# Patient Record
Sex: Female | Born: 1945 | Race: Black or African American | Hispanic: No | Smoking: Former smoker
Health system: Southern US, Community
[De-identification: ages and names within clinical notes are randomized; demographics above are authoritative.]

## PROBLEM LIST (undated history)

## (undated) DIAGNOSIS — E782 Mixed hyperlipidemia: Secondary | ICD-10-CM

## (undated) DIAGNOSIS — R55 Syncope and collapse: Secondary | ICD-10-CM

## (undated) DIAGNOSIS — M109 Gout, unspecified: Secondary | ICD-10-CM

## (undated) DIAGNOSIS — E1165 Type 2 diabetes mellitus with hyperglycemia: Secondary | ICD-10-CM

## (undated) DIAGNOSIS — IMO0001 Reserved for inherently not codable concepts without codable children: Secondary | ICD-10-CM

## (undated) DIAGNOSIS — I10 Essential (primary) hypertension: Secondary | ICD-10-CM

## (undated) DIAGNOSIS — I639 Cerebral infarction, unspecified: Secondary | ICD-10-CM

## (undated) HISTORY — DX: Essential (primary) hypertension: I10

## (undated) HISTORY — DX: Type 2 diabetes mellitus with hyperglycemia: E11.65

## (undated) HISTORY — DX: Mixed hyperlipidemia: E78.2

## (undated) HISTORY — PX: ABDOMINAL HYSTERECTOMY: SHX81

## (undated) HISTORY — DX: Reserved for inherently not codable concepts without codable children: IMO0001

## (undated) HISTORY — PX: TONSILLECTOMY AND ADENOIDECTOMY: SUR1326

## (undated) HISTORY — DX: Syncope and collapse: R55

## (undated) HISTORY — DX: Gout, unspecified: M10.9

---

## 1998-01-10 ENCOUNTER — Encounter: Admission: RE | Admit: 1998-01-10 | Discharge: 1998-04-10 | Payer: Self-pay | Admitting: Family Medicine

## 1999-09-11 ENCOUNTER — Encounter: Payer: Self-pay | Admitting: Orthopaedic Surgery

## 1999-09-11 ENCOUNTER — Ambulatory Visit (HOSPITAL_COMMUNITY): Admission: RE | Admit: 1999-09-11 | Discharge: 1999-09-11 | Payer: Self-pay | Admitting: Orthopaedic Surgery

## 2004-11-01 ENCOUNTER — Other Ambulatory Visit: Admission: RE | Admit: 2004-11-01 | Discharge: 2004-11-01 | Payer: Self-pay | Admitting: Family Medicine

## 2005-12-04 ENCOUNTER — Emergency Department (HOSPITAL_COMMUNITY): Admission: EM | Admit: 2005-12-04 | Discharge: 2005-12-04 | Payer: Self-pay | Admitting: Family Medicine

## 2010-12-12 ENCOUNTER — Emergency Department: Payer: Self-pay | Admitting: Emergency Medicine

## 2012-01-02 ENCOUNTER — Emergency Department: Payer: Self-pay | Admitting: Emergency Medicine

## 2012-01-02 LAB — BASIC METABOLIC PANEL
Anion Gap: 9 (ref 7–16)
BUN: 51 mg/dL — ABNORMAL HIGH (ref 7–18)
Chloride: 102 mmol/L (ref 98–107)
Co2: 25 mmol/L (ref 21–32)
Creatinine: 2.24 mg/dL — ABNORMAL HIGH (ref 0.60–1.30)
EGFR (African American): 26 — ABNORMAL LOW
EGFR (Non-African Amer.): 23 — ABNORMAL LOW
Osmolality: 290 (ref 275–301)
Potassium: 3.6 mmol/L (ref 3.5–5.1)
Sodium: 136 mmol/L (ref 136–145)

## 2012-01-02 LAB — CBC WITH DIFFERENTIAL/PLATELET
Basophil %: 1.2 %
Eosinophil %: 0.5 %
HGB: 11.1 g/dL — ABNORMAL LOW (ref 12.0–16.0)
Lymphocyte %: 20.6 %
MCH: 26.6 pg (ref 26.0–34.0)
MCHC: 33.3 g/dL (ref 32.0–36.0)
MCV: 80 fL (ref 80–100)
Monocyte #: 0.9 x10 3/mm (ref 0.2–0.9)
Monocyte %: 9.4 %
Neutrophil %: 68.3 %
Platelet: 309 10*3/uL (ref 150–440)
RBC: 4.17 10*6/uL (ref 3.80–5.20)
RDW: 14.5 % (ref 11.5–14.5)
WBC: 9.5 10*3/uL (ref 3.6–11.0)

## 2013-06-22 ENCOUNTER — Encounter: Payer: Self-pay | Admitting: *Deleted

## 2013-06-23 ENCOUNTER — Encounter: Payer: Self-pay | Admitting: Cardiovascular Disease

## 2013-06-23 ENCOUNTER — Ambulatory Visit (INDEPENDENT_AMBULATORY_CARE_PROVIDER_SITE_OTHER): Payer: BC Managed Care – PPO | Admitting: Cardiovascular Disease

## 2013-06-23 VITALS — BP 138/82 | HR 87 | Ht 62.0 in | Wt 228.5 lb

## 2013-06-23 DIAGNOSIS — E119 Type 2 diabetes mellitus without complications: Secondary | ICD-10-CM

## 2013-06-23 DIAGNOSIS — R9431 Abnormal electrocardiogram [ECG] [EKG]: Secondary | ICD-10-CM

## 2013-06-23 DIAGNOSIS — E785 Hyperlipidemia, unspecified: Secondary | ICD-10-CM

## 2013-06-23 DIAGNOSIS — I10 Essential (primary) hypertension: Secondary | ICD-10-CM

## 2013-06-23 NOTE — Assessment & Plan Note (Signed)
Emily [name] presents today for further evaluation EKG. EKG in the office is unremarkable. She has some poor R-wave progression but I suspect it is due to lead placement. She also has some evidence of left ventricular hypertrophy. This is most likely related to her long-standing hypertension and also for obesity.  She has numerous risk factors for coronary artery disease including obesity, hypertension, hyperlipidemia, and diabetes mellitus. She has a hemoglobin A1c of 8.8.  We had a  Long discussion about improving her diet and starting an exercise program.  I've given her information on the [facility] low glycemic index diet. I've advised her to stay away from fast foods. We talked about numerous substitutions. I've advised her to stay away from eating anything is white, weak, or sweet.     At this point she does not have any symptoms so have not recommended that we pursue any further testing. Clinically I suspect that she has left ventricular hypertrophy although an echocardiogram to confirm that. At this point, given her lack of symptoms, I do not think that an echocardiogram as needed.

## 2013-06-23 NOTE — Assessment & Plan Note (Signed)
She has an LDL cholesterol of 128.  I've recommended that she start a statin medication. The least expensive statin his simvastatin but she would only be able to take 20 mg of simvastatin since she's currently on diltiazem. She may be up to take atorvastatin which is now generic. I think that an improved diet and weight loss we'll do to help with her hyperlipidemia. She admits to eating a lot of fast foods and fried foods.  I will leave the decision of whether or not to initiate a statin with her medical doctor.

## 2013-06-23 NOTE — Assessment & Plan Note (Signed)
I've recommended that she greatly diminish her salt intake. Further recommendations will be per her medical Dr.

## 2013-06-23 NOTE — Patient Instructions (Addendum)
°  [facility] Low Glycemic Diet °(Source: [facility], 2006) °Low Glycemic Foods (20-49) °(Decrease risk of developing heart disease) °Breakfast Cereals: °All-Bran All-Bran Fruit ‘n Oats °Fiber One Oatmeal (not instant) Oat bran °Fruits and fruit juices: (Limit to 1-2 servings per day) °Apples Apricots (fresh & dried) °Blackberries Blueberries °Cherries Cranberries Peaches Pears Plums Prunes °Grapefruit Raspberries Strawberries Tangerine °Apple juice Grapefruit juice °Tomato juice °Beans and legumes (fresh-cooked): °Black-eyed peas Butter beans °Chick peas Lentils  °Green beans Lima beans Kidney beans Navy beans °Pinto beans Snow peas °Non-starchy vegetables: °Asparagus, avocado, broccoli, cabbage, cauliflower, celery, cucumber, greens, lettuce, mushrooms, peppers, tomatoes, okra, onions, spinach, summer squash °Grains: °Barley Bulgur °Rye Wild rice °Nuts and oils : °Almonds Peanuts Sunflower seeds Hazelnuts Pecans Walnuts °Oils that are liquid at room temperature °Dairy, fish, meat, soy, and eggs: °Milk, skim Lowfat cheese °Yogurt, lowfat, fruit sugar sweetened °Lean red meat Fish  °Skinless chicken & turkey Shellfish °Egg whites (up to 3 daily) Soy products  °Egg yolks (up to 7 or _____ per week) Moderate Glycemic Foods (50-69) °Breakfast Cereals: °Bran Buds Bran Chex °Just Right Mini-Wheats  °Special K Swiss muesli °Fruits: °Banana (under-ripe) Dates °Figs Grapes °Kiwi Mango °Oranges Raisins °Fruit Juices: °Cranberry juice Orange juice °Beans and legumes: °Boston-type baked beans °Canned pinto, kidney, or navy beans °Green peas °Vegetables: °Beets Carrots  °Sweet potato Yam °Corn on the cob °Breads: °Pita (pocket) bread Oat bran bread °Pumpernickel bread Rye bread °Wheat bread, high fiber  °Grains: °Cornmeal Rice, brown Rice, white Couscous °Pasta: °Macaroni Pizza, cheese Ravioli, meat filled Spaghetti, white  °Nuts: °Cashews Macadamia °Snacks: °Chocolate Ice cream, lowfat Muffin  Popcorn High Glycemic Foods (70-100) ° °Breakfast Cereals: °Cheerios Corn Chex °Corn Flakes Cream of Wheat °Grape Nuts Grape Nut Flakes °Grits Nutri-Grain °Puffed Rice Puffed Wheat °Rice Chex Rice Krispies °Shredded Wheat Team °Total °Fruits: °Pineapple Watermelon °Banana (over-ripe) °Beverages: °Sodas, sweet tea, pineapple juice °Vegetables: °Potato, baked, boiled, fried, mashed °French fries °Canned or frozen corn °Parsnips °Winter squash °Breads: °Most breads (white and whole grain) °Bagels Bread sticks °Bread stuffing Kaiser roll °Dinner rolls °Grains: °Rice, instant Tapioca, with milk °Candy and most cookies °Snacks: °Donuts Corn chips Jelly beans Pretzels °Pastries  ° ° ° °

## 2013-06-23 NOTE — Progress Notes (Signed)
     Emily [name] Date of Birth  06/15/1946       [facility] Office [address]  [address] [place], [place]  [address]   [place], [place]  [address] [phone]     [phone]   Fax  [phone]    Fax [phone]  Problem List: 1. Hypertension 2. Diabetes mellitus 3. Abnormal EKG 4. Hyperlipidemia  History of Present Illness:  Emily [name] is a 67 yo with HTN, DM, hyperlipidemia who we are asked to see for an abnormal ECG.   The ECG was preformed as part of a physical exam ( after she told her doctor about a nephew who died of an MI recently).    She denies having any episodes of chest pain or shortness breath. She does not get any regular exercise.  She is retired.  She is able to do all of her normal activities without any chest pain, shortness breath, syncope or presyncope.     Her HbA1C was 8.8 at her last visit.   She takes all her medications as scheduled.  Current Outpatient Prescriptions on File Prior to Visit  Medication Sig Dispense Refill  . diltiazem (CARDIZEM SR) 90 MG 12 hr capsule Take 90 mg by mouth 2 (two) times daily.      . metFORMIN (GLUCOPHAGE) 1000 MG tablet Take 1,000 mg by mouth 2 (two) times daily with a meal.       No current facility-administered medications on file prior to visit.    Allergies  Allergen Reactions  . Enalapril     Past Medical History  Diagnosis Date  . Essential hypertension, benign   . Type II or unspecified type diabetes mellitus without mention of complication, uncontrolled   . Gout, unspecified   . Mixed hyperlipidemia   . Syncope and collapse     Past Surgical History  Procedure Laterality Date  . Abdominal hysterectomy    . Tonsillectomy and adenoidectomy      History  Smoking status  . Former Smoker  Smokeless tobacco  . Not on file    History  Alcohol Use  . Yes    Comment: socially    Family History  Problem Relation Age of  Onset  . Heart disease Mother   . Heart attack Mother   . Hypertension Mother   . Heart disease Father   . Heart attack Father   . Hypertension Father   . Heart disease Brother   . Stroke Brother   . Heart attack Other     Reviw of Systems:  Reviewed in the HPI.  All other systems are negative.  Physical Exam: There were no vitals taken for this visit. General: Well developed, well nourished, in no acute distress.  Head: Normocephalic, atraumatic, sclera non-icteric, mucus membranes are moist,   Neck: Supple. Carotids are 2 + without bruits. No JVD   Lungs: Clear   Heart: RR, normal S1, S2  Abdomen: Soft, non-tender, non-distended with normal bowel sounds.  Msk:  Strength and tone are normal   Extremities: No clubbing or cyanosis. No edema.  Distal pedal pulses are 2+ and equal    Neuro: CN II - XII intact.  Alert and oriented X 3.   Psych:  Normal   ECG: Oct. 1, 2014:   NSR at 87. NS ST abnormality.    Assessment / Plan:

## 2013-12-22 ENCOUNTER — Ambulatory Visit (INDEPENDENT_AMBULATORY_CARE_PROVIDER_SITE_OTHER): Payer: Medicare Other

## 2013-12-22 ENCOUNTER — Encounter: Payer: Self-pay | Admitting: Podiatry

## 2013-12-22 ENCOUNTER — Ambulatory Visit (INDEPENDENT_AMBULATORY_CARE_PROVIDER_SITE_OTHER): Payer: Medicare Other | Admitting: Podiatry

## 2013-12-22 VITALS — Resp 16 | Ht 62.0 in | Wt 229.0 lb

## 2013-12-22 DIAGNOSIS — M79609 Pain in unspecified limb: Secondary | ICD-10-CM

## 2013-12-22 DIAGNOSIS — M79673 Pain in unspecified foot: Secondary | ICD-10-CM

## 2013-12-22 DIAGNOSIS — M84376A Stress fracture, unspecified foot, initial encounter for fracture: Secondary | ICD-10-CM

## 2013-12-22 NOTE — Progress Notes (Signed)
   Subjective:    Patient ID: Emily [name], female    DOB: Apr 30, 1946, [age] y.o.   MRN: [id number]  HPI Comments: Its my right foot that has pain. Its on the lateral side and plantar. It started on Monday and its remained the same. It hurts when i walk, stand and sit. i have taken aleve for the pain.  Foot Pain      Review of Systems  All other systems reviewed and are negative.       Objective:   Physical Exam: I have reviewed her past medical history medications allergies surgeries and social history. Pulses are strongly palpable. Neurologic sensorium is intact. Cutaneous evaluation demonstrates supple well hydrated cutis no erythema edema saline is drainage or odor she does have pain on palpation of the fifth metatarsal of the right foot particularly around the base and the head area. Radiographic evaluation does demonstrate what appears to be a nondisplaced stress fracture of the fifth metatarsal base of the right foot.        Assessment & Plan:  Assessment: Fifth metatarsal base stress fracture Jones location.  Plan: I put her in a Cam Walker and I will followup with her in 3-4 weeks for another set of x-rays

## 2014-01-12 ENCOUNTER — Ambulatory Visit (INDEPENDENT_AMBULATORY_CARE_PROVIDER_SITE_OTHER): Payer: Medicare Other

## 2014-01-12 ENCOUNTER — Ambulatory Visit (INDEPENDENT_AMBULATORY_CARE_PROVIDER_SITE_OTHER): Payer: Medicare Other | Admitting: Podiatry

## 2014-01-12 VITALS — Resp 16 | Ht 62.0 in | Wt 226.0 lb

## 2014-01-12 DIAGNOSIS — M79609 Pain in unspecified limb: Secondary | ICD-10-CM

## 2014-01-12 DIAGNOSIS — M84376A Stress fracture, unspecified foot, initial encounter for fracture: Secondary | ICD-10-CM

## 2014-01-12 DIAGNOSIS — M79673 Pain in unspecified foot: Secondary | ICD-10-CM

## 2014-01-12 NOTE — Progress Notes (Signed)
She presents today for a four-week followup fifth metatarsal stress fracture right foot. States it is a lot better.  Objective: Pulses are palpable right. She has mild hyperpigmentation overlying the fifth metatarsal base of the right foot. Radiographic evaluation does demonstrate stress fracture fifth met base right. Pain on palpation fifth met base right.

## 2014-02-09 ENCOUNTER — Ambulatory Visit (INDEPENDENT_AMBULATORY_CARE_PROVIDER_SITE_OTHER): Payer: Medicare Other

## 2014-02-09 ENCOUNTER — Encounter: Payer: Self-pay | Admitting: Podiatry

## 2014-02-09 ENCOUNTER — Ambulatory Visit (INDEPENDENT_AMBULATORY_CARE_PROVIDER_SITE_OTHER): Payer: Medicare Other | Admitting: Podiatry

## 2014-02-09 VITALS — BP 112/68 | HR 62 | Resp 16

## 2014-02-09 DIAGNOSIS — M8430XA Stress fracture, unspecified site, initial encounter for fracture: Secondary | ICD-10-CM

## 2014-02-09 NOTE — Progress Notes (Signed)
She presents today for followup of a fifth metatarsal fracture of the right foot. She states it feels 100% better.  Objective: Vital signs are stable she is alert and oriented x3. Pulses are palpable bilateral. She has no pain on palpation or of vibratory sensation of the fifth metatarsal base right foot. Radiographs do demonstrate a well-healed fracture.  Assessment: Well-healing fifth metatarsal fracture right foot.  Plan: Followup with me as needed.

## 2014-05-16 ENCOUNTER — Ambulatory Visit: Payer: Self-pay | Admitting: Family Medicine

## 2014-05-24 ENCOUNTER — Ambulatory Visit: Payer: Self-pay | Admitting: Family Medicine

## 2014-06-23 ENCOUNTER — Ambulatory Visit: Payer: Self-pay | Admitting: Family Medicine

## 2014-07-24 ENCOUNTER — Ambulatory Visit: Payer: Self-pay | Admitting: Family Medicine

## 2015-12-21 ENCOUNTER — Encounter: Payer: Self-pay | Admitting: Emergency Medicine

## 2015-12-21 ENCOUNTER — Inpatient Hospital Stay
Admission: EM | Admit: 2015-12-21 | Discharge: 2015-12-26 | DRG: 065 | Disposition: A | Discharge status: Home Health | Payer: Medicare Other | Attending: Internal Medicine | Admitting: Internal Medicine

## 2015-12-21 ENCOUNTER — Emergency Department: Payer: Medicare Other

## 2015-12-21 DIAGNOSIS — I10 Essential (primary) hypertension: Secondary | ICD-10-CM | POA: Diagnosis present

## 2015-12-21 DIAGNOSIS — Z823 Family history of stroke: Secondary | ICD-10-CM

## 2015-12-21 DIAGNOSIS — R778 Other specified abnormalities of plasma proteins: Secondary | ICD-10-CM

## 2015-12-21 DIAGNOSIS — R4182 Altered mental status, unspecified: Secondary | ICD-10-CM

## 2015-12-21 DIAGNOSIS — E860 Dehydration: Secondary | ICD-10-CM | POA: Diagnosis present

## 2015-12-21 DIAGNOSIS — R7989 Other specified abnormal findings of blood chemistry: Secondary | ICD-10-CM | POA: Diagnosis present

## 2015-12-21 DIAGNOSIS — I639 Cerebral infarction, unspecified: Secondary | ICD-10-CM | POA: Diagnosis not present

## 2015-12-21 DIAGNOSIS — K5900 Constipation, unspecified: Secondary | ICD-10-CM | POA: Diagnosis present

## 2015-12-21 DIAGNOSIS — R55 Syncope and collapse: Secondary | ICD-10-CM | POA: Diagnosis present

## 2015-12-21 DIAGNOSIS — E871 Hypo-osmolality and hyponatremia: Secondary | ICD-10-CM | POA: Diagnosis present

## 2015-12-21 DIAGNOSIS — R479 Unspecified speech disturbances: Secondary | ICD-10-CM

## 2015-12-21 DIAGNOSIS — Z8249 Family history of ischemic heart disease and other diseases of the circulatory system: Secondary | ICD-10-CM

## 2015-12-21 DIAGNOSIS — Z7984 Long term (current) use of oral hypoglycemic drugs: Secondary | ICD-10-CM

## 2015-12-21 DIAGNOSIS — M109 Gout, unspecified: Secondary | ICD-10-CM | POA: Diagnosis present

## 2015-12-21 DIAGNOSIS — Z87891 Personal history of nicotine dependence: Secondary | ICD-10-CM

## 2015-12-21 DIAGNOSIS — R4189 Other symptoms and signs involving cognitive functions and awareness: Secondary | ICD-10-CM | POA: Diagnosis present

## 2015-12-21 DIAGNOSIS — Z6841 Body Mass Index (BMI) 40.0 and over, adult: Secondary | ICD-10-CM

## 2015-12-21 DIAGNOSIS — I63533 Cerebral infarction due to unspecified occlusion or stenosis of bilateral posterior cerebral arteries: Secondary | ICD-10-CM | POA: Diagnosis present

## 2015-12-21 DIAGNOSIS — E782 Mixed hyperlipidemia: Secondary | ICD-10-CM | POA: Diagnosis present

## 2015-12-21 DIAGNOSIS — Z9114 Patient's other noncompliance with medication regimen: Secondary | ICD-10-CM

## 2015-12-21 DIAGNOSIS — E1165 Type 2 diabetes mellitus with hyperglycemia: Secondary | ICD-10-CM | POA: Diagnosis present

## 2015-12-21 DIAGNOSIS — I63511 Cerebral infarction due to unspecified occlusion or stenosis of right middle cerebral artery: Secondary | ICD-10-CM | POA: Diagnosis not present

## 2015-12-21 LAB — COMPREHENSIVE METABOLIC PANEL
ALK PHOS: 77 U/L (ref 38–126)
ALT: 12 U/L — ABNORMAL LOW (ref 14–54)
ANION GAP: 8 (ref 5–15)
AST: 16 U/L (ref 15–41)
Albumin: 3.7 g/dL (ref 3.5–5.0)
BILIRUBIN TOTAL: 0.8 mg/dL (ref 0.3–1.2)
BUN: 24 mg/dL — AB (ref 6–20)
CALCIUM: 9.3 mg/dL (ref 8.9–10.3)
CO2: 20 mmol/L — ABNORMAL LOW (ref 22–32)
Chloride: 105 mmol/L (ref 101–111)
Creatinine, Ser: 0.92 mg/dL (ref 0.44–1.00)
GFR calc Af Amer: >60 mL/min (ref >60)
Glucose, Bld: 353 mg/dL — ABNORMAL HIGH (ref 65–99)
POTASSIUM: 3.5 mmol/L (ref 3.5–5.1)
Sodium: 133 mmol/L — ABNORMAL LOW (ref 135–145)
TOTAL PROTEIN: 7.6 g/dL (ref 6.5–8.1)

## 2015-12-21 LAB — CBC WITH DIFFERENTIAL/PLATELET
BASOS ABS: 0.1 10*3/uL (ref 0–0.1)
BASOS PCT: 1 %
EOS ABS: 0.1 10*3/uL (ref 0–0.7)
Eosinophils Relative: 2 %
HEMATOCRIT: 45.5 % (ref 35.0–47.0)
Hemoglobin: 15.3 g/dL (ref 12.0–16.0)
Lymphocytes Relative: 32 %
Lymphs Abs: 2 10*3/uL (ref 1.0–3.6)
MCH: 27.9 pg (ref 26.0–34.0)
MCHC: 33.6 g/dL (ref 32.0–36.0)
MCV: 82.9 fL (ref 80.0–100.0)
MONO ABS: 0.5 10*3/uL (ref 0.2–0.9)
Monocytes Relative: 9 %
NEUTROS ABS: 3.5 10*3/uL (ref 1.4–6.5)
Neutrophils Relative %: 56 %
Platelets: 203 10*3/uL (ref 150–440)
RBC: 5.48 MIL/uL — ABNORMAL HIGH (ref 3.80–5.20)
RDW: 13.2 % (ref 11.5–14.5)
WBC: 6.2 10*3/uL (ref 3.6–11.0)

## 2015-12-21 LAB — TROPONIN I
TROPONIN I: 0.04 ng/mL — AB (ref <0.031)
Troponin I: 0.1 ng/mL — ABNORMAL HIGH (ref <0.031)

## 2015-12-21 LAB — GLUCOSE, CAPILLARY
GLUCOSE-CAPILLARY: 347 mg/dL — AB (ref 65–99)
Glucose-Capillary: 326 mg/dL — ABNORMAL HIGH (ref 65–99)

## 2015-12-21 MED ORDER — ACETAMINOPHEN 325 MG PO TABS
650.0000 mg | ORAL_TABLET | Freq: Four times a day (QID) | ORAL | Status: DC | PRN
  Filled 2015-12-21: qty 2

## 2015-12-21 MED ORDER — DILTIAZEM HCL ER 90 MG PO CP12
90.0000 mg | ORAL_CAPSULE | Freq: Two times a day (BID) | ORAL | Status: DC
  Administered 2015-12-22 – 2015-12-26 (×10): 90 mg via ORAL
  Filled 2015-12-21 (×11): qty 1

## 2015-12-21 MED ORDER — ONDANSETRON HCL 4 MG/2ML IJ SOLN: 4.0000 mg | Freq: Four times a day (QID) | INTRAMUSCULAR | Status: DC | PRN

## 2015-12-21 MED ORDER — ACETAMINOPHEN 650 MG RE SUPP: 650.0000 mg | Freq: Four times a day (QID) | RECTAL | Status: DC | PRN

## 2015-12-21 MED ORDER — ASPIRIN 81 MG PO CHEW
324.0000 mg | CHEWABLE_TABLET | Freq: Once | ORAL | Status: AC
  Administered 2015-12-21: 324 mg via ORAL
  Filled 2015-12-21: qty 4

## 2015-12-21 MED ORDER — PRAVASTATIN SODIUM 40 MG PO TABS
40.0000 mg | ORAL_TABLET | Freq: Every day | ORAL | Status: DC
  Administered 2015-12-22: 40 mg via ORAL
  Filled 2015-12-21: qty 1

## 2015-12-21 MED ORDER — INSULIN ASPART 100 UNIT/ML ~~LOC~~ SOLN
0.0000 [IU] | Freq: Three times a day (TID) | SUBCUTANEOUS | Status: DC
  Administered 2015-12-22: 3 [IU] via SUBCUTANEOUS
  Administered 2015-12-22: 17:00:00 7 [IU] via SUBCUTANEOUS
  Administered 2015-12-22: 13:00:00 5 [IU] via SUBCUTANEOUS
  Administered 2015-12-23 (×2): 3 [IU] via SUBCUTANEOUS
  Administered 2015-12-23 – 2015-12-24 (×2): 5 [IU] via SUBCUTANEOUS
  Administered 2015-12-24 – 2015-12-25 (×4): 3 [IU] via SUBCUTANEOUS
  Administered 2015-12-25: 12:00:00 5 [IU] via SUBCUTANEOUS
  Administered 2015-12-26: 13:00:00 3 [IU] via SUBCUTANEOUS
  Filled 2015-12-21 (×2): qty 3
  Filled 2015-12-21: qty 5
  Filled 2015-12-21 (×4): qty 3
  Filled 2015-12-21: qty 7
  Filled 2015-12-21 (×2): qty 3
  Filled 2015-12-21 (×2): qty 5

## 2015-12-21 MED ORDER — SODIUM CHLORIDE 0.9% FLUSH
3.0000 mL | Freq: Two times a day (BID) | INTRAVENOUS | Status: DC
  Administered 2015-12-22 – 2015-12-26 (×10): 3 mL via INTRAVENOUS

## 2015-12-21 MED ORDER — ENOXAPARIN SODIUM 40 MG/0.4ML ~~LOC~~ SOLN
40.0000 mg | Freq: Two times a day (BID) | SUBCUTANEOUS | Status: DC
  Administered 2015-12-22 – 2015-12-26 (×10): 40 mg via SUBCUTANEOUS
  Filled 2015-12-21 (×10): qty 0.4

## 2015-12-21 MED ORDER — METFORMIN HCL 500 MG PO TABS
500.0000 mg | ORAL_TABLET | Freq: Two times a day (BID) | ORAL | Status: DC
  Filled 2015-12-21: qty 2

## 2015-12-21 MED ORDER — HYDRALAZINE HCL 20 MG/ML IJ SOLN: 10.0000 mg | Freq: Four times a day (QID) | INTRAMUSCULAR | Status: DC | PRN

## 2015-12-21 MED ORDER — INSULIN ASPART 100 UNIT/ML ~~LOC~~ SOLN
0.0000 [IU] | Freq: Every day | SUBCUTANEOUS | Status: DC
  Administered 2015-12-21 – 2015-12-22 (×2): 4 [IU] via SUBCUTANEOUS
  Administered 2015-12-23: 2 [IU] via SUBCUTANEOUS
  Administered 2015-12-24: 3 [IU] via SUBCUTANEOUS
  Administered 2015-12-25: 23:00:00 2 [IU] via SUBCUTANEOUS
  Filled 2015-12-21: qty 5
  Filled 2015-12-21: qty 3
  Filled 2015-12-21: qty 2
  Filled 2015-12-21: qty 5
  Filled 2015-12-21: qty 4
  Filled 2015-12-21: qty 2

## 2015-12-21 MED ORDER — HYDRALAZINE HCL 20 MG/ML IJ SOLN
INTRAMUSCULAR | Status: AC
  Administered 2015-12-21: 20 mg
  Filled 2015-12-21: qty 1

## 2015-12-21 MED ORDER — NITROGLYCERIN 2 % TD OINT
0.5000 [in_us] | TOPICAL_OINTMENT | Freq: Once | TRANSDERMAL | Status: AC
  Administered 2015-12-21: 0.5 [in_us] via TOPICAL
  Filled 2015-12-21: qty 1

## 2015-12-21 MED ORDER — ONDANSETRON HCL 4 MG PO TABS: 4.0000 mg | ORAL_TABLET | Freq: Four times a day (QID) | ORAL | Status: DC | PRN

## 2015-12-21 NOTE — ED Notes (Signed)
Pt talking to specialist on call

## 2015-12-21 NOTE — ED Notes (Addendum)
Pt was at funeral and per EMS visitors report pt went unresponsive. Pt not speaking and not following commands but will make correct movements to help get dressed/undressed, etc. When unresponsive was able to scratch face per EMS

## 2015-12-21 NOTE — ED Notes (Signed)
Admitting MD at bedside.

## 2015-12-21 NOTE — ED Notes (Signed)
Dr Darnelle Catalanmalinda notified troponin 0.04

## 2015-12-21 NOTE — ED Provider Notes (Signed)
[facility] Emergency Department Provider Note  ____________________________________________  Time seen: Approximately 5:09 PM  I have reviewed the triage vital signs and the nursing notes.   HISTORY  Chief Complaint Altered Mental Status    HPI Emily [name] is a 70 y.o. 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 Patient has had 1 or 2 episodes in the past where she either passed out or felt like she would pass out kind of ended up just staring and sitting one previous episode that was witnessed lasted long enough to further her family to get her something to eat and drink and she gradually came out of it. Patient at present is still confused she'll follow some commands but not others answer some questions but not others. She denies having any pain. Fingerstick was 226 on arrival. Past medical history is significant for hypertension diabetes and gout.  ----------------------------------------- 8:40 PM on 12/21/2015 -----------------------------------------  Patient is now awake alert just slightly slow with her speech she is moving all extremities equally and well neuro exam now is nonfocal and she is able to comply with everything. Still waiting for [facility] because there is a problem with connection. Her troponin is going up however we will admit her. Past Medical History  Diagnosis Date  . Essential hypertension, benign   . Type II or unspecified type diabetes mellitus without mention of complication, uncontrolled   . Gout, unspecified   . Mixed hyperlipidemia   . Syncope and collapse     Patient Active Problem List   Diagnosis Date Noted  . Hyperlipidemia 06/23/2013  . DM (diabetes mellitus) (HCC) 06/23/2013  . HTN  (hypertension) 06/23/2013  . Morbid obesity (HCC) 06/23/2013  . Abnormal ECG 06/23/2013    Past Surgical History  Procedure Laterality Date  . Abdominal hysterectomy    . Tonsillectomy and adenoidectomy      Current Outpatient Rx  Name  Route  Sig  Dispense  Refill  . diltiazem (CARDIZEM SR) 90 MG 12 hr capsule   Oral   Take 90 mg by mouth 2 (two) times daily.         Marland Kitchen glimepiride (AMARYL) 2 MG tablet   Oral   Take 2 mg by mouth daily with breakfast.         . metFORMIN (GLUCOPHAGE) 1000 MG tablet   Oral   Take 1,000 mg by mouth 2 (two) times daily with a meal.         . pravastatin (PRAVACHOL) 40 MG tablet   Oral   Take 40 mg by mouth daily.           Allergies Enalapril  Family History  Problem Relation Age of Onset  . Heart disease Mother   . Heart attack Mother   . Hypertension Mother   . Heart disease Father   . Heart attack Father   . Hypertension Father   . Heart disease Brother   . Stroke Brother   . Heart attack Other     Social History Social History  Substance Use Topics  . Smoking status: Former Games developer  . Smokeless tobacco: None  . Alcohol Use: Yes     Comment: socially    Review of Systems  Constitutional: No fever/chills Eyes: No visual changes. ENT: No sore throat. Cardiovascular: Denies chest pain. Respiratory: Denies shortness of breath. Gastrointestinal: No abdominal pain.  No nausea, no vomiting.  No diarrhea.  No constipation. Genitourinary: Negative for dysuria. Musculoskeletal: Negative for back pain. Skin: Negative for rash. Neurological: Negative for headaches, focal weakness or numbness.  10-point ROS otherwise negative.  ____________________________________________   PHYSICAL EXAM:  VITAL SIGNS: ED Triage Vitals  Enc Vitals Group     BP 12/21/15 1640 142/52 mmHg     Pulse Rate 12/21/15 1640 71     Resp 12/21/15 1640 16     Temp 12/21/15 1640 97.5 F (36.4 C)     Temp Source 12/21/15 1640 Oral      SpO2 12/21/15 1640 97 %     Weight 12/21/15 1640 228 lb (103.42 kg)     Height --      Head Cir --      Peak Flow --      Pain Score --      Pain Loc --      Pain Edu? --      Excl. in GC? --     Constitutional: Alert and oriented to hospital but not the name of the hospital cannot tell me the day or date.. Well appearing and in no acute distress. Eyes: Conjunctivae are normal. PERRL. EOMI. Head: Atraumatic. Nose: No congestion/rhinnorhea. Mouth/Throat: Mucous membranes are moist.  Oropharynx non-erythematous. Neck: No stridor. Cardiovascular: Normal rate, regular rhythm. Grossly normal heart sounds.  Good peripheral circulation. Respiratory: Normal respiratory effort.  No retractions. Lungs CTAB. Gastrointestinal: Soft and nontender. No distention. No abdominal bruits. No CVA tenderness. }Musculoskeletal: No lower extremity tenderness nor edema.  No joint effusions. Neurologic:  Patient appears somewhat confused again will say some words very clearly remembers mumbles others does not know the day or date appears to be easily confused cannot follow all commands but moves all extremities equally and well can help getting undressed out of her close in as a hospital gown. Appears to be post ictal. Skin:  Skin is warm, dry and intact. No rash noted.   ____________________________________________   LABS (all labs ordered are listed, but only abnormal results are displayed)  Labs Reviewed  COMPREHENSIVE METABOLIC PANEL - Abnormal; Notable for the following:    Sodium 133 (*)    CO2 20 (*)    Glucose, Bld 353 (*)    BUN 24 (*)    ALT 12 (*)    All other components within normal limits  TROPONIN I - Abnormal; Notable for the following:    Troponin I 0.04 (*)    All other components within normal limits  CBC WITH DIFFERENTIAL/PLATELET - Abnormal; Notable for the following:    RBC 5.48 (*)    All other components within normal limits  GLUCOSE, CAPILLARY - Abnormal; Notable for the  following:    Glucose-Capillary 326 (*)    All other components within normal limits  TROPONIN I - Abnormal; Notable for the following:    Troponin I 0.10 (*)    All other components within normal limits   ____________________________________________  EKG  EKG read and interpreted by me shows normal sinus rhythm rate of 71 left axis nonspecific ST-T wave changes ____________________________________________  RADIOLOGY  CT is read as no acute changes there are old strokes present Chest x-ray is read as no acute changes please note both of these studies were read by radiology ____________________________________________   PROCEDURES  ____________________________________________   INITIAL IMPRESSION / ASSESSMENT AND PLAN / ED COURSE  Pertinent labs & imaging results that were available during my care of the patient were reviewed by me and considered in my medical decision making (see chart for details).   ____________________________________________   FINAL CLINICAL IMPRESSION(S) / ED DIAGNOSES  Final diagnoses:  Altered mental status, unspecified altered mental status type  Elevated troponin   Actual diagnosis is altered mental status almost completely resolved probable seizure and Rising troponin    Arnaldo NatalPaul F Opal Dinning, MD 12/21/15 2042

## 2015-12-21 NOTE — ED Notes (Signed)
Per cousin pt kind of just went unresponsive like passed out.  She was out for 10 min per cousin. Pt tracking and will only follow certain commands; pt will stick out tongue but not do thumbs up on either side.  Will move all extremities. Pt had just eaten before episode. Pt was able to put arms through gown to get dressed without being prompted and able to make correct movements to get undressed without prompting.

## 2015-12-21 NOTE — ED Notes (Signed)
Pt starting to talk with family and ask questions about other family.  When pt asked questions not answering correctly or sometimes does not answer.

## 2015-12-21 NOTE — ED Notes (Signed)
Patient transported to CT 

## 2015-12-21 NOTE — H&P (Signed)
[facility] - [place] at [facility]   PATIENT NAME: Emily [name]    MR#:  [id number]  DATE OF BIRTH:  Jun 06, 1946  DATE OF ADMISSION:  12/21/2015  PRIMARY CARE PHYSICIAN: Wonda Cheng, MD   REQUESTING/REFERRING PHYSICIAN: Dr. Dorothea Glassman  CHIEF COMPLAINT:   Chief Complaint  Patient presents with  . Altered Mental Status    HISTORY OF PRESENT ILLNESS:  Emily [name]  is a 70 y.o. female with a known history of essential hypertension, gout, hyperlipidemia previous history of syncope who presents to the hospital due to syncope and altered mental status. Says she was at church serving some food when she had a syncopal episode.  Patient does not remember her syncopal episode and denies any prodromal symptoms like chest pain, palpitations, nausea, vomiting or any other associated symptoms. She does say about a year ago she had a similar episode. She presented to the emergency room she was somewhat confused in a postictal state as per the ER physician but her mental status has slowly improved and she is back to baseline now. She was noted to be slightly hypertensive. She denies any other associated symptoms presently.   PAST MEDICAL HISTORY:   Past Medical History  Diagnosis Date  . Essential hypertension, benign   . Type II or unspecified type diabetes mellitus without mention of complication, uncontrolled   . Gout, unspecified   . Mixed hyperlipidemia   . Syncope and collapse     PAST SURGICAL HISTORY:   Past Surgical History  Procedure Laterality Date  . Abdominal hysterectomy    . Tonsillectomy and adenoidectomy      SOCIAL HISTORY:   Social History  Substance Use Topics  . Smoking status: Former Games developer  . Smokeless tobacco: Not on file  . Alcohol Use: Yes     Comment: socially    FAMILY HISTORY:   Family History  Problem Relation Age of Onset  . Heart disease Mother   . Heart attack Mother   . Hypertension Mother   . Heart  disease Father   . Heart attack Father   . Hypertension Father   . Heart disease Brother   . Stroke Brother   . Heart attack Other     DRUG ALLERGIES:   Allergies  Allergen Reactions  . Enalapril     REVIEW OF SYSTEMS:   Review of Systems  Constitutional: Negative for fever and weight loss.  HENT: Negative for congestion, nosebleeds and tinnitus.   Eyes: Negative for blurred vision, double vision and redness.  Respiratory: Negative for cough, hemoptysis and shortness of breath.   Cardiovascular: Negative for chest pain, orthopnea, leg swelling and PND.  Gastrointestinal: Negative for nausea, vomiting, abdominal pain, diarrhea and melena.  Genitourinary: Negative for dysuria, urgency and hematuria.  Musculoskeletal: Negative for joint pain and falls.  Neurological: Negative for dizziness, tingling, sensory change, focal weakness, seizures, weakness and headaches.  Endo/Heme/Allergies: Negative for polydipsia. Does not bruise/bleed easily.  Psychiatric/Behavioral: Negative for depression and memory loss. The patient is not nervous/anxious.     MEDICATIONS AT HOME:   Prior to Admission medications   Medication Sig Start Date End Date Taking? Authorizing Provider  diltiazem (CARDIZEM SR) 90 MG 12 hr capsule Take 90 mg by mouth 2 (two) times daily.   Yes Historical Provider, MD  metFORMIN (GLUCOPHAGE) 500 MG tablet Take 500-1,000 mg by mouth 2 (two) times daily with a meal. Take  in morning and  every evening   Yes Historical Provider,  MD  pravastatin (PRAVACHOL) 40 MG tablet Take 40 mg by mouth daily.   Yes Historical Provider, MD      VITAL SIGNS:  Blood pressure 182/84, pulse 78, temperature 98.1 F (36.7 C), temperature source Oral, resp. rate 21, weight 103.42 kg (228 lb), SpO2 97 %.  PHYSICAL EXAMINATION:  Physical Exam  GENERAL:  70 y.o.-year-old patient lying in the bed with no acute distress.  EYES: Pupils equal, round, reactive to light and  accommodation. No scleral icterus. Extraocular muscles intact.  HEENT: Head atraumatic, normocephalic. Oropharynx and nasopharynx clear. No oropharyngeal erythema, moist oral mucosa  NECK:  Supple, no jugular venous distention. No thyroid enlargement, no tenderness.  LUNGS: Normal breath sounds bilaterally, no wheezing, rales, rhonchi. No use of accessory muscles of respiration.  CARDIOVASCULAR: S1, S2 RRR. No murmurs, rubs, gallops, clicks.  ABDOMEN: Soft, nontender, nondistended. Bowel sounds present. No organomegaly or mass.  EXTREMITIES: No pedal edema, cyanosis, or clubbing. + 2 pedal & radial pulses b/l.   NEUROLOGIC: Cranial nerves II through XII are intact. No focal Motor or sensory deficits appreciated b/l PSYCHIATRIC: The patient is alert and oriented x 3. Good affect.  SKIN: No obvious rash, lesion, or ulcer.   LABORATORY PANEL:   CBC  Recent Labs Lab 12/21/15 1643  WBC 6.2  HGB 15.3  HCT 45.5  PLT 203   ------------------------------------------------------------------------------------------------------------------  Chemistries   Recent Labs Lab 12/21/15 1643  NA 133*  K 3.5  CL 105  CO2 20*  GLUCOSE 353*  BUN 24*  CREATININE 0.92  CALCIUM 9.3  AST 16  ALT 12*  ALKPHOS 77  BILITOT 0.8   ------------------------------------------------------------------------------------------------------------------  Cardiac Enzymes  Recent Labs Lab 12/21/15 1838  TROPONINI 0.10*   ------------------------------------------------------------------------------------------------------------------  RADIOLOGY:  Ct Head Wo Contrast  12/21/2015  CLINICAL DATA:  Syncope.  History of hypertension and diabetes. EXAM: CT HEAD WITHOUT CONTRAST TECHNIQUE: Contiguous axial images were obtained from the base of the skull through the vertex without intravenous contrast. COMPARISON:  None. FINDINGS: The ventricles are normal in configuration. 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 Hypoattenuation in the right caudate nucleus head is consistent with an old lacune infarct. There are no parenchymal masses or mass effect. There is no evidence of a recent cortical infarct. Patchy areas of white matter hypoattenuation are noted consistent with mild chronic microvascular ischemic change, more apparent on the left. There are no extra-axial masses or abnormal fluid collections. There is no intracranial hemorrhage. Visualized sinuses and mastoid air cells are clear. IMPRESSION: 1. No acute intracranial abnormalities. 2. 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 Electronically Signed   By: Amie Portlandavid  Ormond M.D.   On: 12/21/2015 17:30   Dg Chest Portable 1 View  12/21/2015  CLINICAL DATA:  Unresponsive EXAM: PORTABLE CHEST 1 VIEW COMPARISON:  None. FINDINGS: Cardiac shadow is within normal limits. The lungs are well aerated bilaterally. No acute bony abnormality is seen. IMPRESSION: No active disease. Electronically Signed   By: Alcide CleverMark  Lukens M.D.   On: 12/21/2015 17:38     IMPRESSION AND PLAN:   70 year old female with past medical history of hypertension, previous history of syncope, gout, diabetes type 2 without complication, hyperlipidemia who presents to the hospital after a syncopal episode.  #1 syncope-etiology unclear presently. -Patient had no prodromal symptoms prior to her syncopal episode. She has a mildly elevated troponin. -I will observe him on telemetry, cycle her cardiac markers.  Check a two-dimensional echocardiogram, get a carotid duplex. We'll also get a cardiology consult.  #2 altered mental status-patient had a cough and mental status/post ictal state post syncopal episode. Her mental status has slowly improved and is back to baseline. -We'll get a neurology consult. Her CT head is currently negative. There is no evidence of any acute focal metabolic or infectious source  presently.  #3 diabetes type 2 without complication-continue metformin.  #4 hyperlipidemia-continue Pravachol.  #5 Accelerated hypertension-continue Cardizem.  - PRN Hydralazine.    All the records are reviewed and case discussed with ED provider. Management plans discussed with the patient, family and they are in agreement.  CODE STATUS: Full  TOTAL TIME TAKING CARE OF THIS PATIENT: 45 minutes.    Houston Siren M.D on 12/21/2015 at 9:35 PM  Between 7am to 6pm - Pager - [phone]  After 6pm go to [email or web address] - password [email or web address] [facility]  [facility]  Office  [phone]  CC: Primary care physician; Wonda Cheng, MD

## 2015-12-22 ENCOUNTER — Inpatient Hospital Stay: Payer: Medicare Other

## 2015-12-22 ENCOUNTER — Encounter: Payer: Self-pay | Admitting: Radiology

## 2015-12-22 ENCOUNTER — Observation Stay: Payer: Medicare Other

## 2015-12-22 ENCOUNTER — Observation Stay
Admit: 2015-12-22 | Discharge: 2015-12-22 | Disposition: A | Discharge status: Home / Self Care | Payer: Medicare Other | Attending: Specialist | Admitting: Specialist

## 2015-12-22 DIAGNOSIS — I639 Cerebral infarction, unspecified: Secondary | ICD-10-CM | POA: Diagnosis present

## 2015-12-22 DIAGNOSIS — I63511 Cerebral infarction due to unspecified occlusion or stenosis of right middle cerebral artery: Secondary | ICD-10-CM | POA: Diagnosis present

## 2015-12-22 DIAGNOSIS — Z823 Family history of stroke: Secondary | ICD-10-CM | POA: Diagnosis not present

## 2015-12-22 DIAGNOSIS — Z8249 Family history of ischemic heart disease and other diseases of the circulatory system: Secondary | ICD-10-CM | POA: Diagnosis not present

## 2015-12-22 DIAGNOSIS — I63533 Cerebral infarction due to unspecified occlusion or stenosis of bilateral posterior cerebral arteries: Secondary | ICD-10-CM | POA: Diagnosis present

## 2015-12-22 DIAGNOSIS — I10 Essential (primary) hypertension: Secondary | ICD-10-CM | POA: Diagnosis present

## 2015-12-22 DIAGNOSIS — Z6841 Body Mass Index (BMI) 40.0 and over, adult: Secondary | ICD-10-CM | POA: Diagnosis not present

## 2015-12-22 DIAGNOSIS — E860 Dehydration: Secondary | ICD-10-CM | POA: Diagnosis present

## 2015-12-22 DIAGNOSIS — E1165 Type 2 diabetes mellitus with hyperglycemia: Secondary | ICD-10-CM | POA: Diagnosis present

## 2015-12-22 DIAGNOSIS — M109 Gout, unspecified: Secondary | ICD-10-CM | POA: Diagnosis present

## 2015-12-22 DIAGNOSIS — Z7984 Long term (current) use of oral hypoglycemic drugs: Secondary | ICD-10-CM | POA: Diagnosis not present

## 2015-12-22 DIAGNOSIS — Z9114 Patient's other noncompliance with medication regimen: Secondary | ICD-10-CM | POA: Diagnosis not present

## 2015-12-22 DIAGNOSIS — K5900 Constipation, unspecified: Secondary | ICD-10-CM | POA: Diagnosis present

## 2015-12-22 DIAGNOSIS — R55 Syncope and collapse: Secondary | ICD-10-CM

## 2015-12-22 DIAGNOSIS — E782 Mixed hyperlipidemia: Secondary | ICD-10-CM | POA: Diagnosis present

## 2015-12-22 DIAGNOSIS — Z87891 Personal history of nicotine dependence: Secondary | ICD-10-CM | POA: Diagnosis not present

## 2015-12-22 DIAGNOSIS — R7989 Other specified abnormal findings of blood chemistry: Secondary | ICD-10-CM | POA: Diagnosis present

## 2015-12-22 DIAGNOSIS — R4189 Other symptoms and signs involving cognitive functions and awareness: Secondary | ICD-10-CM | POA: Diagnosis present

## 2015-12-22 DIAGNOSIS — E871 Hypo-osmolality and hyponatremia: Secondary | ICD-10-CM | POA: Diagnosis present

## 2015-12-22 LAB — TROPONIN I
Troponin I: <0.03 ng/mL (ref <0.031)
Troponin I: <0.03 ng/mL (ref <0.031)
Troponin I: <0.03 ng/mL (ref <0.031)

## 2015-12-22 LAB — GLUCOSE, CAPILLARY
GLUCOSE-CAPILLARY: 248 mg/dL — AB (ref 65–99)
GLUCOSE-CAPILLARY: 269 mg/dL — AB (ref 65–99)
GLUCOSE-CAPILLARY: 307 mg/dL — AB (ref 65–99)
GLUCOSE-CAPILLARY: 330 mg/dL — AB (ref 65–99)

## 2015-12-22 LAB — ECHOCARDIOGRAM COMPLETE
HEIGHTINCHES: 62.008 in
WEIGHTICAEL: 3648 [oz_av]

## 2015-12-22 MED ORDER — ASPIRIN 81 MG PO CHEW
81.0000 mg | CHEWABLE_TABLET | Freq: Every day | ORAL | Status: DC
  Administered 2015-12-22: 13:00:00 81 mg via ORAL
  Filled 2015-12-22: qty 1

## 2015-12-22 MED ORDER — SODIUM CHLORIDE 0.9 % IV SOLN
INTRAVENOUS | Status: DC
  Administered 2015-12-22: 16:00:00 via INTRAVENOUS

## 2015-12-22 MED ORDER — METFORMIN HCL 500 MG PO TABS
500.0000 mg | ORAL_TABLET | Freq: Every day | ORAL | Status: DC
  Administered 2015-12-22: 08:00:00 500 mg via ORAL

## 2015-12-22 MED ORDER — METFORMIN HCL 500 MG PO TABS: 1000.0000 mg | ORAL_TABLET | Freq: Every day | ORAL | Status: DC

## 2015-12-22 MED ORDER — IOPAMIDOL (ISOVUE-370) INJECTION 76%
100.0000 mL | Freq: Once | INTRAVENOUS | Status: AC | PRN
  Administered 2015-12-22: 17:00:00 100 mL via INTRAVENOUS

## 2015-12-22 MED ORDER — ATORVASTATIN CALCIUM 20 MG PO TABS
40.0000 mg | ORAL_TABLET | Freq: Every day | ORAL | Status: DC
  Administered 2015-12-22 – 2015-12-26 (×5): 40 mg via ORAL
  Filled 2015-12-22 (×5): qty 2

## 2015-12-22 MED ORDER — INSULIN GLARGINE 100 UNIT/ML ~~LOC~~ SOLN
10.0000 [IU] | Freq: Every day | SUBCUTANEOUS | Status: DC
  Administered 2015-12-22 – 2015-12-23 (×2): 10 [IU] via SUBCUTANEOUS
  Filled 2015-12-22 (×3): qty 0.1

## 2015-12-22 MED ORDER — ASPIRIN EC 81 MG PO TBEC: 81.0000 mg | DELAYED_RELEASE_TABLET | Freq: Every day | ORAL | Status: DC

## 2015-12-22 MED ORDER — ASPIRIN EC 325 MG PO TBEC
325.0000 mg | DELAYED_RELEASE_TABLET | Freq: Every day | ORAL | Status: DC
  Administered 2015-12-22 – 2015-12-23 (×2): 325 mg via ORAL
  Filled 2015-12-22 (×2): qty 1

## 2015-12-22 NOTE — Consult Note (Signed)
[facility] Cardiology  CARDIOLOGY CONSULT NOTE  Patient ID: Emily [name] MRN: [id number] DOB/AGE: 10-08-1945 70 y.o.  Admit date: 12/21/2015 Referring Physician Cherlynn Kaiser Primary Physician [facility] Primary Cardiologist  Reason for Consultation Syncope  HPI: 70 year old female referred for evaluation of syncope. The patient was at church, serving food, apparently experienced a syncopal episode. Patient was brought to [facility] emergency room where she appeared to be confused. Head CT did not reveal any acute infarct. Carotid ultrasounds were unremarkable. Today, patient still appeared confused, able to answer questions with a yes or no, without going into any specific detail. EKG revealed normal sinus rhythm. Telemetry reveals sinus rhythm at 83 bpm. The patient denies chest pain, shortness of breath, palpitations, heart racing or peripheral edema.  Review of systems complete and found to be negative unless listed above     Past Medical History  Diagnosis Date  . Essential hypertension, benign   . Type II or unspecified type diabetes mellitus without mention of complication, uncontrolled   . Gout, unspecified   . Mixed hyperlipidemia   . Syncope and collapse     Past Surgical History  Procedure Laterality Date  . Abdominal hysterectomy    . Tonsillectomy and adenoidectomy      Prescriptions prior to admission  Medication Sig Dispense Refill Last Dose  . diltiazem (CARDIZEM SR) 90 MG 12 hr capsule Take 90 mg by mouth 2 (two) times daily.   unknown at unknown  . metFORMIN (GLUCOPHAGE) 500 MG tablet Take 500-1,000 mg by mouth 2 (two) times daily with a meal. Take  in morning and  every evening   unknown at unknown  . pravastatin (PRAVACHOL) 40 MG tablet Take 40 mg by mouth daily.   unknown at unknown   Social History   Social History  . Marital Status: Unknown    Spouse Name: N/A  . Number of Children: N/A  . Years of Education: N/A   Occupational History  . Not on file.    Social History Main Topics  . Smoking status: Former Games developer  . Smokeless tobacco: Not on file  . Alcohol Use: Yes     Comment: socially  . Drug Use: No  . Sexual Activity: Not on file   Other Topics Concern  . Not on file   Social History Narrative    Family History  Problem Relation Age of Onset  . Heart disease Mother   . Heart attack Mother   . Hypertension Mother   . Heart disease Father   . Heart attack Father   . Hypertension Father   . Heart disease Brother   . Stroke Brother   . Heart attack Other       Review of systems complete and found to be negative unless listed above      PHYSICAL EXAM  General: Well developed, well nourished, in no acute distress HEENT:  Normocephalic and atramatic Neck:  No JVD.  Lungs: Clear bilaterally to auscultation and percussion. Heart: HRRR . Normal S1 and S2 without gallops or murmurs.  Abdomen: Bowel sounds are positive, abdomen soft and non-tender  Msk:  Back normal, normal gait. Normal strength and tone for age. Extremities: No clubbing, cyanosis or edema.   Neuro: Alert and oriented X 3. Psych:  Good affect, responds appropriately  Labs:   Lab Results  Component Value Date   WBC 6.2 12/21/2015   HGB 15.3 12/21/2015   HCT 45.5 12/21/2015   MCV 82.9 12/21/2015   PLT 203 12/21/2015  Recent Labs Lab 12/21/15 1643  NA 133*  K 3.5  CL 105  CO2 20*  BUN 24*  CREATININE 0.92  CALCIUM 9.3  PROT 7.6  BILITOT 0.8  ALKPHOS 77  ALT 12*  AST 16  GLUCOSE 353*   Lab Results  Component Value Date   TROPONINI <0.03 12/22/2015   No results found for: CHOL No results found for: HDL No results found for: LDLCALC No results found for: TRIG No results found for: CHOLHDL No results found for: LDLDIRECT    Radiology: Ct Head Wo Contrast  12/21/2015  CLINICAL DATA:  Syncope.  History of hypertension and diabetes. EXAM: CT HEAD WITHOUT CONTRAST TECHNIQUE: Contiguous axial images were obtained from the base of  the skull through the vertex without intravenous contrast. COMPARISON:  None. FINDINGS: The ventricles are normal in configuration. 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 Hypoattenuation in the right caudate nucleus head is consistent with an old lacune infarct. There are no parenchymal masses or mass effect. There is no evidence of a recent cortical infarct. Patchy areas of white matter hypoattenuation are noted consistent with mild chronic microvascular ischemic change, more apparent on the left. There are no extra-axial masses or abnormal fluid collections. There is no intracranial hemorrhage. Visualized sinuses and mastoid air cells are clear. IMPRESSION: 1. No acute intracranial abnormalities. 2. 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 Electronically Signed   By: Amie Portlandavid  Ormond M.D.   On: 12/21/2015 17:30   [place] Carotid Bilateral  12/22/2015  CLINICAL DATA:  70 year old female with symptoms of stroke EXAM: BILATERAL CAROTID DUPLEX ULTRASOUND TECHNIQUE: Wallace CullensGray scale imaging, color Doppler and duplex ultrasound were performed of bilateral carotid and vertebral arteries in the neck. COMPARISON:  Head CT 12/21/2015 FINDINGS: Criteria: Quantification of carotid stenosis is based on velocity parameters that correlate the residual internal carotid diameter with NASCET-based stenosis levels, using the diameter of the distal internal carotid lumen as the denominator for stenosis measurement. The following velocity measurements were obtained: RIGHT ICA:  109/18 cm/sec CCA:  91/19 cm/sec SYSTOLIC ICA/CCA RATIO:  1.2 DIASTOLIC ICA/CCA RATIO:  0.9 ECA:  95 cm/sec LEFT ICA:  116/10 cm/sec CCA:  52/5 cm/sec SYSTOLIC ICA/CCA RATIO:  2.2 DIASTOLIC ICA/CCA RATIO:  2.1 ECA:  155 cm/sec RIGHT CAROTID ARTERY: Mild heterogeneous atherosclerotic plaque in the distal common carotid artery extending into the proximal internal carotid artery.  By peak systolic velocity criteria the estimated stenosis remains less than 50%. RIGHT VERTEBRAL ARTERY:  Patent with normal antegrade flow. LEFT CAROTID ARTERY: Mild smooth heterogeneous plaque in the distal common carotid artery extending into the proximal internal carotid artery. By peak systolic velocity criteria the estimated stenosis remains less than 50%. LEFT VERTEBRAL ARTERY:  Patent with normal antegrade flow. IMPRESSION: 1. Mild (1-49%) stenosis proximal right internal carotid artery secondary to heterogenous atherosclerotic plaque. 2. Mild (1-49%) stenosis proximal left internal carotid artery secondary to heterogenous atherosclerotic plaque. 3. Vertebral arteries are patent with normal antegrade flow. Signed, Sterling BigHeath K. McCullough, MD [facility] Electronically Signed   By: Malachy MoanHeath  McCullough M.D.   On: 12/22/2015 09:38   Dg Chest Portable 1 View  12/21/2015  CLINICAL DATA:  Unresponsive EXAM: PORTABLE CHEST 1 VIEW COMPARISON:  None. FINDINGS: Cardiac shadow is within normal limits. The lungs are well aerated bilaterally. No acute bony abnormality is seen. IMPRESSION: No active disease. Electronically Signed   By: Loraine LericheMark  Lukens M.D.   On: 12/21/2015 17:38    EKG: Normal sinus rhythm  ASSESSMENT AND PLAN:   1. Syncope, atypical, associated with altered mental status and confusion, with normal sinus rhythm on telemetry.  Recommendations  1. Agree with overall current therapy 2. Continue to monitor on telemetry 3. Review 2-D echocardiogram 4. Await neurology consult   Signed: Marcina Millard MD,PhD, [facility] 12/22/2015, 12:32 PM

## 2015-12-22 NOTE — Progress Notes (Addendum)
Inpatient Diabetes Program Recommendations  AACE/ADA: New Consensus Statement on Inpatient Glycemic Control (2015)  Target Ranges:  Prepandial:   less than 140 mg/dL      Peak postprandial:   less than 180 mg/dL (1-2 hours)      Critically ill patients:  140 - 180 mg/dL   Review of Glycemic Control  Results for Emily [name], Emily [name] (MRN [id number]) as of 12/22/2015 08:11  Ref. Range 12/21/2015 16:43 12/21/2015 22:26 12/22/2015 07:29  Glucose-Capillary Latest Ref Range: 65-99 mg/dL 469326 ([name]) 629347 ([name]) 528248 ([name])   Diabetes history: Type 2 Outpatient Diabetes medications: Glucophage 500-1000mg  bid Current orders for Inpatient glycemic control: Glucophage 500mg  qam, Glucophage 1000mg  qpm, Novolog 0-9 units tid, Novolog 0-5 units qhs  Inpatient Diabetes Program Recommendations: Please consider starting Lantus 10 units qday, starting now.  D/C Metformin while the patient is in hospital. Please check an A1C and consider increasing Novolog correction to moderate (given her weight) tid.  Please change diet to heart healthy/carb modified.    Susette RacerJulie Carrianne Hyun, RN, BA, MHA, CDE Diabetes Coordinator Inpatient Diabetes Program  [phone] (Team Pager) [phone] [facility] Office) 12/22/2015 8:19 AM

## 2015-12-22 NOTE — Progress Notes (Signed)
[facility] [place] [facility]   PATIENT NAME: Emily [name]    MR#:  [id number]  DATE OF BIRTH:  [birth year]  SUBJECTIVE:  CHIEF COMPLAINT:   Chief Complaint  Patient presents with  . Altered Mental Status   No complaint. REVIEW OF SYSTEMS:  CONSTITUTIONAL: No fever, fatigue or weakness.  EYES: No blurred or double vision.  EARS, NOSE, AND THROAT: No tinnitus or ear pain.  RESPIRATORY: No cough, shortness of breath, wheezing or hemoptysis.  CARDIOVASCULAR: No chest pain, orthopnea, edema.  GASTROINTESTINAL: No nausea, vomiting, diarrhea or abdominal pain.  GENITOURINARY: No dysuria, hematuria.  ENDOCRINE: No polyuria, nocturia,  HEMATOLOGY: No anemia, easy bruising or bleeding SKIN: No rash or lesion. MUSCULOSKELETAL: No joint pain or arthritis.   NEUROLOGIC: No tingling, numbness, weakness.  PSYCHIATRY: No anxiety or depression.   DRUG ALLERGIES:   Allergies  Allergen Reactions  . Enalapril     VITALS:  Blood pressure 142/88, pulse 112, temperature 97.7 F (36.5 C), temperature source Oral, resp. rate 20, height 5' 2.01" (1.575 m), weight 103.42 kg (228 lb), SpO2 99 %.  PHYSICAL EXAMINATION:  GENERAL:  70 y.o.-year-old patient lying in the bed with no acute distress.  EYES: Pupils equal, round, reactive to light and accommodation. No scleral icterus. Extraocular muscles intact.  HEENT: Head atraumatic, normocephalic. Oropharynx and nasopharynx clear. Moist oral mucosa. NECK:  Supple, no jugular venous distention. No thyroid enlargement, no tenderness.  LUNGS: Normal breath sounds bilaterally, no wheezing, rales,rhonchi or crepitation. No use of accessory muscles of respiration.  CARDIOVASCULAR: S1, S2 normal. No murmurs, rubs, or gallops.  ABDOMEN: Soft, nontender, nondistended. Bowel sounds present. No organomegaly or mass.  EXTREMITIES: No pedal edema, cyanosis, or clubbing.  NEUROLOGIC: Cranial nerves II through XII are intact.  Muscle strength 5/5 in all extremities. Sensation intact. Gait not checked.  PSYCHIATRIC: The patient is alert and oriented x 3.  SKIN: No obvious rash, lesion, or ulcer.    LABORATORY PANEL:   CBC  Recent Labs Lab 12/21/15 1643  WBC 6.2  HGB 15.3  HCT 45.5  PLT 203   ------------------------------------------------------------------------------------------------------------------  Chemistries   Recent Labs Lab 12/21/15 1643  NA 133*  K 3.5  CL 105  CO2 20*  GLUCOSE 353*  BUN 24*  CREATININE 0.92  CALCIUM 9.3  AST 16  ALT 12*  ALKPHOS 77  BILITOT 0.8   ------------------------------------------------------------------------------------------------------------------  Cardiac Enzymes  Recent Labs Lab 12/22/15 0758  TROPONINI <0.03   ------------------------------------------------------------------------------------------------------------------  RADIOLOGY:  Ct Head Wo Contrast  12/21/2015  CLINICAL DATA:  Syncope.  History of hypertension and diabetes. EXAM: CT HEAD WITHOUT CONTRAST TECHNIQUE: Contiguous axial images were obtained from the base of the skull through the vertex without intravenous contrast. COMPARISON:  None. FINDINGS: The ventricles are normal in configuration. 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 Hypoattenuation in the right caudate nucleus head is consistent with an old lacune infarct. There are no parenchymal masses or mass effect. There is no evidence of a recent cortical infarct. Patchy areas of white matter hypoattenuation are noted consistent with mild chronic microvascular ischemic change, more apparent on the left. There are no extra-axial masses or abnormal fluid collections. There is no intracranial hemorrhage. Visualized sinuses and mastoid air cells are clear. IMPRESSION: 1. No acute intracranial abnormalities. 2. Old infarcts and mild chronic microvascular  ischemic change. Electronically Signed   By: Renard Hamperavid  Ormond M.D.  On: 12/21/2015 17:30   Mr Brain Wo Contrast  12/22/2015  CLINICAL DATA:  Speech disturbance. Syncope and altered mental status. EXAM: MRI HEAD WITHOUT CONTRAST TECHNIQUE: Multiplanar, multiecho pulse sequences of the brain and surrounding structures were obtained without intravenous contrast. COMPARISON:  Head CT 12/21/2015 FINDINGS: There are small foci of restricted diffusion in both MCA territories consistent with acute infarcts involving the right caudate head, right caudate body/corona radiata, right frontal operculum, left insula and subinsular white matter, and posterior left temporal lobe. The largest infarcts measure approximately 1.5 cm each in the right caudate and left temporal lobe. No hemorrhage is identified associated with these infarcts. There is a chronic hemorrhagic infarct in the left basal ganglia with ex vacuo enlargement of the left frontal horn. There is slight leftward midline shift at this level due to volume loss. The ventricles are otherwise normal in size for age. Patchy T2 hyperintensities in the periventricular greater than subcortical cerebral white matter are compatible with moderate chronic small vessel ischemic disease. Orbits are unremarkable. There is minimal right maxillary sinus mucosal thickening, and there is a trace left mastoid effusion. The intracranial left ICA flow void is mildly abnormal in appearance with predominantly peripherally increased T2/ FLAIR signal, most notable in the petrous and cavernous portions. There is also hyperintense FLAIR signal involving the MCAs which may reflect decreased flow. IMPRESSION: 1. Multiple small acute infarcts involving both MCA territories as above suggestive of emboli. 2. Chronic left basal ganglia hemorrhagic infarct. 3. Moderate chronic small vessel ischemic disease. 4. Abnormal left ICA flow void which may reflect underlying atherosclerotic stenosis though  dissection is not excluded. Consider further evaluation with head and neck CTA. Electronically Signed   By: Sebastian Ache M.D.   On: 12/22/2015 14:58   US Carotid Bilateral  12/22/2015  CLINICAL DATA:  70 year old female with symptoms of stroke EXAM: BILATERAL CAROTID DUPLEX ULTRASOUND TECHNIQUE: Wallace Cullens scale imaging, color Doppler and duplex ultrasound were performed of bilateral carotid and vertebral arteries in the neck. COMPARISON:  Head CT 12/21/2015 FINDINGS: Criteria: Quantification of carotid stenosis is based on velocity parameters that correlate the residual internal carotid diameter with NASCET-based stenosis levels, using the diameter of the distal internal carotid lumen as the denominator for stenosis measurement. The following velocity measurements were obtained: RIGHT ICA:  109/18 cm/sec CCA:  91/19 cm/sec SYSTOLIC ICA/CCA RATIO:  1.2 DIASTOLIC ICA/CCA RATIO:  0.9 ECA:  95 cm/sec LEFT ICA:  116/10 cm/sec CCA:  52/5 cm/sec SYSTOLIC ICA/CCA RATIO:  2.2 DIASTOLIC ICA/CCA RATIO:  2.1 ECA:  155 cm/sec RIGHT CAROTID ARTERY: Mild heterogeneous atherosclerotic plaque in the distal common carotid artery extending into the proximal internal carotid artery. By peak systolic velocity criteria the estimated stenosis remains less than 50%. RIGHT VERTEBRAL ARTERY:  Patent with normal antegrade flow. LEFT CAROTID ARTERY: Mild smooth heterogeneous plaque in the distal common carotid artery extending into the proximal internal carotid artery. By peak systolic velocity criteria the estimated stenosis remains less than 50%. LEFT VERTEBRAL ARTERY:  Patent with normal antegrade flow. IMPRESSION: 1. Mild (1-49%) stenosis proximal right internal carotid artery secondary to heterogenous atherosclerotic plaque. 2. Mild (1-49%) stenosis proximal left internal carotid artery secondary to heterogenous atherosclerotic plaque. 3. Vertebral arteries are patent with normal antegrade flow. Signed, Sterling Big, MD [facility] Electronically Signed   By: Malachy Moan M.D.   On: 12/22/2015 09:38   Dg Chest Portable 1 View  12/21/2015  CLINICAL DATA:  Unresponsive EXAM:  PORTABLE CHEST 1 VIEW COMPARISON:  None. FINDINGS: Cardiac shadow is within normal limits. The lungs are well aerated bilaterally. No acute bony abnormality is seen. IMPRESSION: No active disease. Electronically Signed   By: Alcide Clever M.D.   On: 12/21/2015 17:38    EKG:   Orders placed or performed during the hospital encounter of 12/21/15  . ED EKG  . ED EKG  . EKG 12-Lead  . EKG 12-Lead    ASSESSMENT AND PLAN:   Acute CVA, multiple small acute infarct per MRI. ASA 325 mg po daily, change to Lipitor, get CT of head and neck with contrast. Follow-up echocardiogram. Neuro check, lipid panel, PT evaluation. Follow-up neurologist. Hold metformin and start IV normal saline.  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 Continue aspirin and Lipitor.  Syncope. Possible due to acute CVA/dehydration. Fall precaution and follow-up echocardiogram.  Accelerated hypertension, continue Cardizem with hydralazine IV when necessary.  Dehydration and hyponatremia. Start normal saline IV and follow-up BMP.  Diabetes. Hold metformin, start sliding scale and Lantus 10 units at bedtime.   I discussed with the neurologist, Dr. Thad Ranger. All the records are reviewed and case discussed with Care Management/Social Workerr. Management plans discussed with the patient, family and they are in agreement.  CODE STATUS: full code.  TOTAL TIME TAKING CARE OF THIS PATIENT: 39 minutes.  Greater than 50% time was spent on coordination of care and face-to-face counseling.  POSSIBLE D/C IN 2 DAYS, DEPENDING ON CLINICAL CONDITION.   Shaune Pollack M.D on 12/22/2015 at 3:35 PM  Between 7am to 6pm - Pager - [phone]  After 6pm go to [email or web address] - password [email or web address] [facility]  [facility]  Office   [phone]  CC: Primary care physician; Wonda Cheng, MD

## 2015-12-22 NOTE — Progress Notes (Signed)
*  PRELIMINARY RESULTS* Echocardiogram 2D Echocardiogram has been performed.  Emily [name] 12/22/2015, 9:38 AM

## 2015-12-22 NOTE — Progress Notes (Signed)
Mri pos for  Infarct. Ct this pm after mri. Neuro q 4 hrs.  See  flowsheet

## 2015-12-22 NOTE — Care Management Obs Status (Signed)
MEDICARE OBSERVATION STATUS NOTIFICATION   Patient Details  Name: Emily [name] MRN: [id number] Date of Birth: 03-08-1946   Medicare Observation Status Notification Given:  Yes    Gwenette GreetBrenda S Laval Cafaro, RN 12/22/2015, 11:10 AM

## 2015-12-22 NOTE — Consult Note (Signed)
Reason for Consult:Syncope Referring Physician: Imogene Burn  CC: Syncope  HPI: Emily [name] is an 70 y.o. female who reports that she was at church serving some food when she had a syncopal episode. Patient does not remember her syncopal episode and denies any prodromal symptoms like chest pain, palpitations, nausea, vomiting or any other associated symptoms. She does say about a year ago she had a similar episode. When she presented to the emergency room she was somewhat confused but while in the ED her mental status slowly improved and she returned to baseline.   Past Medical History  Diagnosis Date  . Essential hypertension, benign   . Type II or unspecified type diabetes mellitus without mention of complication, uncontrolled   . Gout, unspecified   . Mixed hyperlipidemia   . Syncope and collapse     Past Surgical History  Procedure Laterality Date  . Abdominal hysterectomy    . Tonsillectomy and adenoidectomy      Family History  Problem Relation Age of Onset  . Heart disease Mother   . Heart attack Mother   . Hypertension Mother   . Heart disease Father   . Heart attack Father   . Hypertension Father   . Heart disease Brother   . Stroke Brother   . Heart attack Other     Social History:  reports that she has quit smoking. She does not have any smokeless tobacco history on file. She reports that she drinks alcohol. She reports that she does not use illicit drugs.  Allergies  Allergen Reactions  . Enalapril     Medications:  I have reviewed the patient's current medications. Prior to Admission:  Prescriptions prior to admission  Medication Sig Dispense Refill Last Dose  . diltiazem (CARDIZEM SR) 90 MG 12 hr capsule Take 90 mg by mouth 2 (two) times daily.   unknown at unknown  . metFORMIN (GLUCOPHAGE) 500 MG tablet Take 500-1,000 mg by mouth 2 (two) times daily with a meal. Take 500mg  in morning and 1000mg  every evening   unknown at unknown  . pravastatin  (PRAVACHOL) 40 MG tablet Take 40 mg by mouth daily.   unknown at unknown   Scheduled: . aspirin  81 mg Oral Daily  . diltiazem  90 mg Oral BID  . enoxaparin (LOVENOX) injection  40 mg Subcutaneous BID  . insulin aspart  0-5 Units Subcutaneous QHS  . insulin aspart  0-9 Units Subcutaneous TID WC  . metFORMIN  500 mg Oral Q breakfast   And  . metFORMIN  1,000 mg Oral Q supper  . pravastatin  40 mg Oral Daily  . sodium chloride flush  3 mL Intravenous Q12H    ROS: History obtained from the patient  General ROS: negative for - chills, fatigue, fever, night sweats, weight gain or weight loss Psychological ROS: negative for - behavioral disorder, hallucinations, memory difficulties, mood swings or suicidal ideation Ophthalmic ROS: negative for - blurry vision, double vision, eye pain or loss of vision ENT ROS: negative for - epistaxis, nasal discharge, oral lesions, sore throat, tinnitus or vertigo Allergy and Immunology ROS: negative for - hives or itchy/watery eyes Hematological and Lymphatic ROS: negative for - bleeding problems, bruising or swollen lymph nodes Endocrine ROS: negative for - galactorrhea, hair pattern changes, polydipsia/polyuria or temperature intolerance Respiratory ROS: negative for - cough, hemoptysis, shortness of breath or wheezing Cardiovascular ROS: negative for - chest pain, dyspnea on exertion, edema or irregular heartbeat Gastrointestinal ROS: negative for - abdominal  pain, diarrhea, hematemesis, nausea/vomiting or stool incontinence Genito-Urinary ROS: negative for - dysuria, hematuria, incontinence or urinary frequency/urgency Musculoskeletal ROS: negative for - joint swelling or muscular weakness Neurological ROS: as noted in HPI Dermatological ROS: negative for rash and skin lesion changes  Physical Examination: Blood pressure 158/76, pulse 84, temperature 98.3 F (36.8 C), temperature source Oral, resp. rate 20, height 5' 2.01" (1.575 m), weight 103.42  kg (228 lb), SpO2 97 %.  HEENT-  Normocephalic, no lesions, without obvious abnormality.  Normal external eye and conjunctiva.  Normal TM's bilaterally.  Normal auditory canals and external ears. Normal external nose, mucus membranes and septum.  Normal pharynx. Cardiovascular- S1, S2 normal, pulses palpable throughout   Lungs- chest clear, no wheezing, rales, normal symmetric air entry Abdomen- soft, non-tender; bowel sounds normal; no masses,  no organomegaly Extremities- mild edema Lymph-no adenopathy palpable Musculoskeletal-no joint tenderness, deformity or swelling Skin-warm and dry, no hyperpigmentation, vitiligo, or suspicious lesions  Neurological Examination Mental Status: Alert, oriented, thought content appropriate.  Speech with significant word finding issues and paraphasic errors.  Able to follow 3 step commands without difficulty. Cranial Nerves: II: Discs flat bilaterally; Visual fields grossly normal, pupils equal, round, reactive to light and accommodation III,IV, VI: ptosis not present, extra-ocular motions intact bilaterally V,VII: smile symmetric, facial light touch sensation normal bilaterally VIII: hearing normal bilaterally IX,X: gag reflex present XI: bilateral shoulder shrug XII: midline tongue extension Motor: Right : Upper extremity   5/5    Left:     Upper extremity   5/5  Lower extremity   5/5     Lower extremity   5/5 Tone and bulk:normal tone throughout; no atrophy noted Sensory: Pinprick and light touch intact throughout, bilaterally Deep Tendon Reflexes: 2+ with absent AJ's bilaterally Plantars: Right: downgoing   Left: downgoing Cerebellar: Normal finger-to-nose and normal heel-to-shin testing bilaterally Gait: not tested due to safety concerns   Laboratory Studies:   Basic Metabolic Panel:  Recent Labs Lab 12/21/15 1643  NA 133*  K 3.5  CL 105  CO2 20*  GLUCOSE 353*  BUN 24*  CREATININE 0.92  CALCIUM 9.3    Liver Function  Tests:  Recent Labs Lab 12/21/15 1643  AST 16  ALT 12*  ALKPHOS 77  BILITOT 0.8  PROT 7.6  ALBUMIN 3.7   No results for input(s): LIPASE, AMYLASE in the last 168 hours. No results for input(s): AMMONIA in the last 168 hours.  CBC:  Recent Labs Lab 12/21/15 1643  WBC 6.2  NEUTROABS 3.5  HGB 15.3  HCT 45.5  MCV 82.9  PLT 203    Cardiac Enzymes:  Recent Labs Lab 12/21/15 1643 12/21/15 1838 12/21/15 2356 12/22/15 0404 12/22/15 0758  TROPONINI 0.04* 0.10* <0.03 <0.03 <0.03    BNP: Invalid input(s): POCBNP  CBG:  Recent Labs Lab 12/21/15 1643 12/21/15 2226 12/22/15 0729  GLUCAP 326* 347* 248*    Microbiology: No results found for this or any previous visit.  Coagulation Studies: No results for input(s): LABPROT, INR in the last 72 hours.  Urinalysis: No results for input(s): COLORURINE, LABSPEC, PHURINE, GLUCOSEU, HGBUR, BILIRUBINUR, KETONESUR, PROTEINUR, UROBILINOGEN, NITRITE, LEUKOCYTESUR in the last 168 hours.  Invalid input(s): APPERANCEUR  Lipid Panel:  No results found for: CHOL, TRIG, HDL, CHOLHDL, VLDL, LDLCALC  HgbA1C: No results found for: HGBA1C  Urine Drug Screen:  No results found for: LABOPIA, COCAINSCRNUR, LABBENZ, AMPHETMU, THCU, LABBARB  Alcohol Level: No results for input(s): ETH in the last 168 hours.  Other results:  EKG: sinus rhythm at 71 bpm, LVH.  Imaging: Ct Head Wo Contrast  12/21/2015  CLINICAL DATA:  Syncope.  History of hypertension and diabetes. EXAM: CT HEAD WITHOUT CONTRAST TECHNIQUE: Contiguous axial images were obtained from the base of the skull through the vertex without intravenous contrast. COMPARISON:  None. FINDINGS: The ventricles are normal in configuration. 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 Hypoattenuation in the right caudate nucleus head is consistent with an old lacune infarct. There are no parenchymal masses or mass  effect. There is no evidence of a recent cortical infarct. Patchy areas of white matter hypoattenuation are noted consistent with mild chronic microvascular ischemic change, more apparent on the left. There are no extra-axial masses or abnormal fluid collections. There is no intracranial hemorrhage. Visualized sinuses and mastoid air cells are clear. IMPRESSION: 1. No acute intracranial abnormalities. 2. 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 Electronically Signed   By: Amie Portland M.D.   On: 12/21/2015 17:30   US Carotid Bilateral  12/22/2015  CLINICAL DATA:  70 year old female with symptoms of stroke EXAM: BILATERAL CAROTID DUPLEX ULTRASOUND TECHNIQUE: Wallace Cullens scale imaging, color Doppler and duplex ultrasound were performed of bilateral carotid and vertebral arteries in the neck. COMPARISON:  Head CT 12/21/2015 FINDINGS: Criteria: Quantification of carotid stenosis is based on velocity parameters that correlate the residual internal carotid diameter with NASCET-based stenosis levels, using the diameter of the distal internal carotid lumen as the denominator for stenosis measurement. The following velocity measurements were obtained: RIGHT ICA:  109/18 cm/sec CCA:  91/19 cm/sec SYSTOLIC ICA/CCA RATIO:  1.2 DIASTOLIC ICA/CCA RATIO:  0.9 ECA:  95 cm/sec LEFT ICA:  116/10 cm/sec CCA:  52/5 cm/sec SYSTOLIC ICA/CCA RATIO:  2.2 DIASTOLIC ICA/CCA RATIO:  2.1 ECA:  155 cm/sec RIGHT CAROTID ARTERY: Mild heterogeneous atherosclerotic plaque in the distal common carotid artery extending into the proximal internal carotid artery. By peak systolic velocity criteria the estimated stenosis remains less than 50%. RIGHT VERTEBRAL ARTERY:  Patent with normal antegrade flow. LEFT CAROTID ARTERY: Mild smooth heterogeneous plaque in the distal common carotid artery extending into the proximal internal carotid artery. By peak systolic velocity criteria the estimated stenosis remains less than 50%. LEFT VERTEBRAL  ARTERY:  Patent with normal antegrade flow. IMPRESSION: 1. Mild (1-49%) stenosis proximal right internal carotid artery secondary to heterogenous atherosclerotic plaque. 2. Mild (1-49%) stenosis proximal left internal carotid artery secondary to heterogenous atherosclerotic plaque. 3. Vertebral arteries are patent with normal antegrade flow. Signed, Sterling Big, MD [facility] Radiology Electronically Signed   By: Malachy Moan M.D.   On: 12/22/2015 09:38   Dg Chest Portable 1 View  12/21/2015  CLINICAL DATA:  Unresponsive EXAM: PORTABLE CHEST 1 VIEW COMPARISON:  None. FINDINGS: Cardiac shadow is within normal limits. The lungs are well aerated bilaterally. No acute bony abnormality is seen. IMPRESSION: No active disease. Electronically Signed   By: Alcide Clever M.D.   On: 12/21/2015 17:38     Assessment/Plan: 70 year old female presenting after a syncopal episode.  Patient with a history of another about a year ago.  Has had no syncopal events or prodrome of syncope since that time until the presenting event.  Not compliant with medications.  Blood sugar elevated.  Head CT personally reviewed and shows no acute changes.  Carotid dopplers show no hemodynamically significant stenosis.  Patient on no antiplatelet therapy at home.  Concern is for speech.  Would like to rule out a small ischemic event.    Recommendations: 1.  MRI of the brain without contrast.   2.  Orthostatic vital signs 3.  EEG.  May be performed as an outpatient.   4.  ASA  daily  Thana Farr, MD Neurology [phone] 12/22/2015, 11:04 AM

## 2015-12-23 DIAGNOSIS — I639 Cerebral infarction, unspecified: Secondary | ICD-10-CM

## 2015-12-23 LAB — LIPID PANEL
CHOL/HDL RATIO: 5.1 ratio
CHOLESTEROL: 194 mg/dL (ref 0–200)
HDL: 38 mg/dL — AB (ref >40)
LDL Cholesterol: 128 mg/dL — ABNORMAL HIGH (ref 0–99)
TRIGLYCERIDES: 141 mg/dL (ref <150)
VLDL: 28 mg/dL (ref 0–40)

## 2015-12-23 LAB — GLUCOSE, CAPILLARY
GLUCOSE-CAPILLARY: 232 mg/dL — AB (ref 65–99)
GLUCOSE-CAPILLARY: 229 mg/dL — AB (ref 65–99)
Glucose-Capillary: 236 mg/dL — ABNORMAL HIGH (ref 65–99)
Glucose-Capillary: 275 mg/dL — ABNORMAL HIGH (ref 65–99)

## 2015-12-23 LAB — HEMOGLOBIN A1C: Hgb A1c MFr Bld: 12.7 % — ABNORMAL HIGH (ref 4.0–6.0)

## 2015-12-23 MED ORDER — ASPIRIN EC 81 MG PO TBEC
81.0000 mg | DELAYED_RELEASE_TABLET | Freq: Every day | ORAL | Status: DC
  Administered 2015-12-24 – 2015-12-26 (×3): 81 mg via ORAL
  Filled 2015-12-23 (×3): qty 1

## 2015-12-23 MED ORDER — ATORVASTATIN CALCIUM 40 MG PO TABS: 40.0000 mg | ORAL_TABLET | Freq: Every day | ORAL | Status: AC

## 2015-12-23 MED ORDER — CLOPIDOGREL BISULFATE 75 MG PO TABS: 75.0000 mg | ORAL_TABLET | Freq: Every day | ORAL | Status: DC

## 2015-12-23 MED ORDER — CLOPIDOGREL BISULFATE 75 MG PO TABS
75.0000 mg | ORAL_TABLET | Freq: Every day | ORAL | Status: DC
  Administered 2015-12-23 – 2015-12-26 (×4): 75 mg via ORAL
  Filled 2015-12-23 (×4): qty 1

## 2015-12-23 NOTE — Progress Notes (Signed)
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 Patient iv fluids were stopped, she does have good appetite, MD had put in discharge orders, but her sister and other family members want her to have around the clock care for the next few weeks, case worker working on home health and family is going to get a private agency also. Patient is pleasant, no s/s of distress.

## 2015-12-23 NOTE — Progress Notes (Signed)
Physical Therapy Evaluation Patient Details Name: Emily [name] MRN: [id number] DOB: October 07, 1945 Today's Date: 12/23/2015   History of Present Illness  Chanell Nadeau is a 70 y.o. female with a known history of essential hypertension, gout, hyperlipidemia previous history of syncope who presents to the hospital due to syncope and altered mental status. 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  Clinical Impression  Pt presents with modified independence with all mobility, transfers, and gait.  Pt demonstrated expressive aphasia during session with difficulty answering PT's questions and talking on the phone.  Pt able to follow all commands and demonstrated good safety awareness with mobility.  No further PT indicated at this time and no f/u PT needed after discharge.    Follow Up Recommendations No PT follow up    Equipment Recommendations  None recommended by PT    Recommendations for Other Services Speech consult     Precautions / Restrictions Precautions Precautions: Fall Restrictions Weight Bearing Restrictions: No      Mobility  Bed Mobility Overal bed mobility: Modified Independent                Transfers Overall transfer level: Modified independent Equipment used: None             General transfer comment: Good safety awareness and body mechanics with sit<>stand transfer; able to stand on first attempt.  Ambulation/Gait Ambulation/Gait assistance: Modified independent (Device/Increase time) Ambulation Distance (Feet): 325 Feet Assistive device: None Gait Pattern/deviations: Step-through pattern     General Gait Details: Steady gait with step through gait pattern and steady to quick cadence.  No balance or gait deviations noted.  Stairs            Wheelchair Mobility    Modified Rankin (Stroke Patients Only)       Balance Overall balance assessment:  Modified Independent                                           Pertinent Vitals/Pain Pain Assessment: No/denies pain    Home Living Family/patient expects to be discharged to:: Private residence Living Arrangements: Children;Other relatives Available Help at Discharge: Family Type of Home: House Home Access: Stairs to enter Entrance Stairs-Rails:  (yes) Entrance Stairs-Number of Steps: 3 Home Layout: One level        Prior Function Level of Independence: Independent         Comments: Retired, plays with grandchildren in free time     Hand Dominance   Dominant Hand: Right    Extremity/Trunk Assessment   Upper Extremity Assessment: Overall WFL for tasks assessed           Lower Extremity Assessment: Overall WFL for tasks assessed         Communication   Communication: Expressive difficulties  Cognition Arousal/Alertness: Awake/alert Behavior During Therapy: WFL for tasks assessed/performed Overall Cognitive Status: Difficult to assess                      General Comments General comments (skin integrity, edema, etc.): visible areas c/d/i    Exercises        Assessment/Plan    PT Assessment Patent does not need any further PT services  PT Diagnosis     PT Problem List    PT Treatment Interventions  PT Goals (Current goals can be found in the Care Plan section) Acute Rehab PT Goals Patient Stated Goal: None stated. PT Goal Formulation: With patient Time For Goal Achievement: 01/06/16 Potential to Achieve Goals: Good    Frequency     Barriers to discharge        Co-evaluation               End of Session   Activity Tolerance: Patient tolerated treatment well Patient left: in bed;with call bell/phone within reach;with bed alarm set Nurse Communication: Mobility status         Time: [id number] PT Time Calculation (min) (ACUTE ONLY): 30 min   Charges:   PT Evaluation $PT Eval Low Complexity: 1  Procedure     PT G Codes:        Tommy Minichiello A Donelle Baba 12/23/2015, 10:53 AM

## 2015-12-23 NOTE — Progress Notes (Signed)
[facility] - [place] [facility]   PATIENT NAME: Emily [name]    MR#:  [id number]  DATE OF BIRTH:  1946-08-08  SUBJECTIVE:  CHIEF COMPLAINT:   Chief Complaint  Patient presents with  . Altered Mental Status   No complaint. But the patient has difficulty finding words sometimes. REVIEW OF SYSTEMS:  CONSTITUTIONAL: No fever, fatigue or weakness.  EYES: No blurred or double vision.  EARS, NOSE, AND THROAT: No tinnitus or ear pain.  RESPIRATORY: No cough, shortness of breath, wheezing or hemoptysis.  CARDIOVASCULAR: No chest pain, orthopnea, edema.  GASTROINTESTINAL: No nausea, vomiting, diarrhea or abdominal pain.  GENITOURINARY: No dysuria, hematuria.  ENDOCRINE: No polyuria, nocturia,  HEMATOLOGY: No anemia, easy bruising or bleeding SKIN: No rash or lesion. MUSCULOSKELETAL: No joint pain or arthritis.   NEUROLOGIC: No tingling, numbness, weakness.  PSYCHIATRY: No anxiety or depression.   DRUG ALLERGIES:   Allergies  Allergen Reactions  . Enalapril     VITALS:  Blood pressure 161/81, pulse 79, temperature 98.1 F (36.7 C), temperature source Oral, resp. rate 16, height 5' 2.01" (1.575 m), weight 103.42 kg (228 lb), SpO2 98 %.  PHYSICAL EXAMINATION:  GENERAL:  70 y.o.-year-old patient lying in the bed with no acute distress. Obese. EYES: Pupils equal, round, reactive to light and accommodation. No scleral icterus. Extraocular muscles intact.  HEENT: Head atraumatic, normocephalic. Oropharynx and nasopharynx clear. Moist oral mucosa. NECK:  Supple, no jugular venous distention. No thyroid enlargement, no tenderness.  LUNGS: Normal breath sounds bilaterally, no wheezing, rales,rhonchi or crepitation. No use of accessory muscles of respiration.  CARDIOVASCULAR: S1, S2 normal. No murmurs, rubs, or gallops.  ABDOMEN: Soft, nontender, nondistended. Bowel sounds present. No organomegaly or mass.  EXTREMITIES: No pedal edema, cyanosis, or  clubbing.  NEUROLOGIC: Cranial nerves II through XII are intact. Muscle strength 5/5 in all extremities. Sensation intact. Gait not checked.  PSYCHIATRIC: The patient is alert and oriented x 2- 3.  SKIN: No obvious rash, lesion, or ulcer.    LABORATORY PANEL:   CBC  Recent Labs Lab 12/21/15 1643  WBC 6.2  HGB 15.3  HCT 45.5  PLT 203   ------------------------------------------------------------------------------------------------------------------  Chemistries   Recent Labs Lab 12/21/15 1643  NA 133*  K 3.5  CL 105  CO2 20*  GLUCOSE 353*  BUN 24*  CREATININE 0.92  CALCIUM 9.3  AST 16  ALT 12*  ALKPHOS 77  BILITOT 0.8   ------------------------------------------------------------------------------------------------------------------  Cardiac Enzymes  Recent Labs Lab 12/22/15 0758  TROPONINI <0.03   ------------------------------------------------------------------------------------------------------------------  RADIOLOGY:  Ct Angio Head W/cm &/or Wo Cm  12/22/2015  CLINICAL DATA:  Syncope.  Bilateral MCA infarcts on MRI. EXAM: CT ANGIOGRAPHY HEAD AND NECK TECHNIQUE: Multidetector CT imaging of the head and neck was performed using the standard protocol during bolus administration of intravenous contrast. Multiplanar CT image reconstructions and MIPs were obtained to evaluate the vascular anatomy. Carotid stenosis measurements (when applicable) are obtained utilizing NASCET criteria, using the distal internal carotid diameter as the denominator. CONTRAST:  100 mL Isovue 370 COMPARISON:  Head MRI and carotid Doppler ultrasound earlier today FINDINGS: CT HEAD Brain: Low density is seen in the right caudate, left insula, and posterior left temporal lobe corresponding to the acute infarcts demonstrated on MRI. A chronic left basal ganglia infarct is again seen with ex vacuo dilatation of the left frontal horn. Moderate chronic small vessel ischemic changes are again seen  in the cerebral white matter. No acute intracranial hemorrhage, mass,  or extra-axial fluid collection is seen. Calvarium and skull base: No acute osseous abnormality. Paranasal sinuses: Minimal right maxillary sinus mucosal thickening. Orbits: Unremarkable. CTA NECK Aortic arch: 3 vessel aortic arch. Brachiocephalic and subclavian arteries are widely patent. Right carotid system: Patent with mild atheromatous plaque at the carotid bifurcation. No significant stenosis. Left carotid system: Common carotid artery is patent without stenosis. There is narrowing of the proximal ICA at its origin, however this only measures approximately 30% by NASCET criteria given the small baseline size of the more distal vessel. Vertebral arteries: The vertebral arteries are patent without stenosis. Left is mildly dominant. Skeleton: Mild disc and moderate facet degeneration are present in the cervical spine. There is bilateral facet ankylosis at C7-T1. Other neck: No neck mass. CTA HEAD Anterior circulation: The internal carotid arteries are patent from skullbase to carotid termini with mild atheromatous plaque bilaterally. 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 There is mild left cavernous and mild bilateral supraclinoid ICA narrowing. The right MCA is occluded at its origin. There are small collateral type vessels in this region, and there is reconstitution of the more distal right M1 segment, although the patent right MCA branch vessels overall appear mildly attenuated in caliber diffusely. The left MCA is patent at its origin, however there is progressive proximal MCA narrowing with occlusion at the distal M1 level. There is some collateral supply of M2 and more distal branch vessels, though these overall appear attenuated. The right A1 segment is patent and supplies the left A2. There is mild ACA branch vessel irregularity.  No intracranial aneurysm is identified. Posterior circulation: Intracranial vertebral arteries are widely patent with the left being dominant. Right PICA arises near the dural reflection. AICA origins are patent. Basilar artery is patent without stenosis. Left SCA is patent. Suspected stenosis at the right SCA origin with the vessel poorly opacified distal to this. There are moderate to severe stenoses of the proximal and mid left P2 segment, and there are mild proximal and severe distal P2 stenoses on the right with additional more distal branch vessel irregular narrowing bilaterally. Venous sinuses: Patent. Left transverse and sigmoid sinuses are dominant. Anatomic variants: Severely hypoplastic left A1. Delayed phase: No abnormal enhancement identified. IMPRESSION: 1. Advanced intracranial atherosclerosis. 2. 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 3. Distal left M1 occlusion with some collateral supply of attenuated and irregular more distal left MCA branch vessels. 4. Moderate to severe bilateral P2 PCA stenoses. 5. Mild bilateral intracranial ICA stenosis. 6. 30% left ICA stenosis at its origin. 7. No vertebral or right-sided cervical carotid artery stenosis. Electronically Signed   By: Sebastian Ache M.D.   On: 12/22/2015 18:12   Ct Head Wo Contrast  12/21/2015  CLINICAL DATA:  Syncope.  History of hypertension and diabetes. EXAM: CT HEAD WITHOUT CONTRAST TECHNIQUE: Contiguous axial images were obtained from the base of the skull through the vertex without intravenous contrast. COMPARISON:  None. FINDINGS: The ventricles are normal in configuration. 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 Hypoattenuation in the right caudate nucleus head is consistent with an old lacune infarct. There are no parenchymal masses or mass effect. There is no evidence  of a recent cortical infarct. Patchy areas of white matter hypoattenuation are noted consistent with  mild chronic microvascular ischemic change, more apparent on the left. There are no extra-axial masses or abnormal fluid collections. There is no intracranial hemorrhage. Visualized sinuses and mastoid air cells are clear. IMPRESSION: 1. No acute intracranial abnormalities. 2. 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 Electronically Signed   By: Amie Portlandavid  Ormond M.D.   On: 12/21/2015 17:30   Ct Angio Neck W/cm &/or Wo/cm  12/22/2015  CLINICAL DATA:  Syncope.  Bilateral MCA infarcts on MRI. EXAM: CT ANGIOGRAPHY HEAD AND NECK TECHNIQUE: Multidetector CT imaging of the head and neck was performed using the standard protocol during bolus administration of intravenous contrast. Multiplanar CT image reconstructions and MIPs were obtained to evaluate the vascular anatomy. Carotid stenosis measurements (when applicable) are obtained utilizing NASCET criteria, using the distal internal carotid diameter as the denominator. CONTRAST:  100 mL Isovue 370 COMPARISON:  Head MRI and carotid Doppler ultrasound earlier today FINDINGS: CT HEAD Brain: Low density is seen in the right caudate, left insula, and posterior left temporal lobe corresponding to the acute infarcts demonstrated on MRI. A chronic left basal ganglia infarct is again seen with ex vacuo dilatation of the left frontal horn. Moderate chronic small vessel ischemic changes are again seen in the cerebral white matter. No acute intracranial hemorrhage, mass, or extra-axial fluid collection is seen. Calvarium and skull base: No acute osseous abnormality. Paranasal sinuses: Minimal right maxillary sinus mucosal thickening. Orbits: Unremarkable. CTA NECK Aortic arch: 3 vessel aortic arch. Brachiocephalic and subclavian arteries are widely patent. Right carotid system: Patent with mild atheromatous plaque at the carotid bifurcation. No significant stenosis.  Left carotid system: Common carotid artery is patent without stenosis. There is narrowing of the proximal ICA at its origin, however this only measures approximately 30% by NASCET criteria given the small baseline size of the more distal vessel. Vertebral arteries: The vertebral arteries are patent without stenosis. Left is mildly dominant. Skeleton: Mild disc and moderate facet degeneration are present in the cervical spine. There is bilateral facet ankylosis at C7-T1. Other neck: No neck mass. CTA HEAD Anterior circulation: The internal carotid arteries are patent from skullbase to carotid termini with mild atheromatous plaque bilaterally. 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 There is mild left cavernous and mild bilateral supraclinoid ICA narrowing. The right MCA is occluded at its origin. There are small collateral type vessels in this region, and there is reconstitution of the more distal right M1 segment, although the patent right MCA branch vessels overall appear mildly attenuated in caliber diffusely. The left MCA is patent at its origin, however there is progressive proximal MCA narrowing with occlusion at the distal M1 level. There is some collateral supply of M2 and more distal branch vessels, though these overall appear attenuated. The right A1 segment is patent and supplies the left A2. There is mild ACA branch vessel irregularity. No intracranial aneurysm is identified. Posterior circulation: Intracranial vertebral arteries are widely patent with the left being dominant. Right PICA arises near the dural reflection. AICA origins are patent. Basilar artery is patent without stenosis. Left SCA is patent. Suspected stenosis at the right SCA origin with the vessel poorly opacified distal to this. There are moderate to severe stenoses of the proximal and mid left P2 segment, and there are mild  proximal and severe distal P2 stenoses on the right with additional more distal branch vessel irregular narrowing bilaterally. Venous sinuses:  Patent. Left transverse and sigmoid sinuses are dominant. Anatomic variants: Severely hypoplastic left A1. Delayed phase: No abnormal enhancement identified. IMPRESSION: 1. Advanced intracranial atherosclerosis. 2. 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 3. Distal left M1 occlusion with some collateral supply of attenuated and irregular more distal left MCA branch vessels. 4. Moderate to severe bilateral P2 PCA stenoses. 5. Mild bilateral intracranial ICA stenosis. 6. 30% left ICA stenosis at its origin. 7. No vertebral or right-sided cervical carotid artery stenosis. Electronically Signed   By: Sebastian Ache M.D.   On: 12/22/2015 18:12   Mr Brain Wo Contrast  12/22/2015  CLINICAL DATA:  Speech disturbance. Syncope and altered mental status. EXAM: MRI HEAD WITHOUT CONTRAST TECHNIQUE: Multiplanar, multiecho pulse sequences of the brain and surrounding structures were obtained without intravenous contrast. COMPARISON:  Head CT 12/21/2015 FINDINGS: There are small foci of restricted diffusion in both MCA territories consistent with acute infarcts involving the right caudate head, right caudate body/corona radiata, right frontal operculum, left insula and subinsular white matter, and posterior left temporal lobe. The largest infarcts measure approximately 1.5 cm each in the right caudate and left temporal lobe. No hemorrhage is identified associated with these infarcts. There is a chronic hemorrhagic infarct in the left basal ganglia with ex vacuo enlargement of the left frontal horn. There is slight leftward midline shift at this level due to volume loss. The ventricles are otherwise normal in size for age. Patchy T2 hyperintensities in the periventricular greater than subcortical  cerebral white matter are compatible with moderate chronic small vessel ischemic disease. Orbits are unremarkable. There is minimal right maxillary sinus mucosal thickening, and there is a trace left mastoid effusion. The intracranial left ICA flow void is mildly abnormal in appearance with predominantly peripherally increased T2/ FLAIR signal, most notable in the petrous and cavernous portions. There is also hyperintense FLAIR signal involving the MCAs which may reflect decreased flow. IMPRESSION: 1. Multiple small acute infarcts involving both MCA territories as above suggestive of emboli. 2. Chronic left basal ganglia hemorrhagic infarct. 3. Moderate chronic small vessel ischemic disease. 4. Abnormal left ICA flow void which may reflect underlying atherosclerotic stenosis though dissection is not excluded. Consider further evaluation with head and neck CTA. Electronically Signed   By: Sebastian Ache M.D.   On: 12/22/2015 14:58   US Carotid Bilateral  12/22/2015  CLINICAL DATA:  70 year old female with symptoms of stroke EXAM: BILATERAL CAROTID DUPLEX ULTRASOUND TECHNIQUE: Wallace Cullens scale imaging, color Doppler and duplex ultrasound were performed of bilateral carotid and vertebral arteries in the neck. COMPARISON:  Head CT 12/21/2015 FINDINGS: Criteria: Quantification of carotid stenosis is based on velocity parameters that correlate the residual internal carotid diameter with NASCET-based stenosis levels, using the diameter of the distal internal carotid lumen as the denominator for stenosis measurement. The following velocity measurements were obtained: RIGHT ICA:  109/18 cm/sec CCA:  91/19 cm/sec SYSTOLIC ICA/CCA RATIO:  1.2 DIASTOLIC ICA/CCA RATIO:  0.9 ECA:  95 cm/sec LEFT ICA:  116/10 cm/sec CCA:  52/5 cm/sec SYSTOLIC ICA/CCA RATIO:  2.2 DIASTOLIC ICA/CCA RATIO:  2.1 ECA:  155 cm/sec RIGHT CAROTID ARTERY: Mild heterogeneous atherosclerotic plaque in the distal common carotid artery extending into the proximal  internal carotid artery. By peak systolic velocity criteria the estimated stenosis remains less than 50%. RIGHT VERTEBRAL ARTERY:  Patent with normal antegrade flow. LEFT CAROTID ARTERY: Mild smooth heterogeneous plaque in the distal common carotid artery extending  into the proximal internal carotid artery. By peak systolic velocity criteria the estimated stenosis remains less than 50%. LEFT VERTEBRAL ARTERY:  Patent with normal antegrade flow. IMPRESSION: 1. Mild (1-49%) stenosis proximal right internal carotid artery secondary to heterogenous atherosclerotic plaque. 2. Mild (1-49%) stenosis proximal left internal carotid artery secondary to heterogenous atherosclerotic plaque. 3. Vertebral arteries are patent with normal antegrade flow. Signed, Sterling Big, MD [facility] Electronically Signed   By: Malachy Moan M.D.   On: 12/22/2015 09:38   Dg Chest Portable 1 View  12/21/2015  CLINICAL DATA:  Unresponsive EXAM: PORTABLE CHEST 1 VIEW COMPARISON:  None. FINDINGS: Cardiac shadow is within normal limits. The lungs are well aerated bilaterally. No acute bony abnormality is seen. IMPRESSION: No active disease. Electronically Signed   By: Alcide Clever M.D.   On: 12/21/2015 17:38    EKG:   Orders placed or performed during the hospital encounter of 12/21/15  . ED EKG  . ED EKG  . EKG 12-Lead  . EKG 12-Lead    ASSESSMENT AND PLAN:   Acute CVA, multiple small acute infarct per MRI. CT of head and neck with contrastreveals right MCA occlusion and distal left M1 occlusion. There is moderate to severe bilateral PCA stenosis with normal vertebrals. . Echocardiogram: Normal ejection fraction. Continue ASA 325 mg po daily, Lipitor, add Plavix 75 mg by mouth daily for the next 3 months, Follow-up neurologist as outpatient per Dr. Thad Ranger.  Neurologist is suggested TEE. I discussed with Dr. Darrold Junker, who suggested this cannot be done  until Monday or later,  or can be done as outpatient.   PT evaluation didn't recommend home PT.  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 Continue aspirin and Lipitor.  Hyperlipidemia. LDL 128. Continue Lipitor.  Syncope. Possible due to acute CVA/dehydration. Fall precaution.  Accelerated hypertension, continue Cardizem with hydralazine IV when necessary.  Dehydration and hyponatremia. She is treated with normal saline IV.  Diabetes. Hold metformin, on sliding scale and Lantus 10 units at bedtime. Resume metformin after discharge.  Obesity.  I discussed with the neurologist, Dr. Thad Ranger and Dr. Darrold Junker. All the records are reviewed and case discussed with Care Management/Social Workerr and speech study staff. Management plans discussed with the patient, her sister and sister-in-law long time, all questions were answered. they are not in agreement to be discharged since nobody can take care of the patient at home. I discussed with case manager and the set up home health with RN, nurse aid and the social worker, but Home health cannot be delivered today per case manager.  CODE STATUS: full code.  TOTAL TIME TAKING CARE OF THIS PATIENT:  56 minutes.  Greater than 50% time was spent on coordination of care and face-to-face counseling.  POSSIBLE D/C IN 2 DAYS, DEPENDING ON CLINICAL CONDITION.   Shaune Pollack M.D on 12/23/2015 at 3:21 PM  Between 7am to 6pm - Pager - [phone]  After 6pm go to [email or web address] - password [email or web address] [facility]  [facility]  Office  [phone]  CC: Primary care physician; Wonda Cheng, MD

## 2015-12-23 NOTE — Progress Notes (Signed)
Assigned to pt from 1625-1900. No neuro changes. Denies pain.

## 2015-12-23 NOTE — Discharge Instructions (Signed)
Heart healthy and ADA diet. Activity as tolerated. Fall precaution. Home health. Outpatient speech.therapy. F/u Dr. Darrold Junker for TEE. ASA and plavix for 3 month Ischemic Stroke Treated Without Warfarin An ischemic stroke is the sudden death of brain tissue. Blood carries oxygen to all areas of your body. This type of stroke happens when your blood does not flow to your brain like normal. Your brain cannot get the oxygen it needs. This is an emergency. Symptoms of any stroke usually happen all of the sudden. You may notice them when you wake up. They can include sudden:  Weakness or loss of feeling in your face, arm, or leg, especially on one side of your body.  Confusion.  Trouble talking or understanding.  Trouble seeing.  Trouble walking or moving your arms or legs.  Dizziness.  Loss of balance or coordination.  Severe headache with no known cause. Get help as soon as any of these problems start. This is important so your doctor can treat you right away with:   Medicines that dissolve blood clots.  A device to remove a blood clot. A few hours after the start of your symptoms, your doctor may treat you with:  Oxygen.  Medicines.  A surgery to widen blood vessels. RISK FACTORS  Risk factors are things that make it more likely for you to have a stroke. These include:  High blood pressure.  High cholesterol.  Diabetes.  Heart disease.  Having a buildup of fatty deposits in the blood vessels.  Having an abnormal heart rhythm (atrial fibrillation).  Being very overweight (obese).  Smoking.  Taking birth control pills, especially if you smoke.  Not being active.  Having a diet that is high in fats, salt, and calories.  Drinking too much alcohol.  Using illegal drugs.  Being African American.  Being over the age of 71.  Having a family history of stroke.  Having a history of blood clots, stroke, warning stroke (transient ischemic attack, TIA), or  heart attack.  Sickle cell disease. HOME CARE  Take all medicines exactly as told by your doctor. Understand all your medicine instructions.  If you are able to swallow, eat healthy foods. Eat 5 or more servings of fruits and vegetables each day. Eat soft foods or pureed foods, or eat small bites of food so you do not choke.  Stay active. Try to get at least 30 minutes of activity on most or all days.  Do not smoke.  Limit or stop alcohol use.  Keep your home safe so you do not fall. Try:  Putting grab bars in the bedroom and bathroom.  Raising toilet seats.  Putting a seat in the shower.  Go to physical, occupational, and speech therapy sessions as told by your doctor.  Use a walker or a cane at all times if told to do so.  Keep all doctor visits as told. GET HELP RIGHT AWAY IF:   You have sudden weakness or loss of feeling in your face, arm, or leg, especially on one side of your body.  You are suddenly confused.  You have trouble talking or understanding.  You have sudden trouble seeing.  You have sudden trouble walking or moving your arms or legs.  You have sudden dizziness.  You lose your balance or your movements are not smooth.  You have a sudden, severe headache with no known cause.  You are partly unaware or totally unaware of what is going on around you. Any of these symptoms  may be a sign of an emergency. Do not wait to see if the symptoms go away. Call for help (911 in [place] Do not drive yourself to the hospital.   This information is not intended to replace advice given to you by your health care provider. Make sure you discuss any questions you have with your health care provider.   Document Released: 06/24/2014 Document Reviewed: 06/24/2014 Elsevier Interactive Patient Education [employer]2016 [employer] Inc.

## 2015-12-23 NOTE — Care Management Note (Addendum)
Case Management Note  Patient Details  Name: Theresia LoHelen H Mccrystal MRN: [id number] Date of Birth: 1945-12-22  Subjective/Objective:      Received a call from Dr Imogene Burnhen requesting discharge planning with Mrs Casciano's sister Karlene EinsteinJackie Roberts, cell [phone]. Dr Imogene Burnhen reports that he has spoken to the family. This Clinical research associatewriter explained to Ms Hartsock's sister Karlene EinsteinJackie Roberts that case management will set up home health RN, Aid, Speech, and a social worker, but that these services were only temporary for a few weeks and would not be on a daily basis. This Clinical research associatewriter provided Ms Su HiltRoberts with a list of private pay home health providers such as [facility], [facility], etc.after Ms Su HiltRoberts stated that the family would like to have assistance for [name] 5 days per week. This Clinical research associatewriter updated Mrs Hodgkin's nurse Toniann FailWendy. This Clinical research associatewriter is requesting that a nurse from [facility] please check in with [name] at home tomorrow if she is discharged today.  Ms Su HiltRoberts requested assistance with applying for Medicaid for [name]. This Clinical research associatewriter explained that there is no one at [facility] to do this on the weekends but that the family could take [name] to DSS to [facility] Friday. [name] may need additional services or an increased level of care which is why Dr Imogene Burnhen ordered a home health social worker to assess the home situation. [name] currently resides with her son who works 7am to 3:30pm. [facility] did not recommend home health PT because [name] walked over 300 feet.      Action/Plan:   Expected Discharge Date:                  Expected Discharge Plan:     In-House Referral:     Discharge planning Services     Post Acute Care Choice:    Choice offered to:     DME Arranged:    DME Agency:     HH Arranged:    HH Agency:     Status of Service:     Medicare Important Message Given:    Date Medicare IM Given:     Medicare IM give by:    Date Additional Medicare IM Given:    Additional Medicare Important Message give by:     If discussed at Long Length of Stay Meetings, dates discussed:    Additional Comments:  Darnette Lampron A, RN 12/23/2015, 2:21 PM

## 2015-12-23 NOTE — Evaluation (Addendum)
Speech Language Pathology Evaluation Patient Details Name: Theresia LoHelen H Bertling MRN: [id number] DOB: 06/13/1946 Today's Date: 12/23/2015 Time: [id number] SLP Time Calculation (min) (ACUTE ONLY): 45 min  Problem List:  Patient Active Problem List   Diagnosis Date Noted  . Acute CVA (cerebrovascular accident) (HCC) 12/22/2015  . Syncope 12/21/2015  . Hyperlipidemia 06/23/2013  . DM (diabetes mellitus) (HCC) 06/23/2013  . HTN (hypertension) 06/23/2013  . Morbid obesity (HCC) 06/23/2013  . Abnormal ECG 06/23/2013   Past Medical History:  Past Medical History  Diagnosis Date  . Essential hypertension, benign   . Type II or unspecified type diabetes mellitus without mention of complication, uncontrolled   . Gout, unspecified   . Mixed hyperlipidemia   . Syncope and collapse    Past Surgical History:  Past Surgical History  Procedure Laterality Date  . Abdominal hysterectomy    . Tonsillectomy and adenoidectomy     HPI:  Theresia LoHelen H Wildermuth is an 70 y.o. female who reports that she was at church serving some food when she had a syncopal episode. Patient does not remember her syncopal episode and denies any prodromal symptoms like chest pain, palpitations, nausea, vomiting or any other associated symptoms. She does say about a year ago she had a similar episode. When she presented to the emergency room she was somewhat confused but while in the ED her mental status slowly improved and she returned to baseline. 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   Assessment / Plan / Recommendation Clinical Impression  Pt demonstrated moderate expressive and receptive aphasia. Expressive deficits c/b numerous perseverations and paraphasic errors at the sentence and conversational levels. Pt was not aware of deficits and did not make attempts to self-correct mistakes. Pt demonstrated good confrontational naming and  only errors at the sentence level for repetition. Receptive deficits c/b difficulty following 2-3 step commands and min errors in responding to complex yes/no questions. In conversation pt improved receptively when information was repeated. No dysarthria or apraxia was noted. Pt would benifit from outpatient/homehealth SLP to continue to address expressive/receptive aphasia. Discussed w/MD and nsg.     SLP Assessment  Patient needs continued Speech Lanaguage Pathology Services    Follow Up Recommendations  Outpatient SLP;Home health SLP    Frequency and Duration min 2x/week  4 weeks      SLP Evaluation Prior Functioning  Cognitive/Linguistic Baseline: Within functional limits Type of Home: House  Lives With: Son Available Help at Discharge: Family Vocation: Retired   IT consultantCognition  Overall Cognitive Status: Difficult to assess Arousal/Alertness: Awake/alert Orientation Level: Oriented to person;Oriented to place;Oriented to situation;Oriented to time Attention: Focused    Comprehension  Auditory Comprehension Overall Auditory Comprehension: Impaired Yes/No Questions: Impaired Basic Biographical Questions: 76-100% accurate Complex Questions: 50-74% accurate Commands: Impaired One Step Basic Commands: 75-100% accurate Two Step Basic Commands: 50-74% accurate Multistep Basic Commands: 0-24% accurate Conversation: Complex Other Conversation Comments: Pt observed to need repetition at times during conversation to respond accurately. EffectiveTechniques: Repetition Visual Recognition/Discrimination Discrimination: Not tested Reading Comprehension Reading Status: Not tested    Expression Expression Primary Mode of Expression: Verbal Verbal Expression Overall Verbal Expression: Impaired Initiation: No impairment Automatic Speech: Name;Social Response Level of Generative/Spontaneous Verbalization: Conversation Repetition: Impaired Level of Impairment: Sentence level Naming: No  impairment Pragmatics: No impairment Other Verbal Expression Comments: Pt demonstrated perseverations and paraphasic errors (phonemic paraphasias) at the conversation level. Pt was not aware of errors. Written Expression Dominant  Hand: Right Written Expression:  (Pt able to write name and address did not assess further. )   Oral / Motor  Oral Motor/Sensory Function Overall Oral Motor/Sensory Function: Within functional limits Motor Speech Overall Motor Speech: Appears within functional limits for tasks assessed Respiration: Within functional limits Phonation: Normal Resonance: Within functional limits Articulation: Within functional limitis Intelligibility: Intelligible Motor Planning: Witnin functional limits   GO                    [place],Maaran 12/23/2015, 1:43 PM  Entered wrong start and stop time. Addend to 11:30-12:15

## 2015-12-23 NOTE — Progress Notes (Addendum)
Subjective: Patient continues to have difficulty expressing herself.    Objective: Current vital signs: BP 148/78 mmHg  Pulse 75  Temp(Src) 98 F (36.7 C) (Oral)  Resp 16  Ht 5' 2.01" (1.575 m)  Wt 103.42 kg (228 lb)  BMI 41.69 kg/m2  SpO2 95%  LMP  (LMP Unknown) Vital signs in last 24 hours: Temp:  [97.7 F (36.5 C)-98.6 F (37 C)] 98 F (36.7 C) (04/01 0935) Pulse Rate:  [72-112] 75 (04/01 0935) Resp:  [16-20] 16 (04/01 0935) BP: (142-172)/(71-94) 148/78 mmHg (04/01 0935) SpO2:  [95 %-99 %] 95 % (04/01 0935)  Intake/Output from previous day: 03/31 0701 - 04/01 0700 In: 720 [P.O.:720] Out: -  Intake/Output this shift: Total I/O In: 240 [P.O.:240] Out: -  Nutritional status: Diet Heart Room service appropriate?: Yes; Fluid consistency:: Thin Diet - low sodium heart healthy Diet - low sodium heart healthy  Neurologic Exam: Mental Status: Alert.  Speech with significant word finding issues and paraphasic errors. Able to follow 3 step commands without difficulty. Cranial Nerves: II: Discs flat bilaterally; Visual fields grossly normal, pupils equal, round, reactive to light and accommodation III,IV, VI: ptosis not present, extra-ocular motions intact bilaterally V,VII: smile symmetric, facial light touch sensation normal bilaterally VIII: hearing normal bilaterally IX,X: gag reflex present XI: bilateral shoulder shrug XII: midline tongue extension Motor: Right :Upper extremity 5/5Left: Upper extremity 5/5 Lower extremity 5/5Lower extremity 5/5 Tone and bulk:normal tone throughout; no atrophy noted Sensory: Pinprick and light touch intact throughout, bilaterally  Lab Results: Basic Metabolic Panel:  Recent Labs Lab 12/21/15 1643  NA 133*  K 3.5  CL 105  CO2 20*  GLUCOSE 353*  BUN 24*  CREATININE 0.92  CALCIUM 9.3    Liver Function  Tests:  Recent Labs Lab 12/21/15 1643  AST 16  ALT 12*  ALKPHOS 77  BILITOT 0.8  PROT 7.6  ALBUMIN 3.7   No results for input(s): LIPASE, AMYLASE in the last 168 hours. No results for input(s): AMMONIA in the last 168 hours.  CBC:  Recent Labs Lab 12/21/15 1643  WBC 6.2  NEUTROABS 3.5  HGB 15.3  HCT 45.5  MCV 82.9  PLT 203    Cardiac Enzymes:  Recent Labs Lab 12/21/15 1643 12/21/15 1838 12/21/15 2356 12/22/15 0404 12/22/15 0758  TROPONINI 0.04* 0.10* <0.03 <0.03 <0.03    Lipid Panel:  Recent Labs Lab 12/23/15 0452  CHOL 194  TRIG 141  HDL 38*  CHOLHDL 5.1  VLDL 28  LDLCALC 409*    CBG:  Recent Labs Lab 12/22/15 1123 12/22/15 1621 12/22/15 2152 12/23/15 0724 12/23/15 1134  GLUCAP 269* 307* 330* 236* 275*    Microbiology: No results found for this or any previous visit.  Coagulation Studies: No results for input(s): LABPROT, INR in the last 72 hours.  Imaging: Ct Angio Head W/cm &/or Wo Cm  12/22/2015  CLINICAL DATA:  Syncope.  Bilateral MCA infarcts on MRI. EXAM: CT ANGIOGRAPHY HEAD AND NECK TECHNIQUE: Multidetector CT imaging of the head and neck was performed using the standard protocol during bolus administration of intravenous contrast. Multiplanar CT image reconstructions and MIPs were obtained to evaluate the vascular anatomy. Carotid stenosis measurements (when applicable) are obtained utilizing NASCET criteria, using the distal internal carotid diameter as the denominator. CONTRAST:  100 mL Isovue 370 COMPARISON:  Head MRI and carotid Doppler ultrasound earlier today FINDINGS: CT HEAD Brain: Low density is seen in the right caudate, left insula, and posterior left temporal lobe corresponding to  the acute infarcts demonstrated on MRI. A chronic left basal ganglia infarct is again seen with ex vacuo dilatation of the left frontal horn. Moderate chronic small vessel ischemic changes are again seen in the cerebral white matter. No acute  intracranial hemorrhage, mass, or extra-axial fluid collection is seen. Calvarium and skull base: No acute osseous abnormality. Paranasal sinuses: Minimal right maxillary sinus mucosal thickening. Orbits: Unremarkable. CTA NECK Aortic arch: 3 vessel aortic arch. Brachiocephalic and subclavian arteries are widely patent. Right carotid system: Patent with mild atheromatous plaque at the carotid bifurcation. No significant stenosis. Left carotid system: Common carotid artery is patent without stenosis. There is narrowing of the proximal ICA at its origin, however this only measures approximately 30% by NASCET criteria given the small baseline size of the more distal vessel. Vertebral arteries: The vertebral arteries are patent without stenosis. Left is mildly dominant. Skeleton: Mild disc and moderate facet degeneration are present in the cervical spine. There is bilateral facet ankylosis at C7-T1. Other neck: No neck mass. CTA HEAD Anterior circulation: The internal carotid arteries are patent from skullbase to carotid termini with mild atheromatous plaque bilaterally. 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 There is mild left cavernous and mild bilateral supraclinoid ICA narrowing. The right MCA is occluded at its origin. There are small collateral type vessels in this region, and there is reconstitution of the more distal right M1 segment, although the patent right MCA branch vessels overall appear mildly attenuated in caliber diffusely. The left MCA is patent at its origin, however there is progressive proximal MCA narrowing with occlusion at the distal M1 level. There is some collateral supply of M2 and more distal branch vessels, though these overall appear attenuated. The right A1 segment is patent and supplies the left A2. There is mild ACA branch vessel irregularity. No intracranial aneurysm is  identified. Posterior circulation: Intracranial vertebral arteries are widely patent with the left being dominant. Right PICA arises near the dural reflection. AICA origins are patent. Basilar artery is patent without stenosis. Left SCA is patent. Suspected stenosis at the right SCA origin with the vessel poorly opacified distal to this. There are moderate to severe stenoses of the proximal and mid left P2 segment, and there are mild proximal and severe distal P2 stenoses on the right with additional more distal branch vessel irregular narrowing bilaterally. Venous sinuses: Patent. Left transverse and sigmoid sinuses are dominant. Anatomic variants: Severely hypoplastic left A1. Delayed phase: No abnormal enhancement identified. IMPRESSION: 1. Advanced intracranial atherosclerosis. 2. 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 3. Distal left M1 occlusion with some collateral supply of attenuated and irregular more distal left MCA branch vessels. 4. Moderate to severe bilateral P2 PCA stenoses. 5. Mild bilateral intracranial ICA stenosis. 6. 30% left ICA stenosis at its origin. 7. No vertebral or right-sided cervical carotid artery stenosis. Electronically Signed   By: Sebastian Ache M.D.   On: 12/22/2015 18:12   Ct Head Wo Contrast  12/21/2015  CLINICAL DATA:  Syncope.  History of hypertension and diabetes. EXAM: CT HEAD WITHOUT CONTRAST TECHNIQUE: Contiguous axial images were obtained from the base of the skull through the vertex without intravenous contrast. COMPARISON:  None. FINDINGS: The ventricles are normal in configuration. There is ex vacuo dilation of the anterior left lateral ventricle from an old left anterior basal  ganglia infarct. Hypoattenuation in the right caudate nucleus head is consistent with an old lacune infarct. There are no parenchymal masses or mass effect. There is no evidence of a recent cortical  infarct. Patchy areas of white matter hypoattenuation are noted consistent with mild chronic microvascular ischemic change, more apparent on the left. There are no extra-axial masses or abnormal fluid collections. There is no intracranial hemorrhage. Visualized sinuses and mastoid air cells are clear. IMPRESSION: 1. No acute intracranial abnormalities. 2. 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 Electronically Signed   By: Amie Portlandavid  Ormond M.D.   On: 12/21/2015 17:30   Ct Angio Neck W/cm &/or Wo/cm  12/22/2015  CLINICAL DATA:  Syncope.  Bilateral MCA infarcts on MRI. EXAM: CT ANGIOGRAPHY HEAD AND NECK TECHNIQUE: Multidetector CT imaging of the head and neck was performed using the standard protocol during bolus administration of intravenous contrast. Multiplanar CT image reconstructions and MIPs were obtained to evaluate the vascular anatomy. Carotid stenosis measurements (when applicable) are obtained utilizing NASCET criteria, using the distal internal carotid diameter as the denominator. CONTRAST:  100 mL Isovue 370 COMPARISON:  Head MRI and carotid Doppler ultrasound earlier today FINDINGS: CT HEAD Brain: Low density is seen in the right caudate, left insula, and posterior left temporal lobe corresponding to the acute infarcts demonstrated on MRI. A chronic left basal ganglia infarct is again seen with ex vacuo dilatation of the left frontal horn. Moderate chronic small vessel ischemic changes are again seen in the cerebral white matter. No acute intracranial hemorrhage, mass, or extra-axial fluid collection is seen. Calvarium and skull base: No acute osseous abnormality. Paranasal sinuses: Minimal right maxillary sinus mucosal thickening. Orbits: Unremarkable. CTA NECK Aortic arch: 3 vessel aortic arch. Brachiocephalic and subclavian arteries are widely patent. Right carotid system: Patent with mild atheromatous plaque at the carotid bifurcation. No significant stenosis. Left carotid system:  Common carotid artery is patent without stenosis. There is narrowing of the proximal ICA at its origin, however this only measures approximately 30% by NASCET criteria given the small baseline size of the more distal vessel. Vertebral arteries: The vertebral arteries are patent without stenosis. Left is mildly dominant. Skeleton: Mild disc and moderate facet degeneration are present in the cervical spine. There is bilateral facet ankylosis at C7-T1. Other neck: No neck mass. CTA HEAD Anterior circulation: The internal carotid arteries are patent from skullbase to carotid termini with mild atheromatous plaque bilaterally. 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 There is mild left cavernous and mild bilateral supraclinoid ICA narrowing. The right MCA is occluded at its origin. There are small collateral type vessels in this region, and there is reconstitution of the more distal right M1 segment, although the patent right MCA branch vessels overall appear mildly attenuated in caliber diffusely. The left MCA is patent at its origin, however there is progressive proximal MCA narrowing with occlusion at the distal M1 level. There is some collateral supply of M2 and more distal branch vessels, though these overall appear attenuated. The right A1 segment is patent and supplies the left A2. There is mild ACA branch vessel irregularity. No intracranial aneurysm is identified. Posterior circulation: Intracranial vertebral arteries are widely patent with the left being dominant. Right PICA arises near the dural reflection. AICA origins are patent. Basilar artery is patent without stenosis. Left SCA is patent. Suspected stenosis at the right SCA origin with the vessel  poorly opacified distal to this. There are moderate to severe stenoses of the proximal and mid left P2 segment, and there are mild proximal and severe distal  P2 stenoses on the right with additional more distal branch vessel irregular narrowing bilaterally. Venous sinuses: Patent. Left transverse and sigmoid sinuses are dominant. Anatomic variants: Severely hypoplastic left A1. Delayed phase: No abnormal enhancement identified. IMPRESSION: 1. Advanced intracranial atherosclerosis. 2. 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 3. Distal left M1 occlusion with some collateral supply of attenuated and irregular more distal left MCA branch vessels. 4. Moderate to severe bilateral P2 PCA stenoses. 5. Mild bilateral intracranial ICA stenosis. 6. 30% left ICA stenosis at its origin. 7. No vertebral or right-sided cervical carotid artery stenosis. Electronically Signed   By: Sebastian Ache M.D.   On: 12/22/2015 18:12   Mr Brain Wo Contrast  12/22/2015  CLINICAL DATA:  Speech disturbance. Syncope and altered mental status. EXAM: MRI HEAD WITHOUT CONTRAST TECHNIQUE: Multiplanar, multiecho pulse sequences of the brain and surrounding structures were obtained without intravenous contrast. COMPARISON:  Head CT 12/21/2015 FINDINGS: There are small foci of restricted diffusion in both MCA territories consistent with acute infarcts involving the right caudate head, right caudate body/corona radiata, right frontal operculum, left insula and subinsular white matter, and posterior left temporal lobe. The largest infarcts measure approximately 1.5 cm each in the right caudate and left temporal lobe. No hemorrhage is identified associated with these infarcts. There is a chronic hemorrhagic infarct in the left basal ganglia with ex vacuo enlargement of the left frontal horn. There is slight leftward midline shift at this level due to volume loss. The ventricles are otherwise normal in size for age. Patchy T2 hyperintensities in the periventricular greater than subcortical cerebral white matter are  compatible with moderate chronic small vessel ischemic disease. Orbits are unremarkable. There is minimal right maxillary sinus mucosal thickening, and there is a trace left mastoid effusion. The intracranial left ICA flow void is mildly abnormal in appearance with predominantly peripherally increased T2/ FLAIR signal, most notable in the petrous and cavernous portions. There is also hyperintense FLAIR signal involving the MCAs which may reflect decreased flow. IMPRESSION: 1. Multiple small acute infarcts involving both MCA territories as above suggestive of emboli. 2. Chronic left basal ganglia hemorrhagic infarct. 3. Moderate chronic small vessel ischemic disease. 4. Abnormal left ICA flow void which may reflect underlying atherosclerotic stenosis though dissection is not excluded. Consider further evaluation with head and neck CTA. Electronically Signed   By: Sebastian Ache M.D.   On: 12/22/2015 14:58   US Carotid Bilateral  12/22/2015  CLINICAL DATA:  70 year old female with symptoms of stroke EXAM: BILATERAL CAROTID DUPLEX ULTRASOUND TECHNIQUE: Wallace Cullens scale imaging, color Doppler and duplex ultrasound were performed of bilateral carotid and vertebral arteries in the neck. COMPARISON:  Head CT 12/21/2015 FINDINGS: Criteria: Quantification of carotid stenosis is based on velocity parameters that correlate the residual internal carotid diameter with NASCET-based stenosis levels, using the diameter of the distal internal carotid lumen as the denominator for stenosis measurement. The following velocity measurements were obtained: RIGHT ICA:  109/18 cm/sec CCA:  91/19 cm/sec SYSTOLIC ICA/CCA RATIO:  1.2 DIASTOLIC ICA/CCA RATIO:  0.9 ECA:  95 cm/sec LEFT ICA:  116/10 cm/sec CCA:  52/5 cm/sec SYSTOLIC ICA/CCA RATIO:  2.2 DIASTOLIC ICA/CCA RATIO:  2.1 ECA:  155 cm/sec RIGHT CAROTID ARTERY: Mild heterogeneous atherosclerotic plaque in the distal common carotid  artery extending into the proximal internal carotid artery. By  peak systolic velocity criteria the estimated stenosis remains less than 50%. RIGHT VERTEBRAL ARTERY:  Patent with normal antegrade flow. LEFT CAROTID ARTERY: Mild smooth heterogeneous plaque in the distal common carotid artery extending into the proximal internal carotid artery. By peak systolic velocity criteria the estimated stenosis remains less than 50%. LEFT VERTEBRAL ARTERY:  Patent with normal antegrade flow. IMPRESSION: 1. Mild (1-49%) stenosis proximal right internal carotid artery secondary to heterogenous atherosclerotic plaque. 2. Mild (1-49%) stenosis proximal left internal carotid artery secondary to heterogenous atherosclerotic plaque. 3. Vertebral arteries are patent with normal antegrade flow. Signed, Sterling Big, MD [facility] Radiology Electronically Signed   By: Malachy Moan M.D.   On: 12/22/2015 09:38   Dg Chest Portable 1 View  12/21/2015  CLINICAL DATA:  Unresponsive EXAM: PORTABLE CHEST 1 VIEW COMPARISON:  None. FINDINGS: Cardiac shadow is within normal limits. The lungs are well aerated bilaterally. No acute bony abnormality is seen. IMPRESSION: No active disease. Electronically Signed   By: Alcide Clever M.D.   On: 12/21/2015 17:38    Medications:  I have reviewed the patient's current medications. Scheduled: . aspirin EC  325 mg Oral Daily  . atorvastatin  40 mg Oral q1800  . clopidogrel  75 mg Oral Daily  . diltiazem  90 mg Oral BID  . enoxaparin (LOVENOX) injection  40 mg Subcutaneous BID  . insulin aspart  0-5 Units Subcutaneous QHS  . insulin aspart  0-9 Units Subcutaneous TID WC  . insulin glargine  10 Units Subcutaneous QHS  . sodium chloride flush  3 mL Intravenous Q12H    Assessment/Plan: Patient remains nonfluent.  MRI of the brain personally reviewed and shows bilateral acute infarcts.  Likely cause of syncope.  CTA of head and neck performed and reveals right MCA occlusion and distal left M1  occlusion.  There is moderate to severe bilateral PCA stenosis with normal vertebrals.  Patient initially not treated with tPA due to the fact that she was not felt to be an infarct.  On no antiplatelet therapy at home.  Echocardiogram made no suggestion of PFO.  EF 60-65%.  LDL 128.  Patient started on a statin.  Blood sugars poorly controlled.  Recommendations: 1.  ASA  and Plavix  for at least the next 3 months.   2.  TEE.  To be performed next week. 3.  PT/OT and Speech evaluations 4.  Agree with statin and improved blood sugar control.  Patient advised about compliance.   5.  Follow up with neurology as an outpatient.     LOS: 1 day   Thana Farr, MD Neurology [phone] 12/23/2015  11:37 AM

## 2015-12-23 NOTE — Evaluation (Addendum)
Clinical/Bedside Swallow Evaluation Patient Details  Name: Emily [name] MRN: [id number] Date of Birth: 09/20/1946  Today's Date: 12/23/2015 Time: SLP Start Time (ACUTE ONLY): 1300 SLP Stop Time (ACUTE ONLY): 1330 SLP Time Calculation (min) (ACUTE ONLY): 30 min  Past Medical History:  Past Medical History  Diagnosis Date  . Essential hypertension, benign   . Type II or unspecified type diabetes mellitus without mention of complication, uncontrolled   . Gout, unspecified   . Mixed hyperlipidemia   . Syncope and collapse    Past Surgical History:  Past Surgical History  Procedure Laterality Date  . Abdominal hysterectomy    . Tonsillectomy and adenoidectomy     HPI:  Emily [name] is an 70 y.o. female who reports that she was at church serving some food when she had a syncopal episode. Patient does not remember her syncopal episode and denies any prodromal symptoms like chest pain, palpitations, nausea, vomiting or any other associated symptoms. She does say about a year ago she had a similar episode. When she presented to the emergency room she was somewhat confused but while in the ED her mental status slowly improved and she returned to baseline. 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   Assessment / Plan / Recommendation Clinical Impression  Pt demonstrated no apparent oropharyngeal dysphagia and not at increased risk of aspiration. Pt demonstrated no overt cough or throat clear w/any tested consistency. Laryngeal elevation appeared adequate and vocal quality remained clear. Oral phase was [facility]. Pt able to adequately masticate and clear all consistencies appropriately. Oral mech exam revealed oral motor abilites to be [facility]. Recommend continue w/regular diet w/thin liquids. No SLP f/u indicated for swallowing. Discussed w/nsg and pt.     Aspiration Risk  No limitations    Diet  Recommendation Regular;Thin liquid   Liquid Administration via: Cup;Straw Medication Administration: Whole meds with liquid Supervision: Patient able to self feed Postural Changes: Seated upright at 90 degrees    Other  Recommendations Oral Care Recommendations: Oral care BID;Patient independent with oral care   Follow up Recommendations  None    Frequency and Duration min 2x/week          Prognosis Prognosis for Safe Diet Advancement: Good      Swallow Study   General HPI: Emily [name] is an 70 y.o. female who reports that she was at church serving some food when she had a syncopal episode. Patient does not remember her syncopal episode and denies any prodromal symptoms like chest pain, palpitations, nausea, vomiting or any other associated symptoms. She does say about a year ago she had a similar episode. When she presented to the emergency room she was somewhat confused but while in the ED her mental status slowly improved and she returned to baseline. 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 Type of Study: Bedside Swallow Evaluation Diet Prior to this Study: Regular Temperature Spikes Noted: No Respiratory Status: Room air History of Recent Intubation: No Behavior/Cognition: Alert;Cooperative;Pleasant mood Oral Cavity Assessment: Within Functional Limits Oral Care Completed by SLP: No Oral Cavity - Dentition: Adequate natural dentition Vision: Functional for self-feeding Self-Feeding Abilities: Able to feed self Patient Positioning: Upright in bed Baseline Vocal Quality: Normal Volitional Cough: Strong Volitional Swallow: Able to elicit    Oral/Motor/Sensory Function Overall Oral Motor/Sensory Function: Within functional limits   Ice Chips  Ice chips: Not tested   Thin Liquid Thin Liquid: Within functional limits Presentation: Cup;Straw    Nectar Thick Nectar Thick Liquid: Not  tested   Honey Thick Honey Thick Liquid: Not tested   Puree Puree: Within functional limits Presentation: Self Fed;Spoon   Solid   GO   Solid: Within functional limits Presentation: Self Fed        Butterfield,Maaran 12/23/2015,1:53 PM  Entered wrong start and stop time. Addend to Start: 12:30 and Stop: 13:00

## 2015-12-24 LAB — GLUCOSE, CAPILLARY
GLUCOSE-CAPILLARY: 206 mg/dL — AB (ref 65–99)
GLUCOSE-CAPILLARY: 240 mg/dL — AB (ref 65–99)
GLUCOSE-CAPILLARY: 260 mg/dL — AB (ref 65–99)
GLUCOSE-CAPILLARY: 265 mg/dL — AB (ref 65–99)

## 2015-12-24 MED ORDER — INSULIN GLARGINE 100 UNIT/ML ~~LOC~~ SOLN
12.0000 [IU] | Freq: Every day | SUBCUTANEOUS | Status: DC
  Administered 2015-12-24 – 2015-12-25 (×2): 12 [IU] via SUBCUTANEOUS
  Filled 2015-12-24 (×3): qty 0.12

## 2015-12-24 NOTE — Care Management Note (Signed)
Case Management Note  Patient Details  Name: Emily [name] MRN: [id number] Date of Birth: 1946-02-07  Subjective/Objective:  Call to Emily [name] at [facility] to make sure that a RN can see Emily [name] today. Currently awaiting [name] call-back.                   Action/Plan:   Expected Discharge Date:                  Expected Discharge Plan:     In-House Referral:     Discharge planning Services     Post Acute Care Choice:    Choice offered to:     DME Arranged:    DME Agency:     HH Arranged:    HH Agency:     Status of Service:     Medicare Important Message Given:    Date Medicare IM Given:    Medicare IM give by:    Date Additional Medicare IM Given:    Additional Medicare Important Message give by:     If discussed at Long Length of Stay Meetings, dates discussed:    Additional Comments:  Emily Heslin A, RN 12/24/2015, 10:10 AM

## 2015-12-24 NOTE — Progress Notes (Signed)
[facility] [place] [facility]   PATIENT NAME: Emily [name]    MR#:  [id number]  DATE OF BIRTH:  1946-03-21  SUBJECTIVE:  CHIEF COMPLAINT:   Chief Complaint  Patient presents with  . Altered Mental Status   No complaint. But the patient has difficulty finding words sometimes. REVIEW OF SYSTEMS:  CONSTITUTIONAL: No fever, fatigue or weakness.  EYES: No blurred or double vision.  EARS, NOSE, AND THROAT: No tinnitus or ear pain.  RESPIRATORY: No cough, shortness of breath, wheezing or hemoptysis.  CARDIOVASCULAR: No chest pain, orthopnea, edema.  GASTROINTESTINAL: No nausea, vomiting, diarrhea or abdominal pain.  GENITOURINARY: No dysuria, hematuria.  ENDOCRINE: No polyuria, nocturia,  HEMATOLOGY: No anemia, easy bruising or bleeding SKIN: No rash or lesion. MUSCULOSKELETAL: No joint pain or arthritis.   NEUROLOGIC: No tingling, numbness, weakness.  PSYCHIATRY: No anxiety or depression.   DRUG ALLERGIES:   Allergies  Allergen Reactions  . Enalapril     VITALS:  Blood pressure 148/62, pulse 66, temperature 98 F (36.7 C), temperature source Oral, resp. rate 18, height 5' 2.01" (1.575 m), weight 103.42 kg (228 lb), SpO2 97 %.  PHYSICAL EXAMINATION:  GENERAL:  70 y.o.-year-old patient lying in the bed with no acute distress. Obese. EYES: Pupils equal, round, reactive to light and accommodation. No scleral icterus. Extraocular muscles intact.  HEENT: Head atraumatic, normocephalic. Oropharynx and nasopharynx clear. Moist oral mucosa. NECK:  Supple, no jugular venous distention. No thyroid enlargement, no tenderness.  LUNGS: Normal breath sounds bilaterally, no wheezing, rales,rhonchi or crepitation. No use of accessory muscles of respiration.  CARDIOVASCULAR: S1, S2 normal. No murmurs, rubs, or gallops.  ABDOMEN: Soft, nontender, nondistended. Bowel sounds present. No organomegaly or mass.  EXTREMITIES: No pedal edema, cyanosis, or  clubbing.  NEUROLOGIC: Cranial nerves II through XII are intact. Muscle strength 5/5 in all extremities. Sensation intact. Gait not checked.  PSYCHIATRIC: The patient is alert and oriented x 2- 3.  SKIN: No obvious rash, lesion, or ulcer.    LABORATORY PANEL:   CBC  Recent Labs Lab 12/21/15 1643  WBC 6.2  HGB 15.3  HCT 45.5  PLT 203   ------------------------------------------------------------------------------------------------------------------  Chemistries   Recent Labs Lab 12/21/15 1643  NA 133*  K 3.5  CL 105  CO2 20*  GLUCOSE 353*  BUN 24*  CREATININE 0.92  CALCIUM 9.3  AST 16  ALT 12*  ALKPHOS 77  BILITOT 0.8   ------------------------------------------------------------------------------------------------------------------  Cardiac Enzymes  Recent Labs Lab 12/22/15 0758  TROPONINI <0.03   ------------------------------------------------------------------------------------------------------------------  RADIOLOGY:  Ct Angio Head W/cm &/or Wo Cm  12/22/2015  CLINICAL DATA:  Syncope.  Bilateral MCA infarcts on MRI. EXAM: CT ANGIOGRAPHY HEAD AND NECK TECHNIQUE: Multidetector CT imaging of the head and neck was performed using the standard protocol during bolus administration of intravenous contrast. Multiplanar CT image reconstructions and MIPs were obtained to evaluate the vascular anatomy. Carotid stenosis measurements (when applicable) are obtained utilizing NASCET criteria, using the distal internal carotid diameter as the denominator. CONTRAST:  100 mL Isovue 370 COMPARISON:  Head MRI and carotid Doppler ultrasound earlier today FINDINGS: CT HEAD Brain: Low density is seen in the right caudate, left insula, and posterior left temporal lobe corresponding to the acute infarcts demonstrated on MRI. A chronic left basal ganglia infarct is again seen with ex vacuo dilatation of the left frontal horn. Moderate chronic small vessel ischemic changes are again seen  in the cerebral white matter. No acute intracranial hemorrhage, mass,  or extra-axial fluid collection is seen. Calvarium and skull base: No acute osseous abnormality. Paranasal sinuses: Minimal right maxillary sinus mucosal thickening. Orbits: Unremarkable. CTA NECK Aortic arch: 3 vessel aortic arch. Brachiocephalic and subclavian arteries are widely patent. Right carotid system: Patent with mild atheromatous plaque at the carotid bifurcation. No significant stenosis. Left carotid system: Common carotid artery is patent without stenosis. There is narrowing of the proximal ICA at its origin, however this only measures approximately 30% by NASCET criteria given the small baseline size of the more distal vessel. Vertebral arteries: The vertebral arteries are patent without stenosis. Left is mildly dominant. Skeleton: Mild disc and moderate facet degeneration are present in the cervical spine. There is bilateral facet ankylosis at C7-T1. Other neck: No neck mass. CTA HEAD Anterior circulation: The internal carotid arteries are patent from skullbase to carotid termini with mild atheromatous plaque bilaterally. 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 There is mild left cavernous and mild bilateral supraclinoid ICA narrowing. The right MCA is occluded at its origin. There are small collateral type vessels in this region, and there is reconstitution of the more distal right M1 segment, although the patent right MCA branch vessels overall appear mildly attenuated in caliber diffusely. The left MCA is patent at its origin, however there is progressive proximal MCA narrowing with occlusion at the distal M1 level. There is some collateral supply of M2 and more distal branch vessels, though these overall appear attenuated. The right A1 segment is patent and supplies the left A2. There is mild ACA branch vessel irregularity.  No intracranial aneurysm is identified. Posterior circulation: Intracranial vertebral arteries are widely patent with the left being dominant. Right PICA arises near the dural reflection. AICA origins are patent. Basilar artery is patent without stenosis. Left SCA is patent. Suspected stenosis at the right SCA origin with the vessel poorly opacified distal to this. There are moderate to severe stenoses of the proximal and mid left P2 segment, and there are mild proximal and severe distal P2 stenoses on the right with additional more distal branch vessel irregular narrowing bilaterally. Venous sinuses: Patent. Left transverse and sigmoid sinuses are dominant. Anatomic variants: Severely hypoplastic left A1. Delayed phase: No abnormal enhancement identified. IMPRESSION: 1. Advanced intracranial atherosclerosis. 2. 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 3. Distal left M1 occlusion with some collateral supply of attenuated and irregular more distal left MCA branch vessels. 4. Moderate to severe bilateral P2 PCA stenoses. 5. Mild bilateral intracranial ICA stenosis. 6. 30% left ICA stenosis at its origin. 7. No vertebral or right-sided cervical carotid artery stenosis. Electronically Signed   By: Sebastian Ache M.D.   On: 12/22/2015 18:12   Ct Angio Neck W/cm &/or Wo/cm  12/22/2015  CLINICAL DATA:  Syncope.  Bilateral MCA infarcts on MRI. EXAM: CT ANGIOGRAPHY HEAD AND NECK TECHNIQUE: Multidetector CT imaging of the head and neck was performed using the standard protocol during bolus administration of intravenous contrast. Multiplanar CT image reconstructions and MIPs were obtained to evaluate the vascular anatomy. Carotid stenosis measurements (when applicable) are obtained utilizing NASCET criteria, using the distal internal carotid diameter as the denominator. CONTRAST:  100 mL Isovue 370 COMPARISON:  Head MRI and carotid  Doppler ultrasound earlier today FINDINGS: CT HEAD Brain: Low density is seen in the right caudate, left insula, and posterior  left temporal lobe corresponding to the acute infarcts demonstrated on MRI. A chronic left basal ganglia infarct is again seen with ex vacuo dilatation of the left frontal horn. Moderate chronic small vessel ischemic changes are again seen in the cerebral white matter. No acute intracranial hemorrhage, mass, or extra-axial fluid collection is seen. Calvarium and skull base: No acute osseous abnormality. Paranasal sinuses: Minimal right maxillary sinus mucosal thickening. Orbits: Unremarkable. CTA NECK Aortic arch: 3 vessel aortic arch. Brachiocephalic and subclavian arteries are widely patent. Right carotid system: Patent with mild atheromatous plaque at the carotid bifurcation. No significant stenosis. Left carotid system: Common carotid artery is patent without stenosis. There is narrowing of the proximal ICA at its origin, however this only measures approximately 30% by NASCET criteria given the small baseline size of the more distal vessel. Vertebral arteries: The vertebral arteries are patent without stenosis. Left is mildly dominant. Skeleton: Mild disc and moderate facet degeneration are present in the cervical spine. There is bilateral facet ankylosis at C7-T1. Other neck: No neck mass. CTA HEAD Anterior circulation: The internal carotid arteries are patent from skullbase to carotid termini with mild atheromatous plaque bilaterally. 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 There is mild left cavernous and mild bilateral supraclinoid ICA narrowing. The right MCA is occluded at its origin. There are small collateral type vessels in this region, and there is reconstitution of the more distal right M1 segment, although the patent right MCA branch vessels overall appear mildly  attenuated in caliber diffusely. The left MCA is patent at its origin, however there is progressive proximal MCA narrowing with occlusion at the distal M1 level. There is some collateral supply of M2 and more distal branch vessels, though these overall appear attenuated. The right A1 segment is patent and supplies the left A2. There is mild ACA branch vessel irregularity. No intracranial aneurysm is identified. Posterior circulation: Intracranial vertebral arteries are widely patent with the left being dominant. Right PICA arises near the dural reflection. AICA origins are patent. Basilar artery is patent without stenosis. Left SCA is patent. Suspected stenosis at the right SCA origin with the vessel poorly opacified distal to this. There are moderate to severe stenoses of the proximal and mid left P2 segment, and there are mild proximal and severe distal P2 stenoses on the right with additional more distal branch vessel irregular narrowing bilaterally. Venous sinuses: Patent. Left transverse and sigmoid sinuses are dominant. Anatomic variants: Severely hypoplastic left A1. Delayed phase: No abnormal enhancement identified. IMPRESSION: 1. Advanced intracranial atherosclerosis. 2. 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 3. Distal left M1 occlusion with some collateral supply of attenuated and irregular more distal left MCA branch vessels. 4. Moderate to severe bilateral P2 PCA stenoses. 5. Mild bilateral intracranial ICA stenosis. 6. 30% left ICA stenosis at its origin. 7. No vertebral or right-sided cervical carotid artery stenosis. Electronically Signed   By: Sebastian Ache M.D.   On: 12/22/2015 18:12   Mr Brain Wo Contrast  12/22/2015  CLINICAL DATA:  Speech disturbance. Syncope and altered mental status. EXAM: MRI HEAD WITHOUT CONTRAST TECHNIQUE: Multiplanar, multiecho pulse sequences of the brain and surrounding structures  were obtained without intravenous contrast. COMPARISON:  Head CT 12/21/2015 FINDINGS: There are small foci of restricted diffusion in both MCA territories consistent with acute infarcts involving the right  caudate head, right caudate body/corona radiata, right frontal operculum, left insula and subinsular white matter, and posterior left temporal lobe. The largest infarcts measure approximately 1.5 cm each in the right caudate and left temporal lobe. No hemorrhage is identified associated with these infarcts. There is a chronic hemorrhagic infarct in the left basal ganglia with ex vacuo enlargement of the left frontal horn. There is slight leftward midline shift at this level due to volume loss. The ventricles are otherwise normal in size for age. Patchy T2 hyperintensities in the periventricular greater than subcortical cerebral white matter are compatible with moderate chronic small vessel ischemic disease. Orbits are unremarkable. There is minimal right maxillary sinus mucosal thickening, and there is a trace left mastoid effusion. The intracranial left ICA flow void is mildly abnormal in appearance with predominantly peripherally increased T2/ FLAIR signal, most notable in the petrous and cavernous portions. There is also hyperintense FLAIR signal involving the MCAs which may reflect decreased flow. IMPRESSION: 1. Multiple small acute infarcts involving both MCA territories as above suggestive of emboli. 2. Chronic left basal ganglia hemorrhagic infarct. 3. Moderate chronic small vessel ischemic disease. 4. Abnormal left ICA flow void which may reflect underlying atherosclerotic stenosis though dissection is not excluded. Consider further evaluation with head and neck CTA. Electronically Signed   By: Sebastian AcheAllen  Grady M.D.   On: 12/22/2015 14:58    EKG:   Orders placed or performed during the hospital encounter of 12/21/15  . ED EKG  . ED EKG  . EKG 12-Lead  . EKG 12-Lead    ASSESSMENT AND PLAN:   Acute  CVA, multiple small acute infarct per MRI. CT of head and neck with contrastreveals right MCA occlusion and distal left M1 occlusion. There is moderate to severe bilateral PCA stenosis with normal vertebrals. . Echocardiogram: Normal ejection fraction. Continue ASA 325 mg po daily, Lipitor, add Plavix 75 mg by mouth daily for the next 3 months, Follow-up neurologist as outpatient per Dr. Thad Rangereynolds.  Neurologist suggested TEE. Per Dr. Darrold JunkerParaschos this cannot be done until Monday or later,  or can be done as outpatient.   PT evaluation didn't recommend home PT.  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 Continue aspirin and Lipitor.  Hyperlipidemia. LDL 128. Continue Lipitor.  Syncope. Possible due to acute CVA/dehydration. Fall precaution.  Accelerated hypertension, continue Cardizem with hydralazine IV when necessary.  Dehydration and hyponatremia. She was treated with normal saline IV.  Diabetes. Hold metformin, on sliding scale and Lantus 12 units at bedtime. Resume metformin after discharge.  Obesity.  All the records are reviewed and case discussed with Care Management/Social Workerr and speech study staff. Management plans discussed with the patient, she is in agreement to be discharged.  But per  case manager, home health staff cannot see the patient today. Maybe discharge tomorrow  CODE STATUS: full code.  TOTAL TIME TAKING CARE OF THIS PATIENT: 33  minutes.  Greater than 50% time was spent on coordination of care and face-to-face counseling.  POSSIBLE D/C IN 2 DAYS, DEPENDING ON CLINICAL CONDITION.   Shaune Pollackhen, Yasha Tibbett M.D on 12/24/2015 at 12:48 PM  Between 7am to 6pm - Pager - [phone]  After 6pm go to [email or web address] - password [email or web address] [email or web address]  [facility]  Office  [phone]  CC: Primary care physician; Wonda ChengMOFFETT, JOEL, MD

## 2015-12-24 NOTE — Care Management Note (Signed)
Case Management Note  Patient Details  Name: Emily LoHelen H [name] MRN: [id number] Date of Birth: 1946-03-30  Subjective/Objective:     Just got off the phone with Britta [facility]ccreedyBarbara from [facility]. Have called Britta MccreedyBarbara and [place] numerous times this morning to find out when the start of care will be for home health. For some unknown reason [facility] cannot tell me when they can see [name], either this afternoon or tomorrow. Continue to wait for a response from [facility].              Action/Plan:   Expected Discharge Date:                  Expected Discharge Plan:     In-House Referral:     Discharge planning Services     Post Acute Care Choice:    Choice offered to:     DME Arranged:    DME Agency:     HH Arranged:    HH Agency:     Status of Service:     Medicare Important Message Given:    Date Medicare IM Given:    Medicare IM give by:    Date Additional Medicare IM Given:    Additional Medicare Important Message give by:     If discussed at Long Length of Stay Meetings, dates discussed:    Additional Comments:  Adabelle Griffiths A, RN 12/24/2015, 12:33 PM

## 2015-12-24 NOTE — Care Management Note (Signed)
Case Management Note  Patient Details  Name: Emily [name] MRN: [id number] Date of Birth: Jul 26, 1946  Subjective/Objective:    Received call back from [facility]. They have [name] nurse scheduled to see Emily [name] on Monday. This information was provided to Dr Imogene Burnhen who does not want to discharge Emily [name] home until the day the home health nurse can see her. Both Dr Imogene Burnhen and Emily [name] have been advised by this writer that [facility] will only provide temporary home health services for [name] few hours [name] week.  This Clinical research associatewriter provided Emily [name] with [name] list of private pay providers to contact if the family wants long term or daily care. (see CM note from 12/23/15)               Action/Plan:   Expected Discharge Date:                  Expected Discharge Plan:     In-House Referral:     Discharge planning Services     Post Acute Care Choice:    Choice offered to:     DME Arranged:    DME Agency:     HH Arranged:    HH Agency:     Status of Service:     Medicare Important Message Given:    Date Medicare IM Given:    Medicare IM give by:    Date Additional Medicare IM Given:    Additional Medicare Important Message give by:     If discussed at Long Length of Stay Meetings, dates discussed:    Additional Comments:  Emily Wisinski A, RN 12/24/2015, 12:47 PM

## 2015-12-24 NOTE — Progress Notes (Signed)
Not[facility] Dr [facility]hen [facility] home health RN will not be able to see pt today if discharged. MD to d/c discharge order.

## 2015-12-25 LAB — GLUCOSE, CAPILLARY
GLUCOSE-CAPILLARY: 238 mg/dL — AB (ref 65–99)
Glucose-Capillary: 230 mg/dL — ABNORMAL HIGH (ref 65–99)
Glucose-Capillary: 253 mg/dL — ABNORMAL HIGH (ref 65–99)
Glucose-Capillary: 217 mg/dL — ABNORMAL HIGH (ref 65–99)

## 2015-12-25 LAB — CREATININE, SERUM
CREATININE: 0.75 mg/dL (ref 0.44–1.00)
GFR calc non Af Amer: >60 mL/min (ref >60)

## 2015-12-25 MED ORDER — POLYETHYLENE GLYCOL 3350 17 G PO PACK
17.0000 g | PACK | Freq: Every day | ORAL | Status: DC
  Administered 2015-12-26: 13:00:00 17 g via ORAL
  Filled 2015-12-25 (×2): qty 1

## 2015-12-25 MED ORDER — METFORMIN HCL 500 MG PO TABS
1000.0000 mg | ORAL_TABLET | Freq: Two times a day (BID) | ORAL | Status: DC
Start: 2015-12-25 — End: 2015-12-26
  Administered 2015-12-25 – 2015-12-26 (×2): 1000 mg via ORAL
  Filled 2015-12-25 (×2): qty 2

## 2015-12-25 MED ORDER — SENNOSIDES-DOCUSATE SODIUM 8.6-50 MG PO TABS
2.0000 | ORAL_TABLET | Freq: Two times a day (BID) | ORAL | Status: DC
  Administered 2015-12-25 – 2015-12-26 (×2): 2 via ORAL
  Filled 2015-12-25 (×3): qty 2

## 2015-12-25 MED ORDER — SODIUM CHLORIDE 0.9 % IV SOLN
INTRAVENOUS | Status: DC
  Administered 2015-12-26: 02:00:00 via INTRAVENOUS

## 2015-12-25 NOTE — Progress Notes (Signed)
Patient prepared for transport to specials procedure area for transesophageal echo. Patient reports she ate a full breakfast this morning. We'll need to defer this procedure secondary to by mouth intake. We'll reschedule for tomorrow morning. Patient advised of nothing by mouth status.

## 2015-12-25 NOTE — Progress Notes (Signed)
[facility] [place] [facility]   PATIENT NAME: Emily DarkHelen [name]    MR#:  [id number]  DATE OF BIRTH:  05-24-1946  SUBJECTIVE:  CHIEF COMPLAINT:   Chief Complaint  Patient presents with  . Altered Mental Status  wants to go home, waiting for TEE REVIEW OF SYSTEMS:  CONSTITUTIONAL: No fever, fatigue or weakness.  EYES: No blurred or double vision.  EARS, NOSE, AND THROAT: No tinnitus or ear pain.  RESPIRATORY: No cough, shortness of breath, wheezing or hemoptysis.  CARDIOVASCULAR: No chest pain, orthopnea, edema.  GASTROINTESTINAL: No nausea, vomiting, diarrhea or abdominal pain.  GENITOURINARY: No dysuria, hematuria.  ENDOCRINE: No polyuria, nocturia,  HEMATOLOGY: No anemia, easy bruising or bleeding SKIN: No rash or lesion. MUSCULOSKELETAL: No joint pain or arthritis.   NEUROLOGIC: No tingling, numbness, weakness.  PSYCHIATRY: No anxiety or depression.   DRUG ALLERGIES:   Allergies  Allergen Reactions  . Enalapril     VITALS:  Blood pressure 148/73, pulse 66, temperature 98.6 F (37 C), temperature source Oral, resp. rate 18, height 5' 2.01" (1.575 m), weight 103.42 kg (228 lb), SpO2 96 %. PHYSICAL EXAMINATION:  GENERAL:  70 y.o.-year-old patient lying in the bed with no acute distress. Obese. EYES: Pupils equal, round, reactive to light and accommodation. No scleral icterus. Extraocular muscles intact.  HEENT: Head atraumatic, normocephalic. Oropharynx and nasopharynx clear. Moist oral mucosa. NECK:  Supple, no jugular venous distention. No thyroid enlargement, no tenderness.  LUNGS: Normal breath sounds bilaterally, no wheezing, rales,rhonchi or crepitation. No use of accessory muscles of respiration.  CARDIOVASCULAR: S1, S2 normal. No murmurs, rubs, or gallops.  ABDOMEN: Soft, nontender, nondistended. Bowel sounds present. No organomegaly or mass.  EXTREMITIES: No pedal edema, cyanosis, or clubbing.  NEUROLOGIC: Cranial nerves II  through XII are intact. Muscle strength 5/5 in all extremities. Sensation intact. Gait not checked.  PSYCHIATRIC: The patient is alert and oriented x 2- 3.  SKIN: No obvious rash, lesion, or ulcer.  LABORATORY PANEL:   CBC  Recent Labs Lab 12/21/15 1643  WBC 6.2  HGB 15.3  HCT 45.5  PLT 203   ------------------------------------------------------------------------------------------------------------------  Chemistries   Recent Labs Lab 12/21/15 1643  NA 133*  K 3.5  CL 105  CO2 20*  GLUCOSE 353*  BUN 24*  CREATININE 0.92  CALCIUM 9.3  AST 16  ALT 12*  ALKPHOS 77  BILITOT 0.8   ASSESSMENT AND PLAN:  * Acute CVA, multiple small acute infarct per MRI. 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 There is moderate to severe bilateral PCA stenosis with normal vertebrals. . Echocardiogram: Normal ejection fraction. Continue ASA 325 mg po daily, Lipitor, add Plavix 75 mg by mouth daily for the next 3 months, Follow-up neurologist as outpatient per Dr. Thad Rangereynolds. - waiting for TEE today, d/w Dr Lady GaryFath - PT evaluation didn't recommend home PT. - Bilateral ICA Mild (1-49%) stenosis. Continue aspirin and Lipitor. - Hyperlipidemia. LDL 128. Continue Lipitor.  * Syncope. Possible due to acute CVA/dehydration. Fall precaution.  * Accelerated hypertension, continue Cardizem with hydralazine IV when necessary.  * Dehydration and hyponatremia: Na 133. continue normal saline IV.  * Diabetes: Hba1c > 12, resume metformin as sugars in 300s, on sliding scale and Lantus 12 units at bedtime. DM nurse c/s  * Obesity.  * Constipation: stool softners. No BM since last Thursday per pt.   All the records are reviewed and case discussed with Care Management/Social  Workerr and Magazine features editor. Management plans discussed with the patient, she is in agreement to be discharged.  But per  case manager, home health staff cannot see the patient  today. Maybe discharge tomorrow  CODE STATUS: full code.  TOTAL TIME TAKING CARE OF THIS PATIENT: 33  minutes.  Greater than 50% time was spent on coordination of care and face-to-face counseling.  POSSIBLE D/C IN AM, DEPENDING ON CLINICAL CONDITION. Once finish TEE depending on results.   [facility], Greycen Felter M.D on 12/25/2015 at 8:34 AM  Between 7am to 6pm - Pager - [phone]  After 6pm go to [email or web address] - password [email or web address] [facility]  [place] [facility]  Office  [phone]  CC: Primary care physician; Wonda Cheng, MD

## 2015-12-25 NOTE — Care Management (Signed)
Spoke with Dr. Sherryll BurgerShah. Neurology is recommending TEE prior to discharge. This procedure will be completed today. Discharge to home tomorrow per Dr. Sherryll BurgerShah. Discussed with Ms. Bender. States she is in agreement with plan. Will update [facility]. Gwenette GreetBrenda S Zeniya Lapidus RN MSN CCM Care Management  [phone]

## 2015-12-25 NOTE — Care Management Important Message (Signed)
Important Message  Patient Details  Name: Emily [name] MRN: [id number] Date of Birth: September 27, 1945   Medicare Important Message Given:  Yes    Olegario MessierKathy A Quintel Mccalla 12/25/2015, 10:40 AM

## 2015-12-25 NOTE — Progress Notes (Signed)
Inpatient Diabetes Program Recommendations  AACE/ADA: New Consensus Statement on Inpatient Glycemic Control (2015)  Target Ranges:  Prepandial:   less than 140 mg/dL      Peak postprandial:   less than 180 mg/dL (1-2 hours)      Critically ill patients:  140 - 180 mg/dL   Review of Glycemic Control  Results for Emily [name], [name] (MRN [id number]) as of 12/25/2015 08:56  Ref. Range 12/24/2015 07:40 12/24/2015 11:57 12/24/2015 16:35 12/24/2015 21:19 12/25/2015 07:29  Glucose-Capillary Latest Ref Range: 65-99 mg/dL 409206 (H) 811240 (H) 914260 (H) 265 (H) 230 (H)     Diabetes history: Type 2 Outpatient Diabetes medications: Glucophage 500-1000mg  bid Current orders for Inpatient glycemic control: Glucophage 500mg  qam, Glucophage 1000mg  qpm, Novolog 0-9 units tid, Novolog 0-5 units qhs  Recommendations:   Requested the following recommendations on 12/22/15-D/C Metformin while the patient is in hospital. Consider increasing Novolog correction to moderate (given her weight) tid.   Please change diet to heart healthy/carb modified.  A1C 12.7% on 12/23/15  Please consider starting Lantus 21 units qday, starting now. D/C Metformin while the patient is in hospital. Consider starting the patient on Novolog 5 units tid with meals and increasing Novolog correction to moderate (given her weight) tid.  Please change diet to heart healthy/carb modified.   Susette RacerJulie Pascuala Klutts, RN, BA, MHA, CDE Diabetes Coordinator Inpatient Diabetes Program  [phone] (Team Pager) [phone] [facility] Office) 12/25/2015 9:00 AM

## 2015-12-26 ENCOUNTER — Encounter: Payer: Self-pay | Admitting: Cardiology

## 2015-12-26 ENCOUNTER — Inpatient Hospital Stay
Admit: 2015-12-26 | Discharge: 2015-12-26 | Disposition: A | Discharge status: Home / Self Care | Payer: Medicare Other | Attending: Cardiology | Admitting: Cardiology

## 2015-12-26 ENCOUNTER — Encounter
Admission: EM | Disposition: A | Discharge status: Home Health | Payer: Self-pay | Source: Home / Self Care | Attending: Internal Medicine

## 2015-12-26 HISTORY — PX: TEE WITHOUT CARDIOVERSION: SHX5443

## 2015-12-26 LAB — GLUCOSE, CAPILLARY
GLUCOSE-CAPILLARY: 216 mg/dL — AB (ref 65–99)
Glucose-Capillary: 203 mg/dL — ABNORMAL HIGH (ref 65–99)

## 2015-12-26 SURGERY — ECHOCARDIOGRAM, TRANSESOPHAGEAL
Anesthesia: Moderate Sedation

## 2015-12-26 MED ORDER — INSULIN GLARGINE 100 UNIT/ML ~~LOC~~ SOLN: 12.0000 [IU] | Freq: Every day | SUBCUTANEOUS | Status: DC

## 2015-12-26 MED ORDER — METFORMIN HCL 1000 MG PO TABS: 1000.0000 mg | ORAL_TABLET | Freq: Two times a day (BID) | ORAL | Status: DC

## 2015-12-26 MED ORDER — POLYETHYLENE GLYCOL 3350 17 G PO PACK: 17.0000 g | PACK | Freq: Every day | ORAL | Status: AC

## 2015-12-26 MED ORDER — MIDAZOLAM HCL 2 MG/2ML IJ SOLN
INTRAMUSCULAR | Status: AC | PRN
  Administered 2015-12-26: 2 mg via INTRAVENOUS

## 2015-12-26 MED ORDER — BUTAMBEN-TETRACAINE-BENZOCAINE 2-2-14 % EX AERO
INHALATION_SPRAY | CUTANEOUS | Status: AC
  Filled 2015-12-26: qty 20

## 2015-12-26 MED ORDER — FENTANYL CITRATE (PF) 100 MCG/2ML IJ SOLN
INTRAMUSCULAR | Status: AC
  Filled 2015-12-26: qty 2

## 2015-12-26 MED ORDER — MIDAZOLAM HCL 5 MG/5ML IJ SOLN
INTRAMUSCULAR | Status: AC | PRN
  Administered 2015-12-26 (×2): 1 mg via INTRAVENOUS

## 2015-12-26 MED ORDER — FENTANYL CITRATE (PF) 100 MCG/2ML IJ SOLN
INTRAMUSCULAR | Status: AC | PRN
  Administered 2015-12-26: 25 ug via INTRAVENOUS
  Administered 2015-12-26: 50 ug via INTRAVENOUS
  Administered 2015-12-26: 25 ug via INTRAVENOUS

## 2015-12-26 MED ORDER — ASPIRIN EC 325 MG PO TBEC: 325.0000 mg | DELAYED_RELEASE_TABLET | Freq: Every day | ORAL | Status: DC

## 2015-12-26 MED ORDER — MIDAZOLAM HCL 5 MG/5ML IJ SOLN
INTRAMUSCULAR | Status: AC
  Filled 2015-12-26: qty 5

## 2015-12-26 MED ORDER — LIDOCAINE VISCOUS 2 % MT SOLN
OROMUCOSAL | Status: DC
Start: 2015-12-26 — End: 2015-12-26
  Filled 2015-12-26: qty 15

## 2015-12-26 NOTE — Progress Notes (Signed)
Speech Therapy Note: reviewed chart notes; pt having procedure this morning. ST will f/u when pt is available time permitting today; otherwise, tomorrow.

## 2015-12-26 NOTE — Procedures (Signed)
    TRANSESOPHAGEAL ECHOCARDIOGRAM   NAME:  Emily [name]   MRN: [id number] DOB:  07/01/1946   ADMIT DATE: 12/21/2015  INDICATIONS:cva   PROCEDURE:   Informed consent was obtained prior to the procedure. The risks, benefits and alternatives for the procedure were discussed and the patient comprehended these risks.  Risks include, but are not limited to, cough, sore throat, vomiting, nausea, somnolence, esophageal and stomach trauma or perforation, bleeding, low blood pressure, aspiration, pneumonia, infection, trauma to the teeth and death.    After a procedural time-out, the patient was given 3 mg versed and 75 mcg fentanyl for moderate sedation.  The oropharynx was anesthetized 1 cc of topical 1% viscous lidocaine.  The transesophageal probe was inserted in the esophagus and stomach without difficulty and multiple views were obtained.    COMPLICATIONS:    There were no immediate complications.  FINDINGS:  LEFT VENTRICLE: EF = 55%. No regional wall motion abnormalities.  RIGHT VENTRICLE: Normal size and function.   LEFT ATRIUM: Normal with no thrombus  LEFT ATRIAL APPENDAGE: No thrombus.   RIGHT ATRIUM: Normal  AORTIC VALVE:  Trileaflet. No vegetations or thrombus  MITRAL VALVE:    Normal. No vegetations or thrombus  TRICUSPID VALVE: Normal.  PULMONIC VALVE: Grossly normal.  INTERATRIAL SEPTUM: No PFO or ASD. Agitated saline contrast injected   PERICARDIUM: No effusion  DESCENDING AORTA:   CONCLUSION:  No cardiac source of emboli

## 2015-12-26 NOTE — Progress Notes (Addendum)
Inpatient Diabetes Program Recommendations  AACE/ADA: New Consensus Statement on Inpatient Glycemic Control (2015)  Target Ranges:  Prepandial:   less than 140 mg/dL      Peak postprandial:   less than 180 mg/dL (1-2 hours)      Critically ill patients:  140 - 180 mg/dL  Results for Emily [name], Emily [name] (MRN [id number]) as of 12/26/2015 08:41  Ref. Range 12/25/2015 07:29 12/25/2015 11:07 12/25/2015 16:09 12/25/2015 21:44 12/26/2015 08:19  Glucose-Capillary Latest Ref Range: 65-99 mg/dL 409230 ([name]) 811253 ([name]) 914238 ([name]) 217 ([name]) 203 ([name])   Review of Glycemic Control  Diabetes history: DM2 Outpatient Diabetes medications: Metformin 1500 mg QAM, Metformin 1000 mg QPM, Amaryl 2 mg QAM Current orders for Inpatient glycemic control: Lantus 12 units QHS, Novolog 0-9 units TID with meals, Novolog 0-5 units QHS, Metformin 1000 mg BID  Inpatient Diabetes Program Recommendations: Insulin - Basal: Please consider increasing Lantus to 15 units QHS (based on 103 kg x 0.15 units). Insulin - Meal Coverage: Please consider ordering Novolog 3 units TID with meals for meal coverage (in addition to Novolog correction scale). HgbA1C: A1C 12.7% on 12/23/15 indicating an average glucose of 318 mg/dl. Patient needs to folllow up with PCP or Endocrinologist to improve diabetes control.  Addendum: 12/26/15@14 :43-Spoke with patient about diabetes and home regimen for diabetes control. Patient reports that she has not seen a primary care doctor or an endocrinologist in a very long time and states that she will probably need to find new doctors because it has been so long since she seen a primary care doctor or an endocrinologist. Will place consult for Case manager to assist with follow up care. Patient states that she is taking pills at home for diabetes management. Inquired about whether patient actually has DM medications at home since she has not seen a doctor in a long time. Patient states that she does have 2 DM medications at home and she has  been taking them as prescribed. However, she is not able to tell me what the names of the medications are at this time. Patient states that she has taken insulin in the past with vial and syringe. However, she took herself off of insulin when she ran out of it "a long time ago".  Patient states that she checks her glucose 2-3 times per month with the last time about 3 weeks ago but she does not recall what her glucose was at that time. Discussed A1C results (12.7% on 12/23/15) and explained that his current A1C indicates an average glucose of 319 mg/dl over the past 2-3 months. Discussed glucose and A1C goals. Discussed importance of checking CBGs and maintaining good CBG control to prevent long-term and short-term complications. Explained how hyperglycemia leads to damage within blood vessels which lead to the common complications seen with uncontrolled diabetes. Stressed to the patient the importance of improving glycemic control to prevent further complications from uncontrolled diabetes. Discussed impact of nutrition, exercise, stress, sickness, and medications on diabetes control. Encouraged patient to check her glucose 4 times per day (before meals and at bedtime) and to keep a log book of glucose readings and DM medications taken which she will need to take to doctor appointments. Explained how the doctor she follows up with can use the log book to continue to make insulin adjustments if needed. Patient verbalized understanding of information discussed and she states that she has no further questions at this time related to diabetes.  If MD plans to discharge patient on insulin,  bedside nursing will need to re-educate on insulin administration and use each patient interaction to provide diabetes education. NURSING: Please allow patient to be actively engaged with diabetes management by allowing patient to check own glucose and self-administer insulin injections (if plan is to discharge on insulin).    Thanks, Orlando Penner, RN, MSN, CDE Diabetes Coordinator Inpatient Diabetes Program [phone] (Team Pager from 8am to 5pm) [phone] ([facility] office) [phone] [facility] office) [phone] [facility] office)'

## 2015-12-26 NOTE — Progress Notes (Signed)
*  PRELIMINARY RESULTS* Echocardiogram Echocardiogram Transesophageal has been performed.  Emily HousekeeperJerry R [name] 12/26/2015, 9:33 AM

## 2015-12-28 MED ORDER — METFORMIN HCL 1000 MG PO TABS: 1000.0000 mg | ORAL_TABLET | Freq: Two times a day (BID) | ORAL | Status: DC

## 2015-12-29 NOTE — Discharge Summary (Signed)
[facility] [place] [facility]   PATIENT NAME: Emily [name]    MR#:  [id number]  DATE OF BIRTH:  24-Mar-1946  DATE OF ADMISSION:  12/21/2015 ADMITTING PHYSICIAN: Houston Siren, MD  DATE OF DISCHARGE: 12/26/2015  5:28 PM  PRIMARY CARE PHYSICIAN: MOFFETT, JOEL, MD    ADMISSION DIAGNOSIS:  CVA (cerebral infarction) [I63.9] Elevated troponin [R79.89] Altered mental status, unspecified altered mental status type [R41.82]  DISCHARGE DIAGNOSIS:  Active Problems:   Syncope   Acute CVA (cerebrovascular accident) (HCC)  SECONDARY DIAGNOSIS:   Past Medical History  Diagnosis Date  . Essential hypertension, benign   . Type II or unspecified type diabetes mellitus without mention of complication, uncontrolled   . Gout, unspecified   . Mixed hyperlipidemia   . Syncope and collapse    HOSPITAL COURSE:  70 y.o. female with a known history of essential hypertension, gout, hyperlipidemia previous history of syncope admitted to the hospital due to syncope and altered mental status. Says she was at church serving some food when she had a syncopal episode.  * Acute CVA, multiple small acute infarct per MRI. 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 Possible due to acute CVA/dehydration  * Accelerated hypertension: better controlled with cardizem  * Dehydration and hyponatremia: improved with NS IV  * Diabetes: Hba1c > 12 suggestive of poor control  * Obesity.  * Constipation: on stool softners  Patient didn't have any residual deficits from acute stroke and was D/C home in stable condition.  DISCHARGE CONDITIONS:   stable  CONSULTS OBTAINED:  Treatment Team:  Thana Farr, MD Marcina Millard, MD Dalia Heading, MD  DRUG ALLERGIES:   Allergies   Allergen Reactions  . Enalapril     DISCHARGE MEDICATIONS:   Discharge Medication List as of 12/26/2015  3:47 PM    START taking these medications   Details  aspirin EC 325 MG tablet Take 1 tablet (325 mg total) by mouth daily., Starting 12/26/2015, Until Discontinued, Normal    atorvastatin (LIPITOR) 40 MG tablet Take 1 tablet (40 mg total) by mouth daily at 6 PM., Starting 12/23/2015, Until Discontinued, Print    clopidogrel (PLAVIX) 75 MG tablet Take 1 tablet (75 mg total) by mouth daily., Starting 12/23/2015, Until Discontinued, Print    insulin glargine (LANTUS) 100 UNIT/ML injection Inject 0.12 mLs (12 Units total) into the skin at bedtime., Starting 12/26/2015, Until Discontinued, Normal    polyethylene glycol (MIRALAX / GLYCOLAX) packet Take 17 g by mouth daily., Starting 12/26/2015, Until Discontinued, Normal      CONTINUE these medications which have CHANGED   Details  metFORMIN (GLUCOPHAGE) 1000 MG tablet Take 1 tablet (1,000 mg total) by mouth 2 (two) times daily with a meal., Starting 12/26/2015, Until Discontinued, Normal      CONTINUE these medications which have NOT CHANGED   Details  diltiazem (CARDIZEM SR) 90 MG 12 hr capsule Take 90 mg by mouth 2 (two) times daily., Until Discontinued, Historical Med      STOP taking these medications     pravastatin (PRAVACHOL) 40 MG tablet          DISCHARGE INSTRUCTIONS:    DIET:  Cardiac diet  DISCHARGE CONDITION:  Good  ACTIVITY:  Activity as tolerated  OXYGEN:  Home Oxygen: No.   Oxygen Delivery: room  air  DISCHARGE LOCATION:  home   If you experience worsening of your admission symptoms, develop shortness of breath, life threatening emergency, suicidal or homicidal thoughts you must seek medical attention immediately by calling 911 or calling your MD immediately  if symptoms less severe.  You Must read complete instructions/literature along with all the possible adverse reactions/side effects for all the  Medicines you take and that have been prescribed to you. Take any new Medicines after you have completely understood and accpet all the possible adverse reactions/side effects.   Please note  You were cared for by a hospitalist during your hospital stay. If you have any questions about your discharge medications or the care you received while you were in the hospital after you are discharged, you can call the unit and asked to speak with the hospitalist on call if the hospitalist that took care of you is not available. Once you are discharged, your primary care physician will handle any further medical issues. Please note that NO REFILLS for any discharge medications will be authorized once you are discharged, as it is imperative that you return to your primary care physician (or establish a relationship with a primary care physician if you do not have one) for your aftercare needs so that they can reassess your need for medications and monitor your lab values.    On the day of Discharge:  VITAL SIGNS:  Blood pressure 144/82, pulse 96, temperature 98.2 F (36.8 C), temperature source Oral, resp. rate 21, height 5' 2.01" (1.575 m), weight 103.42 kg (228 lb), SpO2 100 %. PHYSICAL EXAMINATION:  GENERAL:  70 y.o.-year-old patient lying in the bed with no acute distress.  EYES: Pupils equal, round, reactive to light and accommodation. No scleral icterus. Extraocular muscles intact.  HEENT: Head atraumatic, normocephalic. Oropharynx and nasopharynx clear.  NECK:  Supple, no jugular venous distention. No thyroid enlargement, no tenderness.  LUNGS: Normal breath sounds bilaterally, no wheezing, rales,rhonchi or crepitation. No use of accessory muscles of respiration.  CARDIOVASCULAR: S1, S2 normal. No murmurs, rubs, or gallops.  ABDOMEN: Soft, non-tender, non-distended. Bowel sounds present. No organomegaly or mass.  EXTREMITIES: No pedal edema, cyanosis, or clubbing.  NEUROLOGIC: Cranial nerves II  through XII are intact. Muscle strength 5/5 in all extremities. Sensation intact. Gait not checked.  PSYCHIATRIC: The patient is alert and oriented x 3.  SKIN: No obvious rash, lesion, or ulcer.  DATA REVIEW:    Management plans discussed with the patient, family and they are in agreement.  CODE STATUS: FULL CODE  TOTAL TIME TAKING CARE OF THIS PATIENT: 45 minutes.    [facility] HospitalHAH, Acelyn Basham M.D on 12/29/2015 at 7:08 AM  Between 7am to 6pm - Pager - [phone]  After 6pm go to [email or web address] - password [email or web address] [facility]  [facility]  Office  [phone]  CC: Primary care physician; Wonda ChengMOFFETT, JOEL, MD   Note: This dictation was prepared with Dragon dictation along with smaller phrase technology. Any transcriptional errors that result from this process are unintentional.

## 2015-12-31 ENCOUNTER — Emergency Department: Payer: Medicare Other

## 2015-12-31 ENCOUNTER — Emergency Department
Admission: EM | Admit: 2015-12-31 | Discharge: 2016-01-01 | Disposition: A | Discharge status: Home / Self Care | Payer: Medicare Other | Attending: Emergency Medicine | Admitting: Emergency Medicine

## 2015-12-31 ENCOUNTER — Encounter: Payer: Self-pay | Admitting: Emergency Medicine

## 2015-12-31 DIAGNOSIS — R410 Disorientation, unspecified: Secondary | ICD-10-CM | POA: Diagnosis not present

## 2015-12-31 DIAGNOSIS — Z7982 Long term (current) use of aspirin: Secondary | ICD-10-CM | POA: Insufficient documentation

## 2015-12-31 DIAGNOSIS — Z79899 Other long term (current) drug therapy: Secondary | ICD-10-CM | POA: Insufficient documentation

## 2015-12-31 DIAGNOSIS — R4182 Altered mental status, unspecified: Secondary | ICD-10-CM | POA: Diagnosis present

## 2015-12-31 DIAGNOSIS — I10 Essential (primary) hypertension: Secondary | ICD-10-CM | POA: Diagnosis not present

## 2015-12-31 DIAGNOSIS — Z7984 Long term (current) use of oral hypoglycemic drugs: Secondary | ICD-10-CM | POA: Diagnosis not present

## 2015-12-31 DIAGNOSIS — Z87891 Personal history of nicotine dependence: Secondary | ICD-10-CM | POA: Diagnosis not present

## 2015-12-31 DIAGNOSIS — E119 Type 2 diabetes mellitus without complications: Secondary | ICD-10-CM | POA: Diagnosis not present

## 2015-12-31 DIAGNOSIS — Z794 Long term (current) use of insulin: Secondary | ICD-10-CM | POA: Insufficient documentation

## 2015-12-31 LAB — COMPREHENSIVE METABOLIC PANEL
ALK PHOS: 80 U/L (ref 38–126)
ALT: 28 U/L (ref 14–54)
ANION GAP: 10 (ref 5–15)
AST: 24 U/L (ref 15–41)
Albumin: 3.6 g/dL (ref 3.5–5.0)
BUN: 14 mg/dL (ref 6–20)
CHLORIDE: 106 mmol/L (ref 101–111)
CO2: 19 mmol/L — AB (ref 22–32)
CREATININE: 0.73 mg/dL (ref 0.44–1.00)
Calcium: 9.6 mg/dL (ref 8.9–10.3)
GFR calc non Af Amer: >60 mL/min (ref >60)
Glucose, Bld: 153 mg/dL — ABNORMAL HIGH (ref 65–99)
POTASSIUM: 3.6 mmol/L (ref 3.5–5.1)
SODIUM: 135 mmol/L (ref 135–145)
Total Bilirubin: 0.8 mg/dL (ref 0.3–1.2)
Total Protein: 7.6 g/dL (ref 6.5–8.1)

## 2015-12-31 LAB — CBC WITH DIFFERENTIAL/PLATELET
BASOS ABS: 0 10*3/uL (ref 0–0.1)
BASOS PCT: 1 %
Eosinophils Absolute: 0.2 10*3/uL (ref 0–0.7)
Eosinophils Relative: 2 %
HEMATOCRIT: 43.7 % (ref 35.0–47.0)
HEMOGLOBIN: 14.6 g/dL (ref 12.0–16.0)
LYMPHS PCT: 27 %
Lymphs Abs: 2 10*3/uL (ref 1.0–3.6)
MCH: 27.4 pg (ref 26.0–34.0)
MCHC: 33.5 g/dL (ref 32.0–36.0)
MCV: 81.8 fL (ref 80.0–100.0)
MONO ABS: 0.6 10*3/uL (ref 0.2–0.9)
Monocytes Relative: 8 %
NEUTROS ABS: 4.8 10*3/uL (ref 1.4–6.5)
NEUTROS PCT: 62 %
Platelets: 237 10*3/uL (ref 150–440)
RBC: 5.35 MIL/uL — AB (ref 3.80–5.20)
RDW: 13.3 % (ref 11.5–14.5)
WBC: 7.7 10*3/uL (ref 3.6–11.0)

## 2015-12-31 LAB — URINALYSIS COMPLETE WITH MICROSCOPIC (ARMC ONLY)
Bilirubin Urine: NEGATIVE
Glucose, UA: 50 mg/dL — AB
KETONES UR: NEGATIVE mg/dL
LEUKOCYTES UA: NEGATIVE
NITRITE: NEGATIVE
PROTEIN: NEGATIVE mg/dL
RBC / HPF: NONE SEEN RBC/hpf (ref 0–5)
SPECIFIC GRAVITY, URINE: 1.004 — AB (ref 1.005–1.030)
WBC UA: NONE SEEN WBC/hpf (ref 0–5)
pH: 5 (ref 5.0–8.0)

## 2015-12-31 LAB — LIPASE, BLOOD: LIPASE: 66 U/L — AB (ref 11–51)

## 2015-12-31 LAB — GLUCOSE, CAPILLARY: Glucose-Capillary: 149 mg/dL — ABNORMAL HIGH (ref 65–99)

## 2015-12-31 LAB — AMMONIA: Ammonia: 23 umol/L (ref 9–35)

## 2015-12-31 LAB — ETHANOL

## 2015-12-31 NOTE — Discharge Instructions (Signed)
Confusion Confusion is the inability to think with your usual speed or clarity. Confusion may come on quickly or slowly over time. How quickly the confusion comes on depends on the cause. Confusion can be due to any number of causes. CAUSES   Concussion, head injury, or head trauma.  Seizures.  Stroke.  Fever.  Brain tumor.  Age related decreased brain function (dementia).  Heightened emotional states like rage or terror.  Mental illness in which the person loses the ability to determine what is real and what is not (hallucinations).  Infections such as a urinary tract infection (UTI).  Toxic effects from alcohol, drugs, or prescription medicines.  Dehydration and an imbalance of salts in the body (electrolytes).  Lack of sleep.  Low blood sugar (diabetes).  Low levels of oxygen from conditions such as chronic lung disorders.  Drug interactions or other medicine side effects.  Nutritional deficiencies, especially niacin, thiamine, vitamin C, or vitamin B.  Sudden drop in body temperature (hypothermia).  Change in routine, such as when traveling or hospitalized. SIGNS AND SYMPTOMS  People often describe their thinking as cloudy or unclear when they are confused. Confusion can also include feeling disoriented. That means you are unaware of where or who you are. You may also not know what the date or time is. If confused, you may also have difficulty paying attention, remembering, and making decisions. Some people also act aggressively when they are confused.  DIAGNOSIS  The medical evaluation of confusion may include:  Blood and urine tests.  X-rays.  Brain and nervous system tests.  Analyzing your brain waves (electroencephalogram or EEG).  Magnetic resonance imaging (MRI) of your head.  Computed tomography (CT) scan of your head.  Mental status tests in which your health care provider may ask many questions. Some of these questions may seem silly or strange,  but they are a very important test to help diagnose and treat confusion. TREATMENT  An admission to the hospital may not be needed, but a person with confusion should not be left alone. Stay with a family member or friend until the confusion clears. Avoid alcohol, pain relievers, or sedative drugs until you have fully recovered. Do not drive until directed by your health care provider. HOME CARE INSTRUCTIONS  What family and friends can do:  To find out if someone is confused, ask the person to state his or her name, age, and the date. If the person is unsure or answers incorrectly, he or she is confused.  Always introduce yourself, no matter how well the person knows you.  Often remind the person of his or her location.  Place a calendar and clock near the confused person.  Help the person with his or her medicines. You may want to use a pill box, an alarm as a reminder, or give the person each dose as prescribed.  Talk about current events and plans for the day.  Try to keep the environment calm, quiet, and peaceful.  Make sure the person keeps follow-up visits with his or her health care provider. PREVENTION  Ways to prevent confusion:  Avoid alcohol.  Eat a balanced diet.  Get enough sleep.  Take medicine only as directed by your health care provider.  Do not become isolated. Spend time with other people and make plans for your days.  Keep careful watch on your blood sugar levels if you are diabetic. SEEK IMMEDIATE MEDICAL CARE IF:   You develop severe headaches, repeated vomiting, seizures, blackouts, or   slurred speech.  There is increasing confusion, weakness, numbness, restlessness, or personality changes.  You develop a loss of balance, have marked dizziness, feel uncoordinated, or fall.  You have delusions, hallucinations, or develop severe anxiety.  Your family members think you need to be rechecked.   This information is not intended to replace advice given  to you by your health care provider. Make sure you discuss any questions you have with your health care provider.   Document Released: 10/17/2004 Document Revised: 09/30/2014 Document Reviewed: 10/15/2013 Elsevier Interactive Patient Education 2016 [employer]  

## 2015-12-31 NOTE — ED Provider Notes (Signed)
[facility] Emergency Department Provider Note  ____________________________________________  Time seen: 9:00 PM  I have reviewed the triage vital signs and the nursing notes.   HISTORY  Chief Complaint Altered Mental Status Level 5 caveat:  Portions of the history and physical were unable to be obtained due to the patient's acute illness and confusion.  Patient history is possibly unreliable  History obtained from daughter and son at bedside as well as EMS  HPI Emily [name] is a 70 y.o. female who recently suffered multiple ischemic strokes within the last week or 2. She was hospitalized, underwent a full workup including TEE, was discharged home with outpatient follow-up with cardiology and neurology primary care and home health evaluations. Over the past week, things have been going well and the patient is been tolerating her medications as well as eating and drinking normally. However, since last night, the son who is living with the patient has noticed several episodes where the patient seems to be somewhat confused or almost lost. She will sit in one place and not move and not interact for up to an hour. She is still eating and drinking and maintaining her executive functioning including making her bed and toileting by herself. She is managing all her ADLs. They're concerned that she may have some worsening sequelae. No vomiting. No falls or new trauma.   Patient denies any symptoms. She states she feels well and at her baseline.  Past Medical History  Diagnosis Date  . Essential hypertension, benign   . Type II or unspecified type diabetes mellitus without mention of complication, uncontrolled   . Gout, unspecified   . Mixed hyperlipidemia   . Syncope and collapse      Patient Active Problem List   Diagnosis Date Noted  . Acute CVA (cerebrovascular accident) (HCC) 12/22/2015  . Syncope 12/21/2015  . Hyperlipidemia 06/23/2013  . DM (diabetes  mellitus) (HCC) 06/23/2013  . HTN (hypertension) 06/23/2013  . Morbid obesity (HCC) 06/23/2013  . Abnormal ECG 06/23/2013     Past Surgical History  Procedure Laterality Date  . Abdominal hysterectomy    . Tonsillectomy and adenoidectomy    . Tee without cardioversion N/A 12/26/2015    Procedure: TRANSESOPHAGEAL ECHOCARDIOGRAM (TEE);  Surgeon: Dalia Heading, MD;  Location: [facility] ORS;  Service: Cardiovascular;  Laterality: N/A;     Current Outpatient Rx  Name  Route  Sig  Dispense  Refill  . aspirin EC 325 MG tablet   Oral   Take 1 tablet (325 mg total) by mouth daily.   30 tablet   0   . atorvastatin (LIPITOR) 40 MG tablet   Oral   Take 1 tablet (40 mg total) by mouth daily at 6 PM.   30 tablet   2   . clopidogrel (PLAVIX) 75 MG tablet   Oral   Take 1 tablet (75 mg total) by mouth daily.   30 tablet   2   . diltiazem (CARDIZEM SR) 90 MG 12 hr capsule   Oral   Take 90 mg by mouth 2 (two) times daily.         . insulin glargine (LANTUS) 100 UNIT/ML injection   Subcutaneous   Inject 0.12 mLs (12 Units total) into the skin at bedtime.   10 mL   11   . metFORMIN (GLUCOPHAGE) 1000 MG tablet   Oral   Take 1 tablet (1,000 mg total) by mouth 2 (two) times daily with a meal.   60  tablet   0   . polyethylene glycol (MIRALAX / GLYCOLAX) packet   Oral   Take 17 g by mouth daily.   14 each   0      Allergies Enalapril   Family History  Problem Relation Age of Onset  . Heart disease Mother   . Heart attack Mother   . Hypertension Mother   . Heart disease Father   . Heart attack Father   . Hypertension Father   . Heart disease Brother   . Stroke Brother   . Heart attack Other     Social History Social History  Substance Use Topics  . Smoking status: Former Games developermoker  . Smokeless tobacco: None  . Alcohol Use: Yes     Comment: socially    Review of Systems  Constitutional:   No fever or chills.  Eyes:   No vision changes.  ENT:   No sore throat.  No rhinorrhea. Cardiovascular:   No chest pain. Respiratory:   No dyspnea or cough. Gastrointestinal:   Negative for abdominal pain, vomiting and diarrhea.  No bloody stool. Genitourinary:   Negative for dysuria or difficulty urinating. Musculoskeletal:   Negative for focal pain or swelling Neurological:   Negative for headaches 10-point ROS otherwise negative.  ____________________________________________   PHYSICAL EXAM:  VITAL SIGNS: ED Triage Vitals  Enc Vitals Group     BP 12/31/15 2102 175/79 mmHg     Pulse Rate 12/31/15 2102 72     Resp 12/31/15 2102 20     Temp 12/31/15 2102 98.5 F (36.9 C)     Temp Source 12/31/15 2102 Oral     SpO2 12/31/15 2102 97 %     Weight 12/31/15 2102 228 lb (103.42 kg)     Height 12/31/15 2102 5\' 5"  (1.651 m)     Head Cir --      Peak Flow --      Pain Score 12/31/15 2257 0     Pain Loc --      Pain Edu? --      Excl. in GC? --     Vital signs reviewed, nursing assessments reviewed.   Constitutional:   Alert. Well appearing and in no distress.Calm and comfortable Eyes:   No scleral icterus. No conjunctival pallor. PERRL. EOMI ENT   Head:   Normocephalic and atraumatic.   Nose:   No congestion/rhinnorhea. No septal hematoma   Mouth/Throat:   MMM, no pharyngeal erythema. No peritonsillar mass.    Neck:   No stridor. No SubQ emphysema. No meningismus. Hematological/Lymphatic/Immunilogical:   No cervical lymphadenopathy. Cardiovascular:   RRR. Symmetric bilateral radial and DP pulses.  No murmurs.  Respiratory:   Normal respiratory effort without tachypnea nor retractions. Breath sounds are clear and equal bilaterally. No wheezes/rales/rhonchi. Gastrointestinal:   Soft and nontender. Non distended. There is no CVA tenderness.  No rebound, rigidity, or guarding. Genitourinary:   deferred Musculoskeletal:   Nontender with normal range of motion in all extremities. No joint effusions.  No lower extremity tenderness.  No  edema. Neurologic:   Normal speech and language.  CN 2-10 normal. Motor grossly intact. No gross focal neurologic deficits are appreciated.  Skin:    Skin is warm, dry and intact. No rash noted.  No petechiae, purpura, or bullae.  ____________________________________________    LABS (pertinent positives/negatives) (all labs ordered are listed, but only abnormal results are displayed) Labs Reviewed  COMPREHENSIVE METABOLIC PANEL - Abnormal; Notable for the following:  CO2 19 (*)    Glucose, Bld 153 (*)    All other components within normal limits  LIPASE, BLOOD - Abnormal; Notable for the following:    Lipase 66 (*)    All other components within normal limits  CBC WITH DIFFERENTIAL/PLATELET - Abnormal; Notable for the following:    RBC 5.35 (*)    All other components within normal limits  URINALYSIS COMPLETEWITH MICROSCOPIC ([facility] ONLY) - Abnormal; Notable for the following:    Color, Urine STRAW (*)    APPearance CLEAR (*)    Glucose, UA 50 (*)    Specific Gravity, Urine 1.004 (*)    Hgb urine dipstick 1+ (*)    Bacteria, UA RARE (*)    Squamous Epithelial / LPF 0-5 (*)    All other components within normal limits  GLUCOSE, CAPILLARY - Abnormal; Notable for the following:    Glucose-Capillary 149 (*)    All other components within normal limits  ETHANOL  AMMONIA   ____________________________________________   EKG  Interpreted by me Sinus rhythm rate of 77, left axis, normal intervals. Poor R-wave progression in anterior precordial leads. Normal ST segments and T waves.  ____________________________________________    RADIOLOGY  CT head shows chronic infarcts, no acute changes.  ____________________________________________   PROCEDURES   ____________________________________________   INITIAL IMPRESSION / ASSESSMENT AND PLAN / ED COURSE  Pertinent labs & imaging results that were available during my care of the patient were reviewed by me and  considered in my medical decision making (see chart for details).  Patient presents with brief episodes of confusion, multiple today. Currently resolved and back to baseline. Her executive function appears to be preserved and neurologically she is currently intact. Workup is completely unremarkable. No evidence of UTI even. This appears to actually be the natural course and expected changes in a post stroke timeframe, where due to scarring inflammatory changes remodeling in the healing process, I think she is going to experience intermittent episodes of confusion or altered mentation. However there are many reassuring factors including that she is still able to perform complex tasks on her home, taking her medications, eating and drinking, and not following or appearing to have any new deficits. She has extensive resources arranged for her and follow-up with multiple specialties. I think she is medically stable to continue outpatient management. She has undergone evaluation for risk factor modification and started aspirin in addition to Plavix and take control of her other medical problems. Discharge her home with family to follow up with primary care tomorrow.     ____________________________________________   FINAL CLINICAL IMPRESSION(S) / ED DIAGNOSES  Final diagnoses:  Intermittent confusion       Portions of this note were generated with dragon dictation software. Dictation errors may occur despite best attempts at proofreading.   Sharman Cheek, MD 12/31/15 2340

## 2015-12-31 NOTE — ED Notes (Signed)
Pt presents to ED with c/o confusion that started last night; from home.

## 2016-01-01 NOTE — ED Notes (Signed)
Patient with no complaints at this time. Respirations even and unlabored. Skin warm/dry. Discharge instructions reviewed with patient at this time. Patient given opportunity to voice concerns/ask questions. IV removed per policy and band-aid applied to site. Patient discharged at this time and left Emergency Department, via wheelchair.   

## 2016-01-02 DIAGNOSIS — Z7189 Other specified counseling: Secondary | ICD-10-CM | POA: Insufficient documentation

## 2016-01-02 DIAGNOSIS — Z9181 History of falling: Secondary | ICD-10-CM | POA: Insufficient documentation

## 2016-01-02 DIAGNOSIS — J019 Acute sinusitis, unspecified: Secondary | ICD-10-CM | POA: Insufficient documentation

## 2016-01-02 DIAGNOSIS — M25579 Pain in unspecified ankle and joints of unspecified foot: Secondary | ICD-10-CM | POA: Insufficient documentation

## 2016-01-02 DIAGNOSIS — R350 Frequency of micturition: Secondary | ICD-10-CM | POA: Insufficient documentation

## 2016-01-02 DIAGNOSIS — R5381 Other malaise: Secondary | ICD-10-CM | POA: Insufficient documentation

## 2016-01-04 ENCOUNTER — Encounter: Admission: RE | Admit: 2016-01-04 | Payer: Medicare Other | Source: Ambulatory Visit | Admitting: Internal Medicine

## 2016-01-04 ENCOUNTER — Encounter
Admission: RE | Admit: 2016-01-04 | Discharge: 2016-01-04 | Disposition: A | Discharge status: Home / Self Care | Payer: Medicare Other | Source: Ambulatory Visit | Attending: Internal Medicine | Admitting: Internal Medicine

## 2016-01-04 LAB — GLUCOSE, CAPILLARY
Glucose-Capillary: 170 mg/dL — ABNORMAL HIGH (ref 65–99)
Glucose-Capillary: 163 mg/dL — ABNORMAL HIGH (ref 65–99)

## 2016-01-05 LAB — GLUCOSE, CAPILLARY
GLUCOSE-CAPILLARY: 176 mg/dL — AB (ref 65–99)
GLUCOSE-CAPILLARY: 119 mg/dL — AB (ref 65–99)
Glucose-Capillary: 148 mg/dL — ABNORMAL HIGH (ref 65–99)
Glucose-Capillary: 172 mg/dL — ABNORMAL HIGH (ref 65–99)

## 2016-01-06 LAB — GLUCOSE, CAPILLARY
GLUCOSE-CAPILLARY: 148 mg/dL — AB (ref 65–99)
GLUCOSE-CAPILLARY: 150 mg/dL — AB (ref 65–99)
GLUCOSE-CAPILLARY: 179 mg/dL — AB (ref 65–99)
Glucose-Capillary: 148 mg/dL — ABNORMAL HIGH (ref 65–99)

## 2016-01-07 LAB — GLUCOSE, CAPILLARY
GLUCOSE-CAPILLARY: 316 mg/dL — AB (ref 65–99)
GLUCOSE-CAPILLARY: 191 mg/dL — AB (ref 65–99)
GLUCOSE-CAPILLARY: 155 mg/dL — AB (ref 65–99)
GLUCOSE-CAPILLARY: 149 mg/dL — AB (ref 65–99)
Glucose-Capillary: 136 mg/dL — ABNORMAL HIGH (ref 65–99)

## 2016-01-08 LAB — GLUCOSE, CAPILLARY
GLUCOSE-CAPILLARY: 135 mg/dL — AB (ref 65–99)
GLUCOSE-CAPILLARY: 190 mg/dL — AB (ref 65–99)
GLUCOSE-CAPILLARY: 146 mg/dL — AB (ref 65–99)

## 2016-01-09 LAB — GLUCOSE, CAPILLARY
Glucose-Capillary: 120 mg/dL — ABNORMAL HIGH (ref 65–99)
Glucose-Capillary: 195 mg/dL — ABNORMAL HIGH (ref 65–99)
Glucose-Capillary: 121 mg/dL — ABNORMAL HIGH (ref 65–99)

## 2016-01-10 LAB — GLUCOSE, CAPILLARY
GLUCOSE-CAPILLARY: 157 mg/dL — AB (ref 65–99)
Glucose-Capillary: 145 mg/dL — ABNORMAL HIGH (ref 65–99)
Glucose-Capillary: 173 mg/dL — ABNORMAL HIGH (ref 65–99)
Glucose-Capillary: 140 mg/dL — ABNORMAL HIGH (ref 65–99)
Glucose-Capillary: 189 mg/dL — ABNORMAL HIGH (ref 65–99)

## 2016-01-11 LAB — GLUCOSE, CAPILLARY
Glucose-Capillary: 113 mg/dL — ABNORMAL HIGH (ref 65–99)
Glucose-Capillary: 134 mg/dL — ABNORMAL HIGH (ref 65–99)
Glucose-Capillary: 107 mg/dL — ABNORMAL HIGH (ref 65–99)
Glucose-Capillary: 166 mg/dL — ABNORMAL HIGH (ref 65–99)

## 2016-01-13 LAB — GLUCOSE, CAPILLARY
GLUCOSE-CAPILLARY: 117 mg/dL — AB (ref 65–99)
GLUCOSE-CAPILLARY: 103 mg/dL — AB (ref 65–99)
Glucose-Capillary: 181 mg/dL — ABNORMAL HIGH (ref 65–99)
Glucose-Capillary: 118 mg/dL — ABNORMAL HIGH (ref 65–99)
Glucose-Capillary: 195 mg/dL — ABNORMAL HIGH (ref 65–99)
Glucose-Capillary: 133 mg/dL — ABNORMAL HIGH (ref 65–99)
Glucose-Capillary: 113 mg/dL — ABNORMAL HIGH (ref 65–99)
Glucose-Capillary: 168 mg/dL — ABNORMAL HIGH (ref 65–99)

## 2016-01-14 LAB — GLUCOSE, CAPILLARY
GLUCOSE-CAPILLARY: 121 mg/dL — AB (ref 65–99)
GLUCOSE-CAPILLARY: 113 mg/dL — AB (ref 65–99)
Glucose-Capillary: 82 mg/dL (ref 65–99)
Glucose-Capillary: 176 mg/dL — ABNORMAL HIGH (ref 65–99)

## 2016-01-15 LAB — GLUCOSE, CAPILLARY
GLUCOSE-CAPILLARY: 101 mg/dL — AB (ref 65–99)
GLUCOSE-CAPILLARY: 120 mg/dL — AB (ref 65–99)
GLUCOSE-CAPILLARY: 164 mg/dL — AB (ref 65–99)
Glucose-Capillary: 86 mg/dL (ref 65–99)

## 2016-01-17 LAB — GLUCOSE, CAPILLARY
GLUCOSE-CAPILLARY: 120 mg/dL — AB (ref 65–99)
GLUCOSE-CAPILLARY: 160 mg/dL — AB (ref 65–99)
GLUCOSE-CAPILLARY: 113 mg/dL — AB (ref 65–99)
GLUCOSE-CAPILLARY: 142 mg/dL — AB (ref 65–99)
GLUCOSE-CAPILLARY: 91 mg/dL (ref 65–99)
Glucose-Capillary: 113 mg/dL — ABNORMAL HIGH (ref 65–99)
Glucose-Capillary: 148 mg/dL — ABNORMAL HIGH (ref 65–99)
Glucose-Capillary: 131 mg/dL — ABNORMAL HIGH (ref 65–99)

## 2016-01-18 LAB — GLUCOSE, CAPILLARY
Glucose-Capillary: 116 mg/dL — ABNORMAL HIGH (ref 65–99)
Glucose-Capillary: 158 mg/dL — ABNORMAL HIGH (ref 65–99)
Glucose-Capillary: 104 mg/dL — ABNORMAL HIGH (ref 65–99)
Glucose-Capillary: 146 mg/dL — ABNORMAL HIGH (ref 65–99)

## 2016-01-19 LAB — GLUCOSE, CAPILLARY
GLUCOSE-CAPILLARY: 123 mg/dL — AB (ref 65–99)
GLUCOSE-CAPILLARY: 100 mg/dL — AB (ref 65–99)
GLUCOSE-CAPILLARY: 163 mg/dL — AB (ref 65–99)
Glucose-Capillary: 91 mg/dL (ref 65–99)

## 2016-01-20 LAB — GLUCOSE, CAPILLARY
GLUCOSE-CAPILLARY: 98 mg/dL (ref 65–99)
GLUCOSE-CAPILLARY: 122 mg/dL — AB (ref 65–99)
Glucose-Capillary: 95 mg/dL (ref 65–99)
Glucose-Capillary: 144 mg/dL — ABNORMAL HIGH (ref 65–99)

## 2016-01-21 LAB — GLUCOSE, CAPILLARY
GLUCOSE-CAPILLARY: 82 mg/dL (ref 65–99)
GLUCOSE-CAPILLARY: 113 mg/dL — AB (ref 65–99)

## 2016-01-22 ENCOUNTER — Encounter
Admission: RE | Admit: 2016-01-22 | Discharge: 2016-01-22 | Disposition: A | Discharge status: Home / Self Care | Payer: Medicare Other | Source: Ambulatory Visit | Attending: Internal Medicine | Admitting: Internal Medicine

## 2016-01-22 LAB — GLUCOSE, CAPILLARY: Glucose-Capillary: 108 mg/dL — ABNORMAL HIGH (ref 65–99)

## 2016-01-23 DIAGNOSIS — I69320 Aphasia following cerebral infarction: Secondary | ICD-10-CM | POA: Insufficient documentation

## 2016-01-23 DIAGNOSIS — R404 Transient alteration of awareness: Secondary | ICD-10-CM | POA: Insufficient documentation

## 2016-01-23 LAB — GLUCOSE, CAPILLARY
GLUCOSE-CAPILLARY: 125 mg/dL — AB (ref 65–99)
GLUCOSE-CAPILLARY: 141 mg/dL — AB (ref 65–99)
GLUCOSE-CAPILLARY: 94 mg/dL (ref 65–99)

## 2016-01-24 LAB — GLUCOSE, CAPILLARY
GLUCOSE-CAPILLARY: 83 mg/dL (ref 65–99)
GLUCOSE-CAPILLARY: 173 mg/dL — AB (ref 65–99)
Glucose-Capillary: 73 mg/dL (ref 65–99)
Glucose-Capillary: 111 mg/dL — ABNORMAL HIGH (ref 65–99)
Glucose-Capillary: 93 mg/dL (ref 65–99)
Glucose-Capillary: 121 mg/dL — ABNORMAL HIGH (ref 65–99)

## 2016-01-25 LAB — GLUCOSE, CAPILLARY
GLUCOSE-CAPILLARY: 171 mg/dL — AB (ref 65–99)
GLUCOSE-CAPILLARY: 123 mg/dL — AB (ref 65–99)
Glucose-Capillary: 100 mg/dL — ABNORMAL HIGH (ref 65–99)
Glucose-Capillary: 175 mg/dL — ABNORMAL HIGH (ref 65–99)

## 2016-01-26 LAB — GLUCOSE, CAPILLARY
GLUCOSE-CAPILLARY: 166 mg/dL — AB (ref 65–99)
Glucose-Capillary: 96 mg/dL (ref 65–99)
Glucose-Capillary: 93 mg/dL (ref 65–99)
Glucose-Capillary: 128 mg/dL — ABNORMAL HIGH (ref 65–99)

## 2016-01-27 LAB — GLUCOSE, CAPILLARY
GLUCOSE-CAPILLARY: 86 mg/dL (ref 65–99)
GLUCOSE-CAPILLARY: 130 mg/dL — AB (ref 65–99)
Glucose-Capillary: 91 mg/dL (ref 65–99)
Glucose-Capillary: 129 mg/dL — ABNORMAL HIGH (ref 65–99)

## 2016-01-28 LAB — GLUCOSE, CAPILLARY
GLUCOSE-CAPILLARY: 82 mg/dL (ref 65–99)
Glucose-Capillary: 103 mg/dL — ABNORMAL HIGH (ref 65–99)
Glucose-Capillary: 79 mg/dL (ref 65–99)
Glucose-Capillary: 124 mg/dL — ABNORMAL HIGH (ref 65–99)

## 2016-01-29 LAB — GLUCOSE, CAPILLARY
GLUCOSE-CAPILLARY: 96 mg/dL (ref 65–99)
GLUCOSE-CAPILLARY: 81 mg/dL (ref 65–99)
GLUCOSE-CAPILLARY: 74 mg/dL (ref 65–99)
GLUCOSE-CAPILLARY: 127 mg/dL — AB (ref 65–99)

## 2016-01-30 LAB — GLUCOSE, CAPILLARY
GLUCOSE-CAPILLARY: 74 mg/dL (ref 65–99)
GLUCOSE-CAPILLARY: 108 mg/dL — AB (ref 65–99)
GLUCOSE-CAPILLARY: 137 mg/dL — AB (ref 65–99)
Glucose-Capillary: 144 mg/dL — ABNORMAL HIGH (ref 65–99)
Glucose-Capillary: 80 mg/dL (ref 65–99)

## 2016-01-31 LAB — GLUCOSE, CAPILLARY
GLUCOSE-CAPILLARY: 164 mg/dL — AB (ref 65–99)
Glucose-Capillary: 91 mg/dL (ref 65–99)
Glucose-Capillary: 106 mg/dL — ABNORMAL HIGH (ref 65–99)
Glucose-Capillary: 123 mg/dL — ABNORMAL HIGH (ref 65–99)

## 2016-02-01 LAB — GLUCOSE, CAPILLARY
GLUCOSE-CAPILLARY: 106 mg/dL — AB (ref 65–99)
GLUCOSE-CAPILLARY: 78 mg/dL (ref 65–99)

## 2016-02-02 LAB — GLUCOSE, CAPILLARY
GLUCOSE-CAPILLARY: 89 mg/dL (ref 65–99)
Glucose-Capillary: 90 mg/dL (ref 65–99)
Glucose-Capillary: 120 mg/dL — ABNORMAL HIGH (ref 65–99)
Glucose-Capillary: 84 mg/dL (ref 65–99)
Glucose-Capillary: 90 mg/dL (ref 65–99)
Glucose-Capillary: 168 mg/dL — ABNORMAL HIGH (ref 65–99)

## 2016-02-03 LAB — GLUCOSE, CAPILLARY
GLUCOSE-CAPILLARY: 91 mg/dL (ref 65–99)
GLUCOSE-CAPILLARY: 188 mg/dL — AB (ref 65–99)
Glucose-Capillary: 67 mg/dL (ref 65–99)
Glucose-Capillary: 88 mg/dL (ref 65–99)
Glucose-Capillary: 131 mg/dL — ABNORMAL HIGH (ref 65–99)

## 2016-02-08 LAB — URINALYSIS COMPLETE WITH MICROSCOPIC (ARMC ONLY)
BACTERIA UA: NONE SEEN
BILIRUBIN URINE: NEGATIVE
Glucose, UA: NEGATIVE mg/dL
HGB URINE DIPSTICK: NEGATIVE
Ketones, ur: NEGATIVE mg/dL
LEUKOCYTES UA: NEGATIVE
Nitrite: NEGATIVE
PH: 5 (ref 5.0–8.0)
PROTEIN: NEGATIVE mg/dL
Specific Gravity, Urine: 1.006 (ref 1.005–1.030)

## 2016-02-09 LAB — URINE CULTURE

## 2016-02-10 LAB — URINALYSIS COMPLETE WITH MICROSCOPIC (ARMC ONLY)
BILIRUBIN URINE: NEGATIVE
GLUCOSE, UA: NEGATIVE mg/dL
KETONES UR: NEGATIVE mg/dL
Nitrite: POSITIVE — AB
PH: 6 (ref 5.0–8.0)
Protein, ur: NEGATIVE mg/dL
Specific Gravity, Urine: 1.005 (ref 1.005–1.030)

## 2016-02-12 LAB — URINE CULTURE: Culture: >=100000 — AB

## 2016-04-11 ENCOUNTER — Encounter: Payer: Self-pay | Admitting: Emergency Medicine

## 2016-04-11 ENCOUNTER — Emergency Department: Payer: Medicare Other

## 2016-04-11 ENCOUNTER — Emergency Department
Admission: EM | Admit: 2016-04-11 | Discharge: 2016-04-11 | Disposition: A | Discharge status: Home / Self Care | Payer: Medicare Other | Attending: Emergency Medicine | Admitting: Emergency Medicine

## 2016-04-11 DIAGNOSIS — Z7982 Long term (current) use of aspirin: Secondary | ICD-10-CM | POA: Diagnosis not present

## 2016-04-11 DIAGNOSIS — R55 Syncope and collapse: Secondary | ICD-10-CM | POA: Diagnosis not present

## 2016-04-11 DIAGNOSIS — Z8673 Personal history of transient ischemic attack (TIA), and cerebral infarction without residual deficits: Secondary | ICD-10-CM | POA: Insufficient documentation

## 2016-04-11 DIAGNOSIS — Z7984 Long term (current) use of oral hypoglycemic drugs: Secondary | ICD-10-CM | POA: Diagnosis not present

## 2016-04-11 DIAGNOSIS — Z79899 Other long term (current) drug therapy: Secondary | ICD-10-CM | POA: Insufficient documentation

## 2016-04-11 DIAGNOSIS — Z794 Long term (current) use of insulin: Secondary | ICD-10-CM | POA: Insufficient documentation

## 2016-04-11 DIAGNOSIS — I10 Essential (primary) hypertension: Secondary | ICD-10-CM | POA: Diagnosis not present

## 2016-04-11 DIAGNOSIS — E119 Type 2 diabetes mellitus without complications: Secondary | ICD-10-CM | POA: Insufficient documentation

## 2016-04-11 DIAGNOSIS — Z87891 Personal history of nicotine dependence: Secondary | ICD-10-CM | POA: Insufficient documentation

## 2016-04-11 DIAGNOSIS — E782 Mixed hyperlipidemia: Secondary | ICD-10-CM | POA: Diagnosis not present

## 2016-04-11 HISTORY — DX: Cerebral infarction, unspecified: I63.9

## 2016-04-11 LAB — COMPREHENSIVE METABOLIC PANEL
ALT: 13 U/L — AB (ref 14–54)
AST: 21 U/L (ref 15–41)
Albumin: 3.7 g/dL (ref 3.5–5.0)
Alkaline Phosphatase: 73 U/L (ref 38–126)
Anion gap: 9 (ref 5–15)
BUN: 21 mg/dL — AB (ref 6–20)
CHLORIDE: 108 mmol/L (ref 101–111)
CO2: 24 mmol/L (ref 22–32)
CREATININE: 0.85 mg/dL (ref 0.44–1.00)
Calcium: 9.2 mg/dL (ref 8.9–10.3)
GFR calc Af Amer: >60 mL/min (ref >60)
GLUCOSE: 95 mg/dL (ref 65–99)
Potassium: 3.5 mmol/L (ref 3.5–5.1)
Sodium: 141 mmol/L (ref 135–145)
Total Bilirubin: 1 mg/dL (ref 0.3–1.2)
Total Protein: 7.2 g/dL (ref 6.5–8.1)

## 2016-04-11 LAB — CBC WITH DIFFERENTIAL/PLATELET
Basophils Absolute: 0 10*3/uL (ref 0–0.1)
Basophils Relative: 1 %
Eosinophils Absolute: 0.1 10*3/uL (ref 0–0.7)
Eosinophils Relative: 2 %
HEMATOCRIT: 38.6 % (ref 35.0–47.0)
HEMOGLOBIN: 13.4 g/dL (ref 12.0–16.0)
LYMPHS ABS: 1.6 10*3/uL (ref 1.0–3.6)
LYMPHS PCT: 28 %
MCH: 29.1 pg (ref 26.0–34.0)
MCHC: 34.7 g/dL (ref 32.0–36.0)
MCV: 83.7 fL (ref 80.0–100.0)
Monocytes Absolute: 0.4 10*3/uL (ref 0.2–0.9)
Monocytes Relative: 7 %
NEUTROS PCT: 62 %
Neutro Abs: 3.7 10*3/uL (ref 1.4–6.5)
PLATELETS: 227 10*3/uL (ref 150–440)
RBC: 4.61 MIL/uL (ref 3.80–5.20)
RDW: 14.1 % (ref 11.5–14.5)
WBC: 5.8 10*3/uL (ref 3.6–11.0)

## 2016-04-11 LAB — TROPONIN I

## 2016-04-11 NOTE — Discharge Instructions (Signed)
Syncope °Syncope is a medical term for fainting or passing out. This means you lose consciousness and drop to the ground. People are generally unconscious for less than 5 minutes. You may have some muscle twitches for up to 15 seconds before waking up and returning to normal. Syncope occurs more often in older adults, but it can happen to anyone. While most causes of syncope are not dangerous, syncope can be a sign of a serious medical problem. It is important to seek medical care.  °CAUSES  °Syncope is caused by a sudden drop in blood flow to the brain. The specific cause is often not determined. Factors that can bring on syncope include: °· Taking medicines that lower blood pressure. °· Sudden changes in posture, such as standing up quickly. °· Taking more medicine than prescribed. °· Standing in one place for too long. °· Seizure disorders. °· Dehydration and excessive exposure to heat. °· Low blood sugar (hypoglycemia). °· Straining to have a bowel movement. °· Heart disease, irregular heartbeat, or other circulatory problems. °· Fear, emotional distress, seeing blood, or severe pain. °SYMPTOMS  °Right before fainting, you may: °· Feel dizzy or light-headed. °· Feel nauseous. °· See all white or all black in your field of vision. °· Have cold, clammy skin. °DIAGNOSIS  °Your health care provider will ask about your symptoms, perform a physical exam, and perform an electrocardiogram (ECG) to record the electrical activity of your heart. Your health care provider may also perform other heart or blood tests to determine the cause of your syncope which may include: °· Transthoracic echocardiogram (TTE). During echocardiography, sound waves are used to evaluate how blood flows through your heart. °· Transesophageal echocardiogram (TEE). °· Cardiac monitoring. This allows your health care provider to monitor your heart rate and rhythm in real time. °· Holter monitor. This is a portable device that records your  heartbeat and can help diagnose heart arrhythmias. It allows your health care provider to track your heart activity for several days, if needed. °· Stress tests by exercise or by giving medicine that makes the heart beat faster. °TREATMENT  °In most cases, no treatment is needed. Depending on the cause of your syncope, your health care provider may recommend changing or stopping some of your medicines. °HOME CARE INSTRUCTIONS °· Have someone stay with you until you feel stable. °· Do not drive, use machinery, or play sports until your health care provider says it is okay. °· Keep all follow-up appointments as directed by your health care provider. °· Lie down right away if you start feeling like you might faint. Breathe deeply and steadily. Wait until all the symptoms have passed. °· Drink enough fluids to keep your urine clear or pale yellow. °· If you are taking blood pressure or heart medicine, get up slowly and take several minutes to sit and then stand. This can reduce dizziness. °SEEK IMMEDIATE MEDICAL CARE IF:  °· You have a severe headache. °· You have unusual pain in the chest, abdomen, or back. °· You are bleeding from your mouth or rectum, or you have black or tarry stool. °· You have an irregular or very fast heartbeat. °· You have pain with breathing. °· You have repeated fainting or seizure-like jerking during an episode. °· You faint when sitting or lying down. °· You have confusion. °· You have trouble walking. °· You have severe weakness. °· You have vision problems. °If you fainted, call your local emergency services (911 in [place] Do not drive   yourself to the hospital.  °  °This information is not intended to replace advice given to you by your health care provider. Make sure you discuss any questions you have with your health care provider. °  °Document Released: 09/09/2005 Document Revised: 01/24/2015 Document Reviewed: 11/08/2011 °Elsevier Interactive Patient Education ©2016 Elsevier  Inc. ° °Please return immediately if condition worsens. Please contact her primary physician or the physician you were given for referral. If you have any specialist physicians involved in her treatment and plan please also contact them. Thank you for using [facility]. ° °

## 2016-04-11 NOTE — ED Provider Notes (Signed)
Time Seen: Approximately *1040 I have reviewed the triage notes  Chief Complaint: Loss of Consciousness   History of Present Illness: Emily LoHelen H Shipman is a 70 y.o. 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 Patient states she felt lightheaded. She sat down and had what sounds like a very brief syncopal episode. The patient may have had some upper extremity jerking according to the caretaker. The patient at this time. Fine and denies any chest pain or shortness of breath. She has had previous history of syncopal episode in the past and also recent cerebrovascular accident in March 2017. She is not aware of any residual effects from her stroke. Patient also has history of hypertension and diabetes.   Past Medical History  Diagnosis Date  . Essential hypertension, benign   . Type II or unspecified type diabetes mellitus without mention of complication, uncontrolled   . Gout, unspecified   . Mixed hyperlipidemia   . Syncope and collapse   . Stroke [facility](HCC)     march 2017    Patient Active Problem List   Diagnosis Date Noted  . Acute CVA (cerebrovascular accident) (HCC) 12/22/2015  . Syncope 12/21/2015  . Hyperlipidemia 06/23/2013  . DM (diabetes mellitus) (HCC) 06/23/2013  . HTN (hypertension) 06/23/2013  . Morbid obesity (HCC) 06/23/2013  . Abnormal ECG 06/23/2013    Past Surgical History  Procedure Laterality Date  . Abdominal hysterectomy    . Tonsillectomy and adenoidectomy    . Tee without cardioversion N/A 12/26/2015    Procedure: TRANSESOPHAGEAL ECHOCARDIOGRAM (TEE);  Surgeon: Dalia HeadingKenneth A Fath, MD;  Location: [facility] ORS;  Service: Cardiovascular;  Laterality: N/A;    Past Surgical History  Procedure Laterality Date  . Abdominal hysterectomy    . Tonsillectomy and adenoidectomy    . Tee without cardioversion N/A 12/26/2015    Procedure: TRANSESOPHAGEAL ECHOCARDIOGRAM (TEE);  Surgeon: Dalia HeadingKenneth A Fath, MD;  Location: [facility] ORS;   Service: Cardiovascular;  Laterality: N/A;    Current Outpatient Rx  Name  Route  Sig  Dispense  Refill  . aspirin EC 81 MG tablet   Oral   Take 81 mg by mouth daily.         Marland Kitchen. atorvastatin (LIPITOR) 40 MG tablet   Oral   Take 1 tablet (40 mg total) by mouth daily at 6 PM. Patient taking differently: Take 40 mg by mouth every morning.    30 tablet   2   . clopidogrel (PLAVIX) 75 MG tablet   Oral   Take 1 tablet (75 mg total) by mouth daily.   30 tablet   2   . diltiazem (CARDIZEM) 90 MG tablet   Oral   Take 90 mg by mouth 2 (two) times daily.         . insulin glargine (LANTUS) 100 UNIT/ML injection   Subcutaneous   Inject 0.12 mLs (12 Units total) into the skin at bedtime.   10 mL   11   . metFORMIN (GLUCOPHAGE) 1000 MG tablet   Oral   Take 1 tablet (1,000 mg total) by mouth 2 (two) times daily with a meal.   60 tablet   0   . polyethylene glycol (MIRALAX / GLYCOLAX) packet   Oral   Take 17 g by mouth daily. Patient taking differently: Take 17 g by mouth daily as needed for moderate constipation.    14 each   0   . aspirin EC 325  MG tablet   Oral   Take 1 tablet (325 mg total) by mouth daily. Patient not taking: Reported on 04/11/2016   30 tablet   0     Allergies:  Enalapril and Prednisone  Family History: Family History  Problem Relation Age of Onset  . Heart disease Mother   . Heart attack Mother   . Hypertension Mother   . Heart disease Father   . Heart attack Father   . Hypertension Father   . Heart disease Brother   . Stroke Brother   . Heart attack Other     Social History: Social History  Substance Use Topics  . Smoking status: Former Games developer  . Smokeless tobacco: None  . Alcohol Use: Yes     Comment: socially     Review of Systems:   10 point review of systems was performed and was otherwise negative:  Constitutional: No fever Eyes: No visual disturbances ENT: No sore throat, ear pain Cardiac: No chest  pain Respiratory: No shortness of breath, wheezing, or stridor Abdomen: No abdominal pain, no vomiting, No diarrhea Endocrine: No weight loss, No night sweats Extremities: No peripheral edema, cyanosis Skin: No rashes, easy bruising Neurologic: No focal weakness, trouble with speech or swollowing Urologic: No dysuria, Hematuria, or urinary frequency   Physical Exam:  ED Triage Vitals  Enc Vitals Group     BP 04/11/16 0944 137/78 mmHg     Pulse Rate 04/11/16 0944 65     Resp 04/11/16 0944 16     Temp --      Temp src --      SpO2 04/11/16 0944 94 %     Weight 04/11/16 0959 203 lb (92.08 kg)     Height 04/11/16 0959  (1.575 m)     Head Cir --      Peak Flow --      Pain Score 04/11/16 0944 0     Pain Loc --      Pain Edu? --      Excl. in GC? --     General: Awake , Alert , and Oriented times 3; GCS 15 Head: Normal cephalic , atraumatic Eyes: Pupils equal , round, reactive to light Nose/Throat: No nasal drainage, patent upper airway without erythema or exudate.  Neck: Supple, Full range of motion, No anterior adenopathy or palpable thyroid masses Lungs: Clear to ascultation without wheezes , rhonchi, or rales Heart: Regular rate, regular rhythm without murmurs , gallops , or rubs Abdomen: Soft, non tender without rebound, guarding , or rigidity; bowel sounds positive and symmetric in all 4 quadrants. No organomegaly .        Extremities: 2 plus symmetric pulses. No edema, clubbing or cyanosis Neurologic: normal ambulation, Motor symmetric without deficits, sensory intact Skin: warm, dry, no rashes   Labs:   All laboratory work was reviewed including any pertinent negatives or positives listed below:  Labs Reviewed  COMPREHENSIVE METABOLIC PANEL - Abnormal; Notable for the following:    BUN 21 (*)    ALT 13 (*)    All other components within normal limits  CBC WITH DIFFERENTIAL/PLATELET  TROPONIN I  Laboratory work was reviewed and showed no clinically  significant abnormalities  EKG:   ED ECG REPORT I, Jennye Moccasin, the attending physician, personally viewed and interpreted this ECG.  Date: 04/11/2016 EKG Time: 0941 Rate: 62 Rhythm: normal sinus rhythm QRS Axis: normal Intervals: normal ST/T Wave abnormalities: normal Conduction Disturbances: none Narrative Interpretation: unremarkable  no acute ischemic changes   Radiology:      CT HEAD WO CONTRAST (Final result) Result time: 04/11/16 11:47:25   Final result by Rad Results In Interface (04/11/16 11:47:25)   Narrative:   CLINICAL DATA: The patient reports she had an episode of feeling hot this morning which has subsequently resolved.  EXAM: CT HEAD WITHOUT CONTRAST  TECHNIQUE: Contiguous axial images were obtained from the base of the skull through the vertex without intravenous contrast.  COMPARISON: Head CT scans 12/21/2015 and 12/31/2015.  FINDINGS: Chronic bilateral basal ganglia infarcts are again seen. Chronic microvascular ischemic change is also again noted. No evidence of acute intracranial abnormality including hemorrhage, infarct, mass lesion, mass effect, midline shift or abnormal extra-axial fluid collection is identified. No hydrocephalus or pneumocephalus. The calvarium is intact. Imaged paranasal sinuses and mastoid air cells are clear.  IMPRESSION: No acute abnormality. Stable compared to prior exams.   Electronically Signed By: Drusilla Kanner M.D. On: 04/11/2016 1      I personally reviewed the radiologic studies    ED Course: * Patient's stay here was uneventful and she had orthostatics which did not show any significant abnormalities. Patient's otherwise stable and I felt could be discharged. I felt that this was unlikely to be cardiogenic syncope at this time. Patient's loss of consciousness was gradual and the patient denies any pain or shortness of breath. Because of what sounds like some tonic activity I performed a  head CT to evaluate for possible seizure. I felt this was unlikely and the patient did not urinate, defecate, bite her tongue, etc.    Assessment:  Syncope   Final Clinical Impression:   Final diagnoses:  Syncope, vasovagal     Plan: * Patient was advised to follow up with her cardiologist. She's been advised to continue all of her current medications and I did not see a reason to adjust her move any of her meds at this time. Skin advised drink plenty of fluids and return here if she has any new concerns. Such as chest pain, shortness of breath, nausea, vomiting, fever. Patient was advised to return immediately if condition worsens. Patient was advised to follow up with their primary care physician or other specialized physicians involved in their outpatient care. The patient and/or family member/power of attorney had laboratory results reviewed at the bedside. All questions and concerns were addressed and appropriate discharge instructions were distributed by the nursing staff.             Jennye Moccasin, MD 04/12/16 [phone]

## 2016-04-11 NOTE — ED Notes (Signed)
Patient ambulated to bathroom with 1 assist.

## 2016-04-11 NOTE — ED Notes (Signed)
Arrives by EMS from Home.  Patient states while she was cooking breakfast this morning she felt "Hot" and sat down in a chair and "that was it".  Paitent states she does not remember anything else until she saw the paramedics.  Patient had a CVA in March 2017 and has a home health nurse in the home who witnessed this mornings episode.

## 2016-07-30 DIAGNOSIS — Z8673 Personal history of transient ischemic attack (TIA), and cerebral infarction without residual deficits: Secondary | ICD-10-CM | POA: Insufficient documentation

## 2016-10-29 DIAGNOSIS — G44221 Chronic tension-type headache, intractable: Secondary | ICD-10-CM | POA: Insufficient documentation

## 2017-06-03 ENCOUNTER — Emergency Department: Payer: Medicare Other

## 2017-06-03 ENCOUNTER — Observation Stay
Admission: EM | Admit: 2017-06-03 | Discharge: 2017-06-04 | Disposition: A | Discharge status: Home / Self Care | Payer: Medicare Other | Attending: Internal Medicine | Admitting: Internal Medicine

## 2017-06-03 ENCOUNTER — Encounter: Payer: Self-pay | Admitting: Emergency Medicine

## 2017-06-03 DIAGNOSIS — M19031 Primary osteoarthritis, right wrist: Secondary | ICD-10-CM | POA: Diagnosis not present

## 2017-06-03 DIAGNOSIS — E860 Dehydration: Secondary | ICD-10-CM | POA: Insufficient documentation

## 2017-06-03 DIAGNOSIS — I69398 Other sequelae of cerebral infarction: Secondary | ICD-10-CM | POA: Diagnosis not present

## 2017-06-03 DIAGNOSIS — M109 Gout, unspecified: Secondary | ICD-10-CM | POA: Diagnosis not present

## 2017-06-03 DIAGNOSIS — I69354 Hemiplegia and hemiparesis following cerebral infarction affecting left non-dominant side: Secondary | ICD-10-CM | POA: Diagnosis not present

## 2017-06-03 DIAGNOSIS — Z7982 Long term (current) use of aspirin: Secondary | ICD-10-CM | POA: Insufficient documentation

## 2017-06-03 DIAGNOSIS — Z87891 Personal history of nicotine dependence: Secondary | ICD-10-CM | POA: Diagnosis not present

## 2017-06-03 DIAGNOSIS — Z8249 Family history of ischemic heart disease and other diseases of the circulatory system: Secondary | ICD-10-CM | POA: Insufficient documentation

## 2017-06-03 DIAGNOSIS — E782 Mixed hyperlipidemia: Secondary | ICD-10-CM | POA: Insufficient documentation

## 2017-06-03 DIAGNOSIS — M064 Inflammatory polyarthropathy: Secondary | ICD-10-CM | POA: Insufficient documentation

## 2017-06-03 DIAGNOSIS — Z7902 Long term (current) use of antithrombotics/antiplatelets: Secondary | ICD-10-CM | POA: Diagnosis not present

## 2017-06-03 DIAGNOSIS — R531 Weakness: Secondary | ICD-10-CM | POA: Diagnosis present

## 2017-06-03 DIAGNOSIS — Z794 Long term (current) use of insulin: Secondary | ICD-10-CM | POA: Diagnosis not present

## 2017-06-03 DIAGNOSIS — E119 Type 2 diabetes mellitus without complications: Secondary | ICD-10-CM | POA: Insufficient documentation

## 2017-06-03 DIAGNOSIS — Z6836 Body mass index (BMI) 36.0-36.9, adult: Secondary | ICD-10-CM | POA: Insufficient documentation

## 2017-06-03 DIAGNOSIS — R569 Unspecified convulsions: Secondary | ICD-10-CM | POA: Insufficient documentation

## 2017-06-03 DIAGNOSIS — I10 Essential (primary) hypertension: Secondary | ICD-10-CM | POA: Diagnosis not present

## 2017-06-03 LAB — URINALYSIS, COMPLETE (UACMP) WITH MICROSCOPIC
BILIRUBIN URINE: NEGATIVE
Bacteria, UA: NONE SEEN
GLUCOSE, UA: NEGATIVE mg/dL
Hgb urine dipstick: NEGATIVE
KETONES UR: NEGATIVE mg/dL
LEUKOCYTES UA: NEGATIVE
NITRITE: NEGATIVE
PH: 5 (ref 5.0–8.0)
Protein, ur: NEGATIVE mg/dL
SPECIFIC GRAVITY, URINE: 1.021 (ref 1.005–1.030)

## 2017-06-03 LAB — COMPREHENSIVE METABOLIC PANEL
ALT: 14 U/L (ref 14–54)
ANION GAP: 8 (ref 5–15)
AST: 18 U/L (ref 15–41)
Albumin: 4 g/dL (ref 3.5–5.0)
Alkaline Phosphatase: 70 U/L (ref 38–126)
BUN: 27 mg/dL — ABNORMAL HIGH (ref 6–20)
CHLORIDE: 104 mmol/L (ref 101–111)
CO2: 27 mmol/L (ref 22–32)
Calcium: 9.9 mg/dL (ref 8.9–10.3)
Creatinine, Ser: 0.86 mg/dL (ref 0.44–1.00)
Glucose, Bld: 136 mg/dL — ABNORMAL HIGH (ref 65–99)
Potassium: 3.5 mmol/L (ref 3.5–5.1)
Sodium: 139 mmol/L (ref 135–145)
TOTAL PROTEIN: 7.9 g/dL (ref 6.5–8.1)
Total Bilirubin: 0.9 mg/dL (ref 0.3–1.2)

## 2017-06-03 LAB — GLUCOSE, CAPILLARY
GLUCOSE-CAPILLARY: 162 mg/dL — AB (ref 65–99)
GLUCOSE-CAPILLARY: 143 mg/dL — AB (ref 65–99)
Glucose-Capillary: 124 mg/dL — ABNORMAL HIGH (ref 65–99)

## 2017-06-03 LAB — CBC
HCT: 38.6 % (ref 35.0–47.0)
Hemoglobin: 13.4 g/dL (ref 12.0–16.0)
MCH: 29.2 pg (ref 26.0–34.0)
MCHC: 34.7 g/dL (ref 32.0–36.0)
MCV: 84.2 fL (ref 80.0–100.0)
PLATELETS: 215 10*3/uL (ref 150–440)
RBC: 4.59 MIL/uL (ref 3.80–5.20)
RDW: 13.3 % (ref 11.5–14.5)
WBC: 9 10*3/uL (ref 3.6–11.0)

## 2017-06-03 LAB — URIC ACID: Uric Acid, Serum: 8.7 mg/dL — ABNORMAL HIGH (ref 2.3–6.6)

## 2017-06-03 LAB — HEMOGLOBIN A1C
Hgb A1c MFr Bld: 5.7 % — ABNORMAL HIGH (ref 4.8–5.6)
MEAN PLASMA GLUCOSE: 116.89 mg/dL

## 2017-06-03 LAB — TROPONIN I

## 2017-06-03 MED ORDER — SODIUM CHLORIDE 0.9 % IV SOLN
INTRAVENOUS | Status: DC
  Administered 2017-06-03: 17:00:00 via INTRAVENOUS

## 2017-06-03 MED ORDER — ASPIRIN EC 81 MG PO TBEC
81.0000 mg | DELAYED_RELEASE_TABLET | Freq: Every day | ORAL | Status: DC
  Administered 2017-06-03 – 2017-06-04 (×2): 81 mg via ORAL
  Filled 2017-06-03 (×2): qty 1

## 2017-06-03 MED ORDER — ATORVASTATIN CALCIUM 20 MG PO TABS
40.0000 mg | ORAL_TABLET | Freq: Every day | ORAL | Status: DC
  Administered 2017-06-03: 40 mg via ORAL
  Filled 2017-06-03: qty 2

## 2017-06-03 MED ORDER — ENOXAPARIN SODIUM 40 MG/0.4ML ~~LOC~~ SOLN
40.0000 mg | SUBCUTANEOUS | Status: DC
  Administered 2017-06-03: 40 mg via SUBCUTANEOUS
  Filled 2017-06-03: qty 0.4

## 2017-06-03 MED ORDER — SODIUM CHLORIDE 0.9 % IV SOLN
1000.0000 mL | Freq: Once | INTRAVENOUS | Status: AC
  Administered 2017-06-03: 1000 mL via INTRAVENOUS

## 2017-06-03 MED ORDER — ONDANSETRON HCL 4 MG PO TABS: 4.0000 mg | ORAL_TABLET | Freq: Four times a day (QID) | ORAL | Status: DC | PRN

## 2017-06-03 MED ORDER — METHYLPREDNISOLONE SODIUM SUCC 125 MG IJ SOLR
60.0000 mg | Freq: Once | INTRAMUSCULAR | Status: AC
  Administered 2017-06-03: 60 mg via INTRAVENOUS
  Filled 2017-06-03: qty 2

## 2017-06-03 MED ORDER — OXYBUTYNIN CHLORIDE 5 MG PO TABS
5.0000 mg | ORAL_TABLET | Freq: Two times a day (BID) | ORAL | Status: DC
  Administered 2017-06-03 – 2017-06-04 (×2): 5 mg via ORAL
  Filled 2017-06-03 (×3): qty 1

## 2017-06-03 MED ORDER — LEVETIRACETAM 250 MG PO TABS
250.0000 mg | ORAL_TABLET | Freq: Two times a day (BID) | ORAL | Status: DC
  Administered 2017-06-03 – 2017-06-04 (×2): 250 mg via ORAL
  Filled 2017-06-03 (×3): qty 1

## 2017-06-03 MED ORDER — POLYETHYLENE GLYCOL 3350 17 G PO PACK: 17.0000 g | PACK | Freq: Every day | ORAL | Status: DC | PRN

## 2017-06-03 MED ORDER — METFORMIN HCL 500 MG PO TABS
1000.0000 mg | ORAL_TABLET | Freq: Two times a day (BID) | ORAL | Status: DC
  Administered 2017-06-03 – 2017-06-04 (×2): 1000 mg via ORAL
  Filled 2017-06-03 (×3): qty 2

## 2017-06-03 MED ORDER — INSULIN ASPART 100 UNIT/ML ~~LOC~~ SOLN: 0.0000 [IU] | Freq: Every day | SUBCUTANEOUS | Status: DC

## 2017-06-03 MED ORDER — PREDNISONE 50 MG PO TABS
50.0000 mg | ORAL_TABLET | Freq: Every day | ORAL | Status: DC
  Administered 2017-06-04: 50 mg via ORAL
  Filled 2017-06-03: qty 1

## 2017-06-03 MED ORDER — COLCHICINE 0.6 MG PO TABS
0.6000 mg | ORAL_TABLET | Freq: Two times a day (BID) | ORAL | Status: DC
  Administered 2017-06-03 – 2017-06-04 (×2): 0.6 mg via ORAL
  Filled 2017-06-03 (×2): qty 1

## 2017-06-03 MED ORDER — ACETAMINOPHEN 325 MG PO TABS: 650.0000 mg | ORAL_TABLET | Freq: Four times a day (QID) | ORAL | Status: DC | PRN

## 2017-06-03 MED ORDER — ONDANSETRON HCL 4 MG/2ML IJ SOLN: 4.0000 mg | Freq: Four times a day (QID) | INTRAMUSCULAR | Status: DC | PRN

## 2017-06-03 MED ORDER — INSULIN ASPART 100 UNIT/ML ~~LOC~~ SOLN
0.0000 [IU] | Freq: Three times a day (TID) | SUBCUTANEOUS | Status: DC
  Administered 2017-06-03 – 2017-06-04 (×2): 2 [IU] via SUBCUTANEOUS
  Administered 2017-06-04: 1 [IU] via SUBCUTANEOUS
  Filled 2017-06-03 (×3): qty 1

## 2017-06-03 MED ORDER — DILTIAZEM HCL 30 MG PO TABS
90.0000 mg | ORAL_TABLET | Freq: Two times a day (BID) | ORAL | Status: DC
  Administered 2017-06-03 – 2017-06-04 (×2): 90 mg via ORAL
  Filled 2017-06-03 (×2): qty 3

## 2017-06-03 MED ORDER — TRAMADOL HCL 50 MG PO TABS
50.0000 mg | ORAL_TABLET | Freq: Four times a day (QID) | ORAL | Status: DC | PRN
  Administered 2017-06-03: 50 mg via ORAL
  Filled 2017-06-03: qty 1

## 2017-06-03 NOTE — [place]2 (Signed)
[place] MEDICAID FL2 LEVEL OF CARE SCREENING TOOL     IDENTIFICATION  Patient Name: Emily [name] Birthdate: [birth year] Sex: female Admission Date (Current Location): 06/03/2017  [facility] [place]   Facility and Address:  [facility], [address], [place], [place] [address]      Provider Number: [id number]  Attending Physician Name and Address:  Enid BaasKalisetti, Radhika, MD  Relative Name and Phone Number:       Current Level of Care: Hospital Recommended Level of Care: Other (Comment) (Group Home) Prior Approval Number:    Date Approved/Denied:   PASRR Number:  [id number] )  Discharge Plan: Domiciliary (Rest home)    Current Diagnoses: Patient Active Problem List   Diagnosis Date Noted  . Gouty arthritis 06/03/2017  . Acute CVA (cerebrovascular accident) (HCC) 12/22/2015  . Syncope 12/21/2015  . Hyperlipidemia 06/23/2013  . DM (diabetes mellitus) (HCC) 06/23/2013  . HTN (hypertension) 06/23/2013  . Morbid obesity (HCC) 06/23/2013  . Abnormal ECG 06/23/2013    Orientation RESPIRATION BLADDER Height & Weight     Self, Situation, Place  Normal Continent Weight: 199 lb 3.2 oz (90.4 kg) Height:  5\' 2"  (157.5 cm)  BEHAVIORAL SYMPTOMS/MOOD NEUROLOGICAL BOWEL NUTRITION STATUS      Continent Diet (Diet: Heart Healthy/ Carb Modified )  AMBULATORY STATUS COMMUNICATION OF NEEDS Skin   Supervision Verbally Normal                       Personal Care Assistance Level of Assistance  Bathing, Feeding, Dressing Bathing Assistance: Limited assistance Feeding assistance: Independent Dressing Assistance: Limited assistance     Functional Limitations Info  Sight, Hearing, Speech Sight Info: Adequate Hearing Info: Adequate Speech Info: Adequate    SPECIAL CARE FACTORS FREQUENCY                       Contractures      Additional Factors Info  Code Status, Allergies Code Status Info:  (Full Code.  ) Allergies Info:  (Enalapril, Prednisone)           Current Medications (06/03/2017):  This is the current hospital active medication list Current Facility-Administered Medications  Medication Dose Route Frequency Provider Last Rate Last Dose  . 0.9 %  sodium chloride infusion   Intravenous Continuous Enid BaasKalisetti, Radhika, MD      . acetaminophen (TYLENOL) tablet 650 mg  650 mg Oral Q6H PRN Enid BaasKalisetti, Radhika, MD      . aspirin EC tablet 81 mg  81 mg Oral Daily Enid BaasKalisetti, Radhika, MD      . atorvastatin (LIPITOR) tablet 40 mg  40 mg Oral q1800 Enid BaasKalisetti, Radhika, MD      . colchicine tablet 0.6 mg  0.6 mg Oral BID Enid BaasKalisetti, Radhika, MD      . diltiazem (CARDIZEM) tablet 90 mg  90 mg Oral BID Enid BaasKalisetti, Radhika, MD      . enoxaparin (LOVENOX) injection 40 mg  40 mg Subcutaneous Q24H Enid BaasKalisetti, Radhika, MD      . insulin aspart (novoLOG) injection 0-5 Units  0-5 Units Subcutaneous QHS Enid BaasKalisetti, Radhika, MD      . insulin aspart (novoLOG) injection 0-9 Units  0-9 Units Subcutaneous TID WC Enid BaasKalisetti, Radhika, MD      . levETIRAcetam (KEPPRA) tablet 250 mg  250 mg Oral BID Enid BaasKalisetti, Radhika, MD      . metFORMIN (GLUCOPHAGE) tablet 1,000 mg  1,000 mg Oral BID WC Nemiah CommanderKalisetti, Radhika,  MD      . methylPREDNISolone sodium succinate (SOLU-MEDROL) 125 mg/2 mL injection 60 mg  60 mg Intravenous Once Enid Baas, MD      . ondansetron [facility]) tablet 4 mg  4 mg Oral Q6H PRN Enid Baas, MD       Or  . ondansetron (ZOFRAN) injection 4 mg  4 mg Intravenous Q6H PRN Enid Baas, MD      . oxybutynin (DITROPAN) tablet 5 mg  5 mg Oral BID Enid Baas, MD      . polyethylene glycol (MIRALAX / GLYCOLAX) packet 17 g  17 g Oral Daily PRN Enid Baas, MD      . Melene Muller ON 06/04/2017] predniSONE (DELTASONE) tablet 50 mg  50 mg Oral Q breakfast Enid Baas, MD      . traMADol Janean Sark) tablet 50 mg  50 mg Oral Q6H PRN Enid Baas, MD         Discharge  Medications: Please see discharge summary for a list of discharge medications.  Relevant Imaging Results:  Relevant Lab Results:   Additional Information  (SSN: [id number])  Djuana Littleton, Darleen Crocker, LCSW

## 2017-06-03 NOTE — Clinical Social Work Note (Addendum)
Clinical Social Work Assessment  Patient Details  Name: Emily [name] MRN: [id number] Date of Birth: Aug 28, 1946  Date of referral:  06/03/17               Reason for consult:  Other (Comment Required) ([facility] )                Permission sought to share information with:  Facility Industrial/product designerContact Representative Permission granted to share information::  Yes, Verbal Permission Granted  Name::      Group Home  Agency::     Relationship::     Contact Information:     Housing/Transportation Living arrangements for the past 2 months:  Group Home Source of Information:  Facility, Other (Comment Required) (Sister Emily [name]) Patient Interpreter Needed:  None Criminal Activity/Legal Involvement Pertinent to Current Situation/Hospitalization:  No - Comment as needed Significant Relationships:  Adult Children, Siblings Lives with:  Facility Resident Do you feel safe going back to the place where you live?  Yes Need for family participation in patient care:  Yes (Comment)  Care giving concerns:  Patient is a resident at [facility] located at [address], [place], [address]. Group home phone # [phone], fax # [phone].     Social Worker assessment / plan:  Visual merchandiserClinical Social Worker (CSW) reviewed chart and noted that patient is from [facility] [place]. CSW contacted group home owner Emily [name] [phone] to get additional information. Per Emily [name] patient has been a resident for almost 1 year now, walks without an assistive device at baseline and is on room air. Per Emily [name] patient does have a walker but she does not use it. Per Hennie DuosElibia patient is intermittently confused and her sister Emily [name] is her HPOA. Per Emily [name] patient can return to the group home and they will provide transport. Per chart patient is not alert and oriented so CSW contacted patient's sister Emily [name]. Per sister she is patient's HPOA and confirmed that [facility] is from [facility]. Sister is agreeable for patient to return to the group home when stable. CSW will continue to follow and assist as needed. FL2 complete.   Employment status:  Disabled (Comment on whether or not currently receiving Disability) Insurance information:  Medicare PT Recommendations:  Not assessed at this time Information / Referral to community resources:  Other (Comment Required) (Patient will return to Group Home. )  Patient/Family's Response to care:  Patient's sister is agreeable for her to return to the group home.   Patient/Family's Understanding of and Emotional Response to Diagnosis, Current Treatment, and Prognosis:  Patient's sister was very pleasant and thanked CSW for assistance.   Emotional Assessment Appearance:  Appears stated age Attitude/Demeanor/Rapport:  Unable to Assess Affect (typically observed):  Unable to Assess Orientation:  Oriented to Self, Oriented to Place, Oriented to Situation, Fluctuating Orientation (Suspected and/or reported Sundowners) Alcohol / Substance use:  Not Applicable Psych involvement (Current and /or in the community):  No (Comment)  Discharge Needs  Concerns to be addressed:  Discharge Planning Concerns Readmission within the last 30 days:  No Current discharge risk:  Chronically ill Barriers to Discharge:  Continued Medical Work up   [employer], Emily CrockerBailey M, LCSW 06/03/2017, 4:54 PM

## 2017-06-03 NOTE — Progress Notes (Signed)
Patient arrived to unit via stretcher. Pt alert to self, situation and place; disoriented to time. By patient, she started feeling weak yesterday morning and pain on right wrist increased. Prior to admission, she was walking on her own with no assistive equipment.  Admission process completed and skin assessment done. Family at bedside.   Stephannie PetersSusana G Patino Hernandez, RN

## 2017-06-03 NOTE — ED Triage Notes (Addendum)
[facility], per facility pt c/o weakness x1 day, pt has hx of stroke, pt c/o RT wrist pain as well with no known fall. Pt alert to self, VS stable. MD at bedside

## 2017-06-03 NOTE — ED Notes (Signed)
Pt given sandwich tray at this time  

## 2017-06-03 NOTE — H&P (Addendum)
[facility] [place] [facility]   PATIENT NAME: Emily [name]    MR#:  [id number]  DATE OF BIRTH:  [birth year]  DATE OF ADMISSION:  06/03/2017  PRIMARY CARE PHYSICIAN: Marguarite Arbour, MD   REQUESTING/REFERRING PHYSICIAN: Dr. Jene Every  CHIEF COMPLAINT:   Chief Complaint  Patient presents with  . Weakness    HISTORY OF PRESENT ILLNESS:  Emily [name]  is a 71 y.o. female with a known history of stroke with minimal residual facial droop and left-sided weakness, gout, hypertension, diabetes mellitus presents to hospital secondary to right wrist pain and also generalized weakness. Patient states that she woke up yesterday morning with swelling and redness of her right wrist and also associated with pain. She also felt generalized weakness. Denies any fevers or chills. No nausea or vomiting, no abdominal pain or chest pain. At baseline she is able to walk without any supporting devices.she has a chronic facial droop and minimal slurring of speech from her previous stroke. CT of the head was negative. And an MRI was pursued to rule out stroke for her symptoms. MRI did not show any acute findings. However patient was unable to get up and walk here, so being admitted.  PAST MEDICAL HISTORY:   Past Medical History:  Diagnosis Date  . Essential hypertension, benign   . Gout, unspecified   . Mixed hyperlipidemia   . Stroke [facility])    march 2017  . Syncope and collapse   . Type II or unspecified type diabetes mellitus without mention of complication, uncontrolled     PAST SURGICAL HISTORY:   Past Surgical History:  Procedure Laterality Date  . ABDOMINAL HYSTERECTOMY    . TEE WITHOUT CARDIOVERSION N/A 12/26/2015   Procedure: TRANSESOPHAGEAL ECHOCARDIOGRAM (TEE);  Surgeon: Dalia Heading, MD;  Location: [facility] ORS;  Service: Cardiovascular;  Laterality: N/A;  . TONSILLECTOMY AND ADENOIDECTOMY      SOCIAL HISTORY:   Social History  Substance Use  Topics  . Smoking status: Former Games developer  . Smokeless tobacco: Not on file  . Alcohol use Yes     Comment: socially    FAMILY HISTORY:   Family History  Problem Relation Age of Onset  . Heart disease Mother   . Heart attack Mother   . Hypertension Mother   . Heart disease Father   . Heart attack Father   . Hypertension Father   . Heart disease Brother   . Stroke Brother   . Heart attack Other     DRUG ALLERGIES:   Allergies  Allergen Reactions  . Enalapril   . Prednisone Palpitations    REVIEW OF SYSTEMS:   Review of Systems  Constitutional: Negative for chills, fever and malaise/fatigue.  HENT: Negative for ear discharge, ear pain, hearing loss and nosebleeds.   Eyes: Negative for blurred vision and double vision.  Respiratory: Negative for cough, shortness of breath and wheezing.   Cardiovascular: Negative for chest pain, palpitations and leg swelling.  Gastrointestinal: Negative for abdominal pain, constipation, diarrhea, nausea and vomiting.  Genitourinary: Negative for dysuria, frequency and urgency.  Musculoskeletal: Positive for myalgias.  Neurological: Positive for speech change, focal weakness and weakness. Negative for dizziness, sensory change, seizures and headaches.  Psychiatric/Behavioral: Negative for depression.    MEDICATIONS AT HOME:   Prior to Admission medications   Medication Sig Start Date End Date Taking? Authorizing Provider  aspirin EC 81 MG tablet Take 81 mg by mouth daily.   Yes [provider]  atorvastatin (LIPITOR) 40 MG tablet Take 1 tablet (40 mg total) by mouth daily at 6 PM. Patient taking differently: Take 40 mg by mouth every morning.  12/23/15  Yes Shaune Pollackhen, Qing, MD  clopidogrel (PLAVIX) 75 MG tablet Take 1 tablet (75 mg total) by mouth daily. 12/23/15  Yes Shaune Pollackhen, Qing, MD  diltiazem (CARDIZEM) 90 MG tablet Take 90 mg by mouth 2 (two) times daily.   Yes [provider]  hydrochlorothiazide (HYDRODIURIL) 25 MG tablet  Take 25 mg by mouth daily.   Yes [provider]  levETIRAcetam (KEPPRA) 250 MG tablet Take 250 mg by mouth 2 (two) times daily.   Yes [provider]  metFORMIN (GLUCOPHAGE) 1000 MG tablet Take 1 tablet (1,000 mg total) by mouth 2 (two) times daily with a meal. 12/28/15  Yes Delfino LovettShah, Vipul, MD  oxybutynin (DITROPAN) 5 MG tablet Take 5 mg by mouth 2 (two) times daily.   Yes [provider]  acetaminophen (TYLENOL) 325 MG tablet Take 650 mg by mouth every 6 (six) hours as needed.    [provider]  aspirin EC 325 MG tablet Take 1 tablet (325 mg total) by mouth daily. Patient not taking: Reported on 06/03/2017 12/26/15   Delfino LovettShah, Vipul, MD  insulin glargine (LANTUS) 100 UNIT/ML injection Inject 0.12 mLs (12 Units total) into the skin at bedtime. Patient not taking: Reported on 06/03/2017 12/26/15   Delfino LovettShah, Vipul, MD  polyethylene glycol (MIRALAX / GLYCOLAX) packet Take 17 g by mouth daily. Patient taking differently: Take 17 g by mouth daily as needed for moderate constipation.  12/26/15   Delfino LovettShah, Vipul, MD      VITAL SIGNS:  Blood pressure 138/74, pulse 71, temperature 97.9 F (36.6 C), temperature source Oral, resp. rate 18, SpO2 96 %.  PHYSICAL EXAMINATION:   Physical Exam  GENERAL:  71 y.o.-year-old patient lying in the bed with no acute distress.  EYES: Pupils equal, round, reactive to light and accommodation. No scleral icterus. Extraocular muscles intact.  HEENT: Head atraumatic, normocephalic. Left facial droop noted and right eyelid ptosis. Oropharynx and nasopharynx clear.  NECK:  Supple, no jugular venous distention. No thyroid enlargement, no tenderness.  LUNGS: Normal breath sounds bilaterally, no wheezing, rales,rhonchi or crepitation. No use of accessory muscles of respiration.  CARDIOVASCULAR: S1, S2 normal. No murmurs, rubs, or gallops.  ABDOMEN: Soft, nontender, nondistended. Bowel sounds present. No organomegaly or mass.  EXTREMITIES: No pedal edema,  cyanosis, or clubbing. Right wrist erythema and tenderness and swelling present NEUROLOGIC: Cranial nerves II through XII are intact. Muscle strength 4/5 in all extremities. Sensation intact. Gait not checked. Global weakness noted. PSYCHIATRIC: The patient is alert and oriented x 3.  SKIN: No obvious rash, lesion, or ulcer.   LABORATORY PANEL:   CBC  Recent Labs Lab 06/03/17 0941  WBC 9.0  HGB 13.4  HCT 38.6  PLT 215   ------------------------------------------------------------------------------------------------------------------  Chemistries   Recent Labs Lab 06/03/17 0941  NA 139  K 3.5  CL 104  CO2 27  GLUCOSE 136*  BUN 27*  CREATININE 0.86  CALCIUM 9.9  AST 18  ALT 14  ALKPHOS 70  BILITOT 0.9   ------------------------------------------------------------------------------------------------------------------  Cardiac Enzymes  Recent Labs Lab 06/03/17 0941  TROPONINI <0.03   ------------------------------------------------------------------------------------------------------------------  RADIOLOGY:  Dg Wrist Complete Right  Result Date: 06/03/2017 CLINICAL DATA:  Diffuse right wrist pain.  No known injury. EXAM: RIGHT WRIST - COMPLETE 3+ VIEW COMPARISON:  None. FINDINGS: No acute bony abnormality. Specifically, no fracture, subluxation,  or dislocation. Soft tissues are intact. Joint spaces are maintained. IMPRESSION: No acute bony abnormality. Electronically Signed   By: Charlett Nose M.D.   On: 06/03/2017 10:18   Ct Head Wo Contrast  Result Date: 06/03/2017 CLINICAL DATA:  Weakness, altered level of consciousness. EXAM: CT HEAD WITHOUT CONTRAST TECHNIQUE: Contiguous axial images were obtained from the base of the skull through the vertex without intravenous contrast. COMPARISON:  04/11/2016 FINDINGS: Brain: Chronic bilateral basal ganglia infarcts again noted, stable. There is atrophy and chronic small vessel disease changes. No acute intracranial  abnormality. Specifically, no hemorrhage, hydrocephalus, mass lesion, acute infarction, or significant intracranial injury. Vascular: No hyperdense vessel or unexpected calcification. Skull: No acute calvarial abnormality. Sinuses/Orbits: Visualized paranasal sinuses and mastoids clear. Orbital soft tissues unremarkable. Other: None IMPRESSION: Chronic small vessel disease and atrophy. Several chronic bilateral basal ganglia infarcts. No acute intracranial abnormality. Electronically Signed   By: Charlett Nose M.D.   On: 06/03/2017 10:51   Mr Brain Wo Contrast  Result Date: 06/03/2017 CLINICAL DATA:  Weakness.  History of stroke. EXAM: MRI HEAD WITHOUT CONTRAST TECHNIQUE: Multiplanar, multiecho pulse sequences of the brain and surrounding structures were obtained without intravenous contrast. COMPARISON:  Head CT 06/03/2017 and MRI 12/22/2015 FINDINGS: Brain: There is no evidence of acute infarct, mass, or extra-axial fluid collection. A chronic left basal ganglia infarct is again noted with chronic blood products, ex vacuo enlargement of the body and frontal horn of the left lateral ventricle, and slight leftward midline shift due to volume loss. Additional chronic infarcts are present in the right basal ganglia, posterior left temporal lobe, and corona radiata bilaterally. The bilateral corona radiata infarcts are new from the prior MRI. There is also a new chronic lacunar infarct in the right thalamus. There is wallerian degeneration in the corticospinal tracts bilaterally extending into the brainstem. Patchy T2 hyperintensities in the cerebral white matter have progressed from the prior MRI and are compatible with moderate chronic small vessel ischemic disease, advanced for age. 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 Unchanged asymmetric decreased caliber of the intracranial left ICA. Skull and upper cervical spine: Unremarkable bone marrow  signal. Sinuses/Orbits: Unremarkable orbits. Minimal bilateral ethmoid sinus mucosal thickening. No significant mastoid fluid. Other: None. IMPRESSION: 1. No acute intracranial abnormality. 2. Progression of chronic ischemic changes from 2017 including interval chronic bilateral corona radiata infarcts. Electronically Signed   By: Sebastian Ache M.D.   On: 06/03/2017 13:21   Dg Chest Port 1 View  Result Date: 06/03/2017 CLINICAL DATA:  Pain, swelling EXAM: PORTABLE CHEST 1 VIEW COMPARISON:  12/21/2015 FINDINGS: Mild left basilar opacity, likely atelectasis/ scarring. No focal consolidation. No pleural effusion or pneumothorax. The heart is normal in size. IMPRESSION: No evidence of acute cardiopulmonary disease. Electronically Signed   By: Charline Bills M.D.   On: 06/03/2017 10:21    EKG:   Orders placed or performed during the hospital encounter of 04/11/16  . ED EKG  . ED EKG  . EKG 12-Lead  . EKG 12-Lead    IMPRESSION AND PLAN:   Emily [name]  is a 71 y.o. female with a known history of stroke with minimal residual facial droop and left-sided weakness, gout, hypertension, diabetes mellitus presents to hospital secondary to right wrist pain and also generalized weakness.  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 -Uric acid ordered. Started on steroids and also colchicine. -Apply a wrist splint - Wrist X ray negative for any findings. If  no improvement by tomorrow- consult rheumatology -Occupational therapy consult.  #2 generalized weakness-MRI negative for acute infarct. -Urine analysis negative for infection. -Physical therapy consulted for the same.  #3 history of CVA-with minimal left-sided weakness. Continue aspirin, Plavix and statin. -Physical therapy consulted. Has chronic left facial droop. 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 Add sliding scale insulin  #5 DVT  prophylaxis-Lovenox    All the records are reviewed and case discussed with ED provider. Management plans discussed with the patient, family and they are in agreement.  CODE STATUS: Full code  TOTAL TIME TAKING CARE OF THIS PATIENT: 50 minutes.    Enid Baas M.D on 06/03/2017 at 2:35 PM  Between 7am to 6pm - Pager - [phone]  After 6pm go to [email or web address] - Social research officer, government  [facility]  Office  [phone]  CC: Primary care physician; Marguarite Arbour, MD

## 2017-06-03 NOTE — ED Provider Notes (Signed)
[facility] Emergency Department Provider Note   ____________________________________________    I have reviewed the triage vital signs and the nursing notes.   HISTORY  Chief Complaint Weakness     HPI Emily [name] is a 71 y.o. female Who presents from assisted living with complaints of weakness. Reportedly the patient has been "slower" than usual does have a history of stroke, unclear if she has any residual deficits. No fevers or chills reported. Patient denies pain except in her right wrist, no falls noted. No abdominal pain or dysuria.   Past Medical History:  Diagnosis Date  . Essential hypertension, benign   . Gout, unspecified   . Mixed hyperlipidemia   . Stroke [facility])    march 2017  . Syncope and collapse   . Type II or unspecified type diabetes mellitus without mention of complication, uncontrolled     Patient Active Problem List   Diagnosis Date Noted  . Acute CVA (cerebrovascular accident) (HCC) 12/22/2015  . Syncope 12/21/2015  . Hyperlipidemia 06/23/2013  . DM (diabetes mellitus) (HCC) 06/23/2013  . HTN (hypertension) 06/23/2013  . Morbid obesity (HCC) 06/23/2013  . Abnormal ECG 06/23/2013    Past Surgical History:  Procedure Laterality Date  . ABDOMINAL HYSTERECTOMY    . TEE WITHOUT CARDIOVERSION N/A 12/26/2015   Procedure: TRANSESOPHAGEAL ECHOCARDIOGRAM (TEE);  Surgeon: Dalia Heading, MD;  Location: [facility] ORS;  Service: Cardiovascular;  Laterality: N/A;  . TONSILLECTOMY AND ADENOIDECTOMY      Prior to Admission medications   Medication Sig Start Date End Date Taking? Authorizing Provider  aspirin EC 81 MG tablet Take 81 mg by mouth daily.   Yes [provider]  atorvastatin (LIPITOR) 40 MG tablet Take 1 tablet (40 mg total) by mouth daily at 6 PM. Patient taking differently: Take 40 mg by mouth every morning.  12/23/15  Yes Shaune Pollack, MD  clopidogrel (PLAVIX) 75 MG tablet Take 1 tablet (75 mg total) by  mouth daily. 12/23/15  Yes Shaune Pollack, MD  diltiazem (CARDIZEM) 90 MG tablet Take 90 mg by mouth 2 (two) times daily.   Yes [provider]  hydrochlorothiazide (HYDRODIURIL) 25 MG tablet Take 25 mg by mouth daily.   Yes [provider]  levETIRAcetam (KEPPRA) 250 MG tablet Take 250 mg by mouth 2 (two) times daily.   Yes [provider]  metFORMIN (GLUCOPHAGE) 1000 MG tablet Take 1 tablet (1,000 mg total) by mouth 2 (two) times daily with a meal. 12/28/15  Yes Delfino Lovett, MD  oxybutynin (DITROPAN) 5 MG tablet Take 5 mg by mouth 2 (two) times daily.   Yes [provider]  acetaminophen (TYLENOL) 325 MG tablet Take 650 mg by mouth every 6 (six) hours as needed.    [provider]  aspirin EC 325 MG tablet Take 1 tablet (325 mg total) by mouth daily. Patient not taking: Reported on 06/03/2017 12/26/15   Delfino Lovett, MD  insulin glargine (LANTUS) 100 UNIT/ML injection Inject 0.12 mLs (12 Units total) into the skin at bedtime. Patient not taking: Reported on 06/03/2017 12/26/15   Delfino Lovett, MD  polyethylene glycol (MIRALAX / GLYCOLAX) packet Take 17 g by mouth daily. Patient taking differently: Take 17 g by mouth daily as needed for moderate constipation.  12/26/15   Delfino Lovett, MD     Allergies Enalapril and Prednisone  Family History  Problem Relation Age of Onset  . Heart disease Mother   . Heart attack Mother   .  Hypertension Mother   . Heart disease Father   . Heart attack Father   . Hypertension Father   . Heart disease Brother   . Stroke Brother   . Heart attack Other     Social History Social History  Substance Use Topics  . Smoking status: Former Games developer  . Smokeless tobacco: Not on file  . Alcohol use Yes     Comment: socially    Review of Systems Constitutional: No fever/chills Eyes: No visual changes.  ENT: No sore throat. Cardiovascular: Denies chest pain. Respiratory: Denies shortness of breath. Gastrointestinal: No abdominal  pain.  No nausea, no vomiting.   Genitourinary: Negative for dysuria. Musculoskeletal: Negative for back pain. Skin: Negative for rash. Neurological: Negative for headaches    ____________________________________________   PHYSICAL EXAM:  VITAL SIGNS: ED Triage Vitals [06/03/17 0938]  Enc Vitals Group     BP (!) 147/82     Pulse Rate 77     Resp 16     Temp 97.9 F (36.6 C)     Temp Source Oral     SpO2 99 %     Weight      Height      Head Circumference      Peak Flow      Pain Score 10     Pain Loc      Pain Edu?      Excl. in GC?     Constitutional: Alert and oriented. No acute distress.  Eyes: Conjunctivae are normal.   Nose: No congestion/rhinnorhea. Mouth/Throat: Mucous membranes are moist.   Neck:  Painless ROM Cardiovascular: Normal rate, regular rhythm. Grossly normal heart sounds.  Good peripheral circulation. Respiratory: Normal respiratory effort.  No retractions. Lungs CTAB. Gastrointestinal: Soft and nontender. No distention.  No CVA tenderness. Genitourinary: deferred Musculoskeletal:   Warm and well perfused Neurologic:  Patient's speech is somewhat slow but is appropriate. No gross focal neurologic deficits are appreciated. Cranial nerves II through XII are normal Skin:  Skin is warm, dry and intact. No rash noted. Psychiatric: Mood and affect are normal. Speech and behavior are normal.  ____________________________________________   LABS (all labs ordered are listed, but only abnormal results are displayed)  Labs Reviewed  COMPREHENSIVE METABOLIC PANEL - Abnormal; Notable for the following:       Result Value   Glucose, Bld 136 (*)    BUN 27 (*)    All other components within normal limits  URINALYSIS, COMPLETE (UACMP) WITH MICROSCOPIC - Abnormal; Notable for the following:    Color, Urine YELLOW (*)    APPearance CLEAR (*)    Squamous Epithelial / LPF 0-5 (*)    All other components within normal limits  GLUCOSE, CAPILLARY - Abnormal;  Notable for the following:    Glucose-Capillary 124 (*)    All other components within normal limits  CBC  TROPONIN I   ____________________________________________  EKG  None ____________________________________________  RADIOLOGY  CT head unremarkable Chest x-ray unremarkable MRI no acute distress ____________________________________________   PROCEDURES  Procedure(s) performed: No    Critical Care performed: No ____________________________________________   INITIAL IMPRESSION / ASSESSMENT AND PLAN / ED COURSE  Pertinent labs & imaging results that were available during my care of the patient were reviewed by me and considered in my medical decision making (see chart for details).  Patient's workup was reassuring however given history of CVA and some slowness of speech MRI was obtained which was reassuring. However patient is unable to ambulate  she is typically able to do. Discussed with the hospitalist for admission    ____________________________________________   FINAL CLINICAL IMPRESSION(S) / ED DIAGNOSES  Final diagnoses:  Weakness  Dehydration      NEW MEDICATIONS STARTED DURING THIS VISIT:  New Prescriptions   No medications on file     Note:  This document was prepared using Dragon voice recognition software and may include unintentional dictation errors.    Jene EveryKinner, Waylan Busta, MD 06/03/17 [phone]

## 2017-06-03 NOTE — ED Notes (Signed)
Pt cleaned and dried at this time , clean brief placed on pt

## 2017-06-03 NOTE — ED Notes (Signed)
Pt cleaned of urine at this time, pt clean and dry, diaper applied at this time

## 2017-06-03 NOTE — ED Notes (Signed)
Patient transported to MRI 

## 2017-06-04 LAB — CBC
HEMATOCRIT: 37.7 % (ref 35.0–47.0)
Hemoglobin: 13.1 g/dL (ref 12.0–16.0)
MCH: 29.1 pg (ref 26.0–34.0)
MCHC: 34.8 g/dL (ref 32.0–36.0)
MCV: 83.6 fL (ref 80.0–100.0)
PLATELETS: 213 10*3/uL (ref 150–440)
RBC: 4.51 MIL/uL (ref 3.80–5.20)
RDW: 13.3 % (ref 11.5–14.5)
WBC: 6.8 10*3/uL (ref 3.6–11.0)

## 2017-06-04 LAB — BASIC METABOLIC PANEL
ANION GAP: 8 (ref 5–15)
BUN: 21 mg/dL — ABNORMAL HIGH (ref 6–20)
CHLORIDE: 107 mmol/L (ref 101–111)
CO2: 25 mmol/L (ref 22–32)
Calcium: 9 mg/dL (ref 8.9–10.3)
Creatinine, Ser: 0.59 mg/dL (ref 0.44–1.00)
GFR calc non Af Amer: >60 mL/min (ref >60)
Glucose, Bld: 186 mg/dL — ABNORMAL HIGH (ref 65–99)
POTASSIUM: 3.6 mmol/L (ref 3.5–5.1)
SODIUM: 140 mmol/L (ref 135–145)

## 2017-06-04 LAB — GLUCOSE, CAPILLARY
Glucose-Capillary: 151 mg/dL — ABNORMAL HIGH (ref 65–99)
Glucose-Capillary: 145 mg/dL — ABNORMAL HIGH (ref 65–99)

## 2017-06-04 MED ORDER — PREDNISONE 50 MG PO TABS: ORAL_TABLET | ORAL | 0 refills | Status: DC

## 2017-06-04 MED ORDER — COLCHICINE 0.6 MG PO TABS
0.6000 mg | ORAL_TABLET | Freq: Two times a day (BID) | ORAL | 0 refills | Status: DC
Start: 2017-06-04 — End: 2019-08-08

## 2017-06-04 NOTE — Evaluation (Signed)
Occupational Therapy Evaluation Patient Details Name: Emily [name] MRN: [id number] DOB: 1946/07/15 Today's Date: 06/04/2017    History of Present Illness Pt is a 71 y/o F who presented with R wrist pain and generalized weakness.  Pt with residual facial droops and weakness from previous stroke.  Pt admitted due to weakness and suspected gout R wrist.  CT of the head was negative. MRI did not show any acute findings. Pt's PMH includes stroke, gout, syncope.     Clinical Impression   Pt seen for OT evaluation with her sister Emily [name] present in room.  She is from [facility] where she was independent in ADLs but needed supervision for showering.  She presents with intermittent pain in R dorsal MCPs and has a wrist brace on which she stated has helped the pain.  She has full AROM in R UE and hand but had pain with squeezing or pushing tasks that require weight bearing.  Sensation is intact and very minimal swelling noted along MCPs.  Pt and her sister educated on precautions to allow R hand to rest and not use it for pushing off of chair, squeezing and using rec adaptive tech for dressing tech for socks and shoes with catalog given after items were reviewed (elastic shoe laces, sock aid and foam utensil holders if feeding is impacted any further).  Rec continued OT while in the hospital to monitor R hand pain and decreased fine motor use and review precautions and recommendations for ADLs.  Rec OT HH evaluation to assess safety in shower to prevent falls and monitor follow through of recommendations.        Follow Up Recommendations  Home health OT    Equipment Recommendations  Tub/shower bench;Other (comment) (rec new tub bench with a back )    Recommendations for Other Services       Precautions / Restrictions Precautions Precautions: Fall Required Braces or Orthoses: Other Brace/Splint Other Brace/Splint: R wrist brace due to suspected gout Restrictions Weight Bearing  Restrictions: No      Mobility Bed Mobility                  Transfers                      Balance                                           ADL either performed or assessed with clinical judgement   ADL Overall ADL's : At baseline                                       General ADL Comments: Pt is at baseline for ADLs and would benefit from OT evaluatuion from [facility] to ensure safety in bathroom since she has a tub bench but has fallen off it it twice since it does not have a back on it.  She has grab bars in tub but fell reaching for a towel rack near the grab bar.       Vision Baseline Vision/History: No visual deficits Patient Visual Report: No change from baseline       Perception     Praxis      Pertinent Vitals/Pain Pain Assessment: No/denies pain     Hand  Dominance Right   Extremity/Trunk Assessment Upper Extremity Assessment Upper Extremity Assessment: RUE deficits/detail RUE Deficits / Details: Full sensation and AROM but complains of soreness but not pain along back of MCPs mainly when squeezing or pushing with hand which is not recommended and reviewed precautions to allow R hand to rest in brace and not use for squeezing or weight bearign when going from sit to stand RUE Coordination: decreased fine motor   Lower Extremity Assessment Lower Extremity Assessment: Defer to PT evaluation       Communication Communication Communication: No difficulties   Cognition Arousal/Alertness: Awake/alert Behavior During Therapy: WFL for tasks assessed/performed Overall Cognitive Status: History of cognitive impairments - at baseline Area of Impairment: Orientation;Attention;Memory;Following commands;Safety/judgement;Awareness;Problem solving                 Orientation Level: Disoriented to;Place;Time Current Attention Level: Selective Memory: Decreased short-term memory Following Commands: Follows one step  commands with increased time;Follows one step commands inconsistently Safety/Judgement: Decreased awareness of safety Awareness: Intellectual Problem Solving: Slow processing;Decreased initiation;Difficulty sequencing;Requires verbal cues;Requires tactile cues General Comments: Per chart review, h/o intermittent confusion at baseline. She is able to answer all questions and perform simple ADLs but has a flat affect.   General Comments       Exercises     Shoulder Instructions      Home Living Family/patient expects to be discharged to:: Group home ([facility]) Living Arrangements: Group Home Available Help at Discharge: Available PRN/intermittently Type of Home: House Home Access: Ramped entrance     Home Layout: One level     Bathroom Shower/Tub: Walk-in shower         Home Equipment: Grab bars - tub/shower;Walker - 2 wheels;Tub bench   Additional Comments: tub bench does not have a back and she has fallen off of it twice      Prior Functioning/Environment Level of Independence: Needs assistance  Gait / Transfers Assistance Needed: Pt ambulates without AD.  Denies any recent falls.  Requires assist from staff for shower transfer but pt reports she can wash alone.   ADL's / Homemaking Assistance Needed: Meals provided at group home.  Pt independent with dressing.  Does not drive.            OT Problem List: Pain;Decreased coordination      OT Treatment/Interventions: Therapeutic activities;Patient/family education;DME and/or AE instruction    OT Goals(Current goals can be found in the care plan section) Acute Rehab OT Goals Patient Stated Goal: to go back home soon OT Goal Formulation: With patient/family Time For Goal Achievement: 06/18/17 Potential to Achieve Goals: Good ADL Goals Pt Will Perform Eating: with modified independence;with adaptive utensils (may benefit from foam utensil holder) Pt Will Perform Grooming: with modified  independence;with adaptive equipment;sitting Pt Will Perform Lower Body Dressing: sit to/from stand;Independently;with set-up  OT Frequency: Min 1X/week   Barriers to D/C:            Co-evaluation              AM-PAC PT "6 Clicks" Daily Activity     Outcome Measure Help from another person eating meals?: A Little Help from another person taking care of personal grooming?: A Little Help from another person toileting, which includes using toliet, bedpan, or urinal?: A Little Help from another person bathing (including washing, rinsing, drying)?: A Little Help from another person to put on and taking off regular upper body clothing?: A Little Help from another person to put on  and taking off regular lower body clothing?: A Little 6 Click Score: 18   End of Session    Activity Tolerance: Patient tolerated treatment well Patient left: in bed;with call bell/phone within reach;with chair alarm set;with family/visitor present  OT Visit Diagnosis: Muscle weakness (generalized) (M62.81);Pain Pain - Right/Left: Right Pain - part of body: Hand                Time: 1245-1312 OT Time Calculation (min): 27 min Charges:  OT General Charges $OT Visit: 1 Visit OT Evaluation $OT Eval Low Complexity: 1 Low OT Treatments $Self Care/Home Management : 8-22 mins G-Codes: OT G-codes **NOT FOR INPATIENT CLASS** Functional Assessment Tool Used: AM-PAC 6 Clicks Daily Activity;Clinical judgement Functional Limitation: Self care Self Care Current Status (Z6109): At least 1 percent but less than 20 percent impaired, limited or restricted Self Care Goal Status (U0454): At least 1 percent but less than 20 percent impaired, limited or restricted   Susanne Borders, OTR/L ascom [phone] 06/04/17, 1:30 PM

## 2017-06-04 NOTE — Discharge Summary (Addendum)
[facility] - [place] at [facility]  Emily [name], 71 y.o., DOB Feb 27, 1946, MRN [id number]. Admission date: 06/03/2017 Discharge Date 06/04/2017 Primary MD Emily [name], Emily D, MD Admitting Physician Emily Baasadhika Kalisetti, MD  Admission Diagnosis  Dehydration [E86.0] Weakness [R53.1]  Discharge Diagnosis   Active Problems:   Acute gouty flare   generalized weakness   Essential htn   Hyperlipemia   H/o cva         Hospital Course Emily DarkHelen [name]  is a 71 y.o. female with a known history of stroke with minimal residual facial droop and left-sided weakness, gout, hypertension, diabetes mellitus presents to hospital secondary to right wrist pain and also generalized weakness. Patient was thought to have acute gout flare and started on prednisone. And admitted to the hospital. She was seen by PT who recommended home PT. She currently resides in a group home and will need to go back.             Consults  None  Significant Tests:  See full reports for all details     Dg Wrist Complete Right  Result Date: 06/03/2017 CLINICAL DATA:  Diffuse right wrist pain.  No known injury. EXAM: RIGHT WRIST - COMPLETE 3+ VIEW COMPARISON:  None. FINDINGS: No acute bony abnormality. Specifically, no fracture, subluxation, or dislocation. Soft tissues are intact. Joint spaces are maintained. IMPRESSION: No acute bony abnormality. Electronically Signed   By: Charlett NoseKevin  Dover M.[name].   On: 06/03/2017 10:18   Ct Head Wo Contrast  Result Date: 06/03/2017 CLINICAL DATA:  Weakness, altered level of consciousness. EXAM: CT HEAD WITHOUT CONTRAST TECHNIQUE: Contiguous axial images were obtained from the base of the skull through the vertex without intravenous contrast. COMPARISON:  04/11/2016 FINDINGS: Brain: Chronic bilateral basal ganglia infarcts again noted, stable. There is atrophy and chronic small vessel disease changes. No acute intracranial abnormality. Specifically, no hemorrhage,  hydrocephalus, mass lesion, acute infarction, or significant intracranial injury. Vascular: No hyperdense vessel or unexpected calcification. Skull: No acute calvarial abnormality. Sinuses/Orbits: Visualized paranasal sinuses and mastoids clear. Orbital soft tissues unremarkable. Other: None IMPRESSION: Chronic small vessel disease and atrophy. Several chronic bilateral basal ganglia infarcts. No acute intracranial abnormality. Electronically Signed   By: Charlett NoseKevin  Dover M.[name].   On: 06/03/2017 10:51   Mr Brain Wo Contrast  Result Date: 06/03/2017 CLINICAL DATA:  Weakness.  History of stroke. EXAM: MRI HEAD WITHOUT CONTRAST TECHNIQUE: Multiplanar, multiecho pulse sequences of the brain and surrounding structures were obtained without intravenous contrast. COMPARISON:  Head CT 06/03/2017 and MRI 12/22/2015 FINDINGS: Brain: There is no evidence of acute infarct, mass, or extra-axial fluid collection. A chronic left basal ganglia infarct is again noted with chronic blood products, ex vacuo enlargement of the body and frontal horn of the left lateral ventricle, and slight leftward midline shift due to volume loss. Additional chronic infarcts are present in the right basal ganglia, posterior left temporal lobe, and corona radiata bilaterally. The bilateral corona radiata infarcts are new from the prior MRI. There is also a new chronic lacunar infarct in the right thalamus. There is wallerian degeneration in the corticospinal tracts bilaterally extending into the brainstem. Patchy T2 hyperintensities in the cerebral white matter have progressed from the prior MRI and are compatible with moderate chronic small vessel ischemic disease, advanced for age. 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 Unchanged asymmetric decreased caliber of the intracranial left ICA. Skull and upper cervical spine: Unremarkable bone marrow signal. Sinuses/Orbits: Unremarkable  orbits.  Minimal bilateral ethmoid sinus mucosal thickening. No significant mastoid fluid. Other: None. IMPRESSION: 1. No acute intracranial abnormality. 2. Progression of chronic ischemic changes from 2017 including interval chronic bilateral corona radiata infarcts. Electronically Signed   By: Sebastian Ache M.[name].   On: 06/03/2017 13:21   Dg Chest Port 1 View  Result Date: 06/03/2017 CLINICAL DATA:  Pain, swelling EXAM: PORTABLE CHEST 1 VIEW COMPARISON:  12/21/2015 FINDINGS: Mild left basilar opacity, likely atelectasis/ scarring. No focal consolidation. No pleural effusion or pneumothorax. The heart is normal in size. IMPRESSION: No evidence of acute cardiopulmonary disease. Electronically Signed   By: Charline Bills M.[name].   On: 06/03/2017 10:21       Today   Subjective:   Emily [name]  States that her hand swelling is better  Objective:   Blood pressure (!) 150/65, pulse (!) 49, temperature 97.6 F (36.4 C), temperature source Oral, resp. rate 12, height  (1.575 m), weight 199 lb 3.2 oz (90.4 kg), SpO2 100 %.  .  Intake/Output Summary (Last 24 hours) at 06/04/17 1534 Last data filed at 06/04/17 1045  Gross per 24 hour  Intake              360 ml  Output                0 ml  Net              360 ml    Exam VITAL SIGNS: Blood pressure (!) 150/65, pulse (!) 49, temperature 97.6 F (36.4 C), temperature source Oral, resp. rate 12, height  (1.575 m), weight 199 lb 3.2 oz (90.4 kg), SpO2 100 %.  GENERAL:  71 y.o.-year-old patient lying in the bed with no acute distress.  EYES: Pupils equal, round, reactive to light and accommodation. No scleral icterus. Extraocular muscles intact.  HEENT: Head atraumatic, normocephalic. Oropharynx and nasopharynx clear.  NECK:  Supple, no jugular venous distention. No thyroid enlargement, no tenderness.  LUNGS: Normal breath sounds bilaterally, no wheezing, rales,rhonchi or crepitation. No use of accessory muscles of respiration.   CARDIOVASCULAR: S1, S2 normal. No murmurs, rubs, or gallops.  ABDOMEN: Soft, nontender, nondistended. Bowel sounds present. No organomegaly or mass.  EXTREMITIES: left hand swelling better NEUROLOGIC: minimal residual right-sided facial droop and left-sided weakness PSYCHIATRIC: The patient is alert and oriented x 3.  SKIN: No obvious rash, lesion, or ulcer.   Data Review     CBC w Diff:  Lab Results  Component Value Date   WBC 6.8 06/04/2017   HGB 13.1 06/04/2017   HGB 11.1 (L) 01/02/2012   HCT 37.7 06/04/2017   HCT 33.4 (L) 01/02/2012   PLT 213 06/04/2017   PLT 309 01/02/2012   LYMPHOPCT 28 04/11/2016   LYMPHOPCT 20.6 01/02/2012   MONOPCT 7 04/11/2016   MONOPCT 9.4 01/02/2012   EOSPCT 2 04/11/2016   EOSPCT 0.5 01/02/2012   BASOPCT 1 04/11/2016   BASOPCT 1.2 01/02/2012   CMP:  Lab Results  Component Value Date   NA 140 06/04/2017   NA 136 01/02/2012   K 3.6 06/04/2017   K 3.6 01/02/2012   CL 107 06/04/2017   CL 102 01/02/2012   CO2 25 06/04/2017   CO2 25 01/02/2012   BUN 21 (H) 06/04/2017   BUN 51 (H) 01/02/2012   CREATININE 0.59 06/04/2017   CREATININE 2.24 (H) 01/02/2012   PROT 7.9 06/03/2017   ALBUMIN 4.0 06/03/2017   BILITOT 0.9 06/03/2017   ALKPHOS  70 06/03/2017   AST 18 06/03/2017   ALT 14 06/03/2017  .  Micro Results No results found for this or any previous visit (from the past 240 hour(s)).      Code Status Orders        Start     Ordered   06/03/17 1612  Full code  Continuous     06/03/17 1611    Code Status History    Date Active Date Inactive Code Status Order ID Comments User Context   12/21/2015 11:48 PM 12/26/2015  8:28 PM Full Code [id number]  Houston Siren, MD Inpatient          Follow-up Information    Emily Arbour, MD On 06/11/2017.   Specialty:  Internal Medicine Why:  @ 2:00 pm Contact information: [address] [facility] [place] [address] [phone]           Discharge  Medications   Allergies as of 06/04/2017      Reactions   Enalapril    Prednisone Palpitations      Medication List    STOP taking these medications   insulin glargine 100 UNIT/ML injection Commonly known as:  LANTUS     TAKE these medications   acetaminophen 325 MG tablet Commonly known as:  TYLENOL Take 650 mg by mouth every 6 (six) hours as needed.   aspirin EC 81 MG tablet Take 81 mg by mouth daily. What changed:  Another medication with the same name was removed. Continue taking this medication, and follow the directions you see here.   atorvastatin 40 MG tablet Commonly known as:  LIPITOR Take 1 tablet (40 mg total) by mouth daily at 6 PM. What changed:  when to take this   clopidogrel 75 MG tablet Commonly known as:  PLAVIX Take 1 tablet (75 mg total) by mouth daily.   colchicine 0.6 MG tablet Take 1 tablet (0.6 mg total) by mouth 2 (two) times daily.   diltiazem 90 MG tablet Commonly known as:  CARDIZEM Take 90 mg by mouth 2 (two) times daily.   hydrochlorothiazide 25 MG tablet Commonly known as:  HYDRODIURIL Take 25 mg by mouth daily.   levETIRAcetam 250 MG tablet Commonly known as:  KEPPRA Take 250 mg by mouth 2 (two) times daily.   metFORMIN 1000 MG tablet Commonly known as:  GLUCOPHAGE Take 1 tablet (1,000 mg total) by mouth 2 (two) times daily with a meal.   oxybutynin 5 MG tablet Commonly known as:  DITROPAN Take 5 mg by mouth 2 (two) times daily.   polyethylene glycol packet Commonly known as:  MIRALAX / GLYCOLAX Take 17 g by mouth daily. What changed:  when to take this  reasons to take this   predniSONE 50 MG tablet Commonly known as:  DELTASONE Take for 3 days then stop            Discharge Care Instructions        Start     Ordered   06/05/17 0000  predniSONE (DELTASONE) 50 MG tablet     06/04/17 1215   06/04/17 0000  colchicine 0.6 MG tablet  2 times daily     06/04/17 1215         Total Time in preparing  paper work, data evaluation and todays exam - 35 minutes  Auburn Bilberry M.[name] on 06/04/2017 at 3:34 PM  [facility]   Office  [phone]

## 2017-06-04 NOTE — Care Management Note (Signed)
Case Management Note  Patient Details  Name: Emily [name] MRN: [id number] Date of Birth: 01-27-1946  Subjective/Objective:   Patient from [facility]. Spoke with PT and the are recommending home health PT. Spoke with sister, [name]. She is agreeable to home health and has no agency preference. [facility] to [facility] for [facility]. Patient has a walker. No further needs identified.CSW updated.                 Action/Plan: [facility] for HHPT.   Expected Discharge Date:                  Expected Discharge Plan:  Home w Home Health Services  In-House Referral:     Discharge planning Services  CM Consult  Post Acute Care Choice:  Home Health Choice offered to:  Adult Children  DME Arranged:    DME Agency:     HH Arranged:  PT HH Agency:  [facility]  Status of Service:  In process, will continue to follow  If discussed at Long Length of Stay Meetings, dates discussed:    Additional Comments:  Marily MemosLisa M Lorijean Husser, RN 06/04/2017, 10:04 AM

## 2017-06-04 NOTE — Progress Notes (Signed)
Patient is alert and oriented and able to verbalize needs. No complaints of pain at this time. Sister at bedside. Discharge instructions gone over with both patient and family member at this time. Printed AVS and packet to go back to [facility] given to sister, [name]. Patient and family member verbalize understanding of all discharge instructions. No concerns voiced at this time. All belongings packed up. VSS. PIV removed. Volunteer services transporting patient to car via wc. Sister will transport patient back to group home.   Suzan SlickAlison L Kazden Largo, RN

## 2017-06-04 NOTE — Care Management Obs Status (Signed)
MEDICARE OBSERVATION STATUS NOTIFICATION   Patient Details  Name: Emily [name] MRN: [id number] Date of Birth: 02-Sep-1946   Medicare Observation Status Notification Given:  Yes    Marily MemosLisa M Sixto Bowdish, RN 06/04/2017, 9:10 AM

## 2017-06-04 NOTE — Evaluation (Signed)
Physical Therapy Evaluation Patient Details Name: Emily [name] MRN: [id number] DOB: [birth year] Today's Date: 06/04/2017   History of Present Illness  Pt is a 71 y/o F who presented with R wrist pain and generalized weakness.  Pt with residual facial droops and weakness from previous stroke.  Pt admitted due to weakness and suspected gout R wrist.  CT of the head was negative. MRI did not show any acute findings. Pt's PMH includes stroke, gout, syncope.      Clinical Impression  Pt admitted with above diagnosis. Pt currently with functional limitations due to the deficits listed below (see PT Problem List). Emily [name] presents with impaired cognition and per chart review the pt is from a group home where meals are provided and pt receives assist with shower transfers.  She currently requires min guard assist for transfers and ambulation using RW. Pt demonstrates slow but steady gait using RW.  Pt will benefit from skilled PT to increase their independence and safety with mobility to allow discharge to the venue listed below.      Follow Up Recommendations Home health PT;Supervision/Assistance - 24 hour    Equipment Recommendations  None recommended by PT (pt has RW)    Recommendations for Other Services OT consult     Precautions / Restrictions Precautions Precautions: Fall Required Braces or Orthoses: Other Brace/Splint Other Brace/Splint: R wrist brace due to suspected gout Restrictions Weight Bearing Restrictions: No      Mobility  Bed Mobility Overal bed mobility: Needs Assistance Bed Mobility: Supine to Sit     Supine to sit: Min assist;HOB elevated     General bed mobility comments: Pt able to achieve supine>sit using bed rail with increased time and effort and without physical assist.  However, pt requires assist to scoot to EOB due to difficulty.  Bed pad used to assist with this.   Transfers Overall transfer level: Needs assistance Equipment used:  Rolling walker (2 wheeled) Transfers: Sit to/from Stand Sit to Stand: Min guard         General transfer comment: Cues for proper hand placement and safe technique using RW.  Pt is slow to stand but controls descent to chair well.    Ambulation/Gait Ambulation/Gait assistance: Min guard Ambulation Distance (Feet): 60 Feet Assistive device: Rolling walker (2 wheeled) Gait Pattern/deviations: Step-through pattern;Decreased stride length;Trunk flexed Gait velocity: decreased Gait velocity interpretation: Below normal speed for age/gender General Gait Details: Decreased gait speed but pt with slow but steady gait.  Close min guard for safety.  Instructions provided to limit weight through RUE which pt does well and denies pain.    Stairs            Wheelchair Mobility    Modified Rankin (Stroke Patients Only)       Balance Overall balance assessment: Needs assistance Sitting-balance support: No upper extremity supported;Feet supported Sitting balance-Leahy Scale: Fair Sitting balance - Comments: Pt able to sit EOB without UE support but would likely lose her balance with perturbation   Standing balance support: Single extremity supported;During functional activity Standing balance-Leahy Scale: Poor Standing balance comment: Pt relies on at least 1UE support for static and dynamic activities                             Pertinent Vitals/Pain Pain Assessment: No/denies pain    Home Living Family/patient expects to be discharged to:: Group home Living Arrangements: Group Home Available Help at Discharge:  Available PRN/intermittently (Assist from Group Home staff) Type of Home: House Home Access: Ramped entrance     Home Layout: One level Home Equipment: Grab bars - tub/shower;Walker - 2 wheels      Prior Function Level of Independence: Needs assistance   Gait / Transfers Assistance Needed: Pt ambulates without AD.  Denies any recent falls.  Requires  assist from staff for shower transfer but pt reports she can wash alone.    ADL's / Homemaking Assistance Needed: Meals provided at group home.  Pt independent with dressing.  Does not drive.        Hand Dominance   Dominant Hand: Right    Extremity/Trunk Assessment   Upper Extremity Assessment Upper Extremity Assessment: Defer to OT evaluation    Lower Extremity Assessment Lower Extremity Assessment:  (BLE strength grossly 4-/5)       Communication   Communication: No difficulties  Cognition Arousal/Alertness: Awake/alert Behavior During Therapy: WFL for tasks assessed/performed Overall Cognitive Status: History of cognitive impairments - at baseline Area of Impairment: Orientation;Attention;Memory;Following commands;Safety/judgement;Awareness;Problem solving                 Orientation Level: Disoriented to;Place;Time Current Attention Level: Selective Memory: Decreased short-term memory (Repeats questions throughout session) Following Commands: Follows one step commands with increased time;Follows one step commands inconsistently Safety/Judgement: Decreased awareness of safety Awareness: Intellectual Problem Solving: Slow processing;Decreased initiation;Difficulty sequencing;Requires verbal cues;Requires tactile cues General Comments: Per chart review, h/o intermittent confusion at baseline.  Today she presents with limitation in cognition as documented above.       General Comments      Exercises     Assessment/Plan    PT Assessment Patient needs continued PT services  PT Problem List Decreased strength;Decreased activity tolerance;Decreased balance;Decreased cognition;Decreased knowledge of use of DME;Decreased safety awareness       PT Treatment Interventions DME instruction;Gait training;Functional mobility training;Therapeutic activities;Therapeutic exercise;Balance training;Neuromuscular re-education;Cognitive remediation;Patient/family  education;Modalities    PT Goals (Current goals can be found in the Care Plan section)  Acute Rehab PT Goals Patient Stated Goal: to continue feeling better PT Goal Formulation: With patient Time For Goal Achievement: 06/18/17 Potential to Achieve Goals: Good    Frequency Min 2X/week   Barriers to discharge        Co-evaluation               AM-PAC PT "6 Clicks" Daily Activity  Outcome Measure Difficulty turning over in bed (including adjusting bedclothes, sheets and blankets)?: A Lot Difficulty moving from lying on back to sitting on the side of the bed? : Unable Difficulty sitting down on and standing up from a chair with arms (e.g., wheelchair, bedside commode, etc,.)?: A Lot Help needed moving to and from a bed to chair (including a wheelchair)?: A Little Help needed walking in hospital room?: A Little Help needed climbing 3-5 steps with a railing? : A Lot 6 Click Score: 13    End of Session Equipment Utilized During Treatment: Other (comment) (R wrist brace) Activity Tolerance: Patient tolerated treatment well Patient left: in chair;with call bell/phone within reach;with chair alarm set Nurse Communication: Mobility status PT Visit Diagnosis: Muscle weakness (generalized) (M62.81);Unsteadiness on feet (R26.81)    Time: 8119-1478 PT Time Calculation (min) (ACUTE ONLY): 29 min   Charges:   PT Evaluation $PT Eval Low Complexity: 1 Low PT Treatments $Therapeutic Activity: 8-22 mins   PT G Codes:   PT G-Codes **NOT FOR INPATIENT CLASS** Functional Assessment Tool Used: AM-PAC 6 Clicks  Basic Mobility;Clinical judgement Functional Limitation: Mobility: Walking and moving around Mobility: Walking and Moving Around Current Status [phone](G8978): At least 40 percent but less than 60 percent impaired, limited or restricted Mobility: Walking and Moving Around Goal Status [phone](G8979): At least 1 percent but less than 20 percent impaired, limited or restricted    Encarnacion ChuAshley Afia Messenger  PT, DPT 06/04/2017, 10:35 AM

## 2017-06-04 NOTE — Discharge Instructions (Signed)
[facility] - [place] [facility]  DIET:  Diabetic diet  DISCHARGE CONDITION:  Stable  ACTIVITY:  Activity as tolerated  OXYGEN:  Home Oxygen: No.   Oxygen Delivery: room air  DISCHARGE LOCATION:  group home    ADDITIONAL DISCHARGE INSTRUCTION:   If you experience worsening of your admission symptoms, develop shortness of breath, life threatening emergency, suicidal or homicidal thoughts you must seek medical attention immediately by calling 911 or calling your MD immediately  if symptoms less severe.  You Must read complete instructions/literature along with all the possible adverse reactions/side effects for all the Medicines you take and that have been prescribed to you. Take any new Medicines after you have completely understood and accpet all the possible adverse reactions/side effects.   Please note  You were cared for by a hospitalist during your hospital stay. If you have any questions about your discharge medications or the care you received while you were in the hospital after you are discharged, you can call the unit and asked to speak with the hospitalist on call if the hospitalist that took care of you is not available. Once you are discharged, your primary care physician will handle any further medical issues. Please note that NO REFILLS for any discharge medications will be authorized once you are discharged, as it is imperative that you return to your primary care physician (or establish a relationship with a primary care physician if you do not have one) for your aftercare needs so that they can reassess your need for medications and monitor your lab values.

## 2017-06-04 NOTE — [place] FL2 (Deleted)
[place] MEDICAID FL2 LEVEL OF CARE SCREENING TOOL     IDENTIFICATION  Patient Name: Emily [name] Birthdate: 29-Sep-1945 Sex: female Admission Date (Current Location): 06/03/2017  [facility] [place]   Facility and Address:  [facility], [address], [place], [place] [address]      Provider Number: [id number]  Attending Physician Name and Address:  Auburn BilberryPatel, Shreyang, MD  Relative Name and Phone Number:       Current Level of Care: Hospital Recommended Level of Care: Other (Comment) (Group Home) Prior Approval Number:    Date Approved/Denied:   PASRR Number:  ([id number] )  Discharge Plan: Domiciliary (Rest home)    Current Diagnoses: Patient Active Problem List   Diagnosis Date Noted  . Gouty arthritis 06/03/2017  . Acute CVA (cerebrovascular accident) (HCC) 12/22/2015  . Syncope 12/21/2015  . Hyperlipidemia 06/23/2013  . DM (diabetes mellitus) (HCC) 06/23/2013  . HTN (hypertension) 06/23/2013  . Morbid obesity (HCC) 06/23/2013  . Abnormal ECG 06/23/2013    Orientation RESPIRATION BLADDER Height & Weight     Self, Situation, Place  Normal Continent Weight: 199 lb 3.2 oz (90.4 kg) Height:  5\' 2"  (157.5 cm)  BEHAVIORAL SYMPTOMS/MOOD NEUROLOGICAL BOWEL NUTRITION STATUS      Continent Diet (Diet: Heart Healthy/ Carb Modified )  AMBULATORY STATUS COMMUNICATION OF NEEDS Skin   Supervision Verbally Normal                       Personal Care Assistance Level of Assistance  Bathing, Feeding, Dressing Bathing Assistance: Limited assistance Feeding assistance: Independent Dressing Assistance: Limited assistance     Functional Limitations Info  Sight, Hearing, Speech Sight Info: Adequate Hearing Info: Adequate Speech Info: Adequate    SPECIAL CARE FACTORS FREQUENCY                       Contractures      Additional Factors Info  Code Status, Allergies Code Status Info:  (Full Code.  ) Allergies Info:  (Enalapril, Prednisone)           Current Medications (06/04/2017):  This is the current hospital active medication list Current Facility-Administered Medications  Medication Dose Route Frequency Provider Last Rate Last Dose  . 0.9 %  sodium chloride infusion   Intravenous Continuous Enid BaasKalisetti, Radhika, MD 75 mL/hr at 06/03/17 1712    . acetaminophen (TYLENOL) tablet 650 mg  650 mg Oral Q6H PRN Enid BaasKalisetti, Radhika, MD      . aspirin EC tablet 81 mg  81 mg Oral Daily Enid BaasKalisetti, Radhika, MD   81 mg at 06/04/17 0935  . atorvastatin (LIPITOR) tablet 40 mg  40 mg Oral q1800 Enid BaasKalisetti, Radhika, MD   40 mg at 06/03/17 1712  . colchicine tablet 0.6 mg  0.6 mg Oral BID Enid BaasKalisetti, Radhika, MD   0.6 mg at 06/04/17 0935  . diltiazem (CARDIZEM) tablet 90 mg  90 mg Oral BID Enid BaasKalisetti, Radhika, MD   90 mg at 06/04/17 0935  . enoxaparin (LOVENOX) injection 40 mg  40 mg Subcutaneous Q24H Enid BaasKalisetti, Radhika, MD   40 mg at 06/03/17 2119  . insulin aspart (novoLOG) injection 0-5 Units  0-5 Units Subcutaneous QHS Enid BaasKalisetti, Radhika, MD      . insulin aspart (novoLOG) injection 0-9 Units  0-9 Units Subcutaneous TID WC Enid BaasKalisetti, Radhika, MD   1 Units at 06/04/17 1220  . levETIRAcetam (KEPPRA) tablet 250 mg  250 mg Oral BID Nemiah CommanderKalisetti,  Radhika, MD   250 mg at 06/04/17 0935  . metFORMIN (GLUCOPHAGE) tablet 1,000 mg  1,000 mg Oral BID WC Enid Baas, MD   1,000 mg at 06/04/17 0818  . ondansetron (ZOFRAN) tablet 4 mg  4 mg Oral Q6H PRN Enid Baas, MD       Or  . ondansetron (ZOFRAN) injection 4 mg  4 mg Intravenous Q6H PRN Enid Baas, MD      . oxybutynin (DITROPAN) tablet 5 mg  5 mg Oral BID Enid Baas, MD   5 mg at 06/04/17 0935  . polyethylene glycol (MIRALAX / GLYCOLAX) packet 17 g  17 g Oral Daily PRN Enid Baas, MD      . predniSONE (DELTASONE) tablet 50 mg  50 mg Oral Q breakfast Enid Baas, MD   50 mg at 06/04/17 0818  . traMADol (ULTRAM) tablet  50 mg  50 mg Oral Q6H PRN Enid Baas, MD   50 mg at 06/03/17 1711     Discharge Medications: Please see discharge summary for a list of discharge medications.  Relevant Imaging Results:  Relevant Lab Results:   Additional Information  (SSN: [id number])  Sibley Rolison, Darleen Crocker, LCSW

## 2017-06-04 NOTE — [place] FL2 (Signed)
[place] MEDICAID FL2 LEVEL OF CARE SCREENING TOOL     IDENTIFICATION  Patient Name: Emily [name] Birthdate: 12/20/1945 Sex: female Admission Date (Current Location): 06/03/2017  [facility] and [place] Number:  [facility] and Address:  [facility], [address] [place], [place] [address]      Provider Number: [id number]  Attending Physician Name and Address:  Auburn Bilberry, MD  Relative Name and Phone Number:       Current Level of Care: Hospital Recommended Level of Care: Other (Comment) (Group Home) Prior Approval Number:    Date Approved/Denied:   PASRR Number:  ([id number] )  Discharge Plan: Domiciliary (Rest home)    Current Diagnoses: Patient Active Problem List   Diagnosis Date Noted  . Gouty arthritis 06/03/2017  . Acute CVA (cerebrovascular accident) (HCC) 12/22/2015  . Syncope 12/21/2015  . Hyperlipidemia 06/23/2013  . DM (diabetes mellitus) (HCC) 06/23/2013  . HTN (hypertension) 06/23/2013  . Morbid obesity (HCC) 06/23/2013  . Abnormal ECG 06/23/2013    Orientation RESPIRATION BLADDER Height & Weight     Self, Situation, Place  Normal Continent Weight: 199 lb 3.2 oz (90.4 kg) Height:   (157.5 cm)  BEHAVIORAL SYMPTOMS/MOOD NEUROLOGICAL BOWEL NUTRITION STATUS      Continent Diet (Diet: Heart Healthy/ Carb Modified )  AMBULATORY STATUS COMMUNICATION OF NEEDS Skin   Supervision Verbally Normal                       Personal Care Assistance Level of Assistance  Bathing, Feeding, Dressing Bathing Assistance: Limited assistance Feeding assistance: Independent Dressing Assistance: Limited assistance     Functional Limitations Info  Sight, Hearing, Speech Sight Info: Adequate Hearing Info: Adequate Speech Info: Adequate    SPECIAL CARE FACTORS FREQUENCY   PT home health   2-3                   Contractures      Additional Factors Info  Code Status, Allergies Code Status Info:   (Full Code. ) Allergies Info:  (Enalapril, Prednisone)          Discharge Medications: Please see discharge summary for a list of discharge medications. Medication List             TAKE these medications            acetaminophen 325 MG tablet Commonly known as:  TYLENOL Take 650 mg by mouth every 6 (six) hours as needed.    aspirin EC 81 MG tablet Take 81 mg by mouth daily.    aspirin EC 325 MG tablet Take 1 tablet (325 mg total) by mouth daily.    atorvastatin 40 MG tablet Commonly known as:  LIPITOR Take 1 tablet (40 mg total) by mouth daily at 6 PM. What changed:  when to take this    clopidogrel 75 MG tablet Commonly known as:  PLAVIX Take 1 tablet (75 mg total) by mouth daily.    colchicine 0.6 MG tablet Take 1 tablet (0.6 mg total) by mouth 2 (two) times daily.    diltiazem 90 MG tablet Commonly known as:  CARDIZEM Take 90 mg by mouth 2 (two) times daily.    hydrochlorothiazide 25 MG tablet Commonly known as:  HYDRODIURIL Take 25 mg by mouth daily.    insulin glargine 100 UNIT/ML injection Commonly known as:  LANTUS Inject 0.12 mLs (12 Units total) into the skin at bedtime.    levETIRAcetam 250  MG tablet Commonly known as:  KEPPRA Take 250 mg by mouth 2 (two) times daily.    metFORMIN 1000 MG tablet Commonly known as:  GLUCOPHAGE Take 1 tablet (1,000 mg total) by mouth 2 (two) times daily with a meal.    oxybutynin 5 MG tablet Commonly known as:  DITROPAN Take 5 mg by mouth 2 (two) times daily.    polyethylene glycol packet Commonly known as:  MIRALAX / GLYCOLAX Take 17 g by mouth daily. What changed:  when to take this  reasons to take this    predniSONE 50 MG tablet Commonly known as:  DELTASONE Take for 3 days then stop    Relevant Imaging Results: Relevant Lab Results: Additional Information  (SSN: [id number])  Jessie Cowher, Darleen CrockerBailey M, LCSW

## 2017-06-04 NOTE — Progress Notes (Signed)
Patient is medically stable for D/C back to [facility] today. PT is recommending home health. RN case manager made arrangements for home health. Clinical Social Worker (CSW) contacted group home owner [facility] who reported that patient can rChild psychotherapisteturn today and patient's sister [name] will transport. CSW faxed FL2 and D/C summary to [facility] and prepared D/C packet. RN will call report. CSW contacted patient's sister [name] who reported that [facility] and will provide transport for patient. Please reconsult if future social work needs arise. CSW signing off.   Baker Hughes IncorporatedBailey Jourdan Maldonado, LCSW [phone]

## 2017-06-04 NOTE — [place] FL2 (Addendum)
[place] MEDICAID FL2 LEVEL OF CARE SCREENING TOOL     IDENTIFICATION  Patient Name: Emily [name] Birthdate: 1945/12/04 Sex: female Admission Date (Current Location): 06/03/2017  [place]   Facility and Address:  [facility], [address], [place], [place] [address]      Provider Number: [id number]  Attending Physician Name and Address:  No att. providers found  Relative Name and Phone Number:       Current Level of Care: Hospital Recommended Level of Care: Other (Comment) (Group Home) Prior Approval Number:    Date Approved/Denied:   PASRR Number:  ([id number] )  Discharge Plan: Domiciliary (Rest home)    Current Diagnoses: Patient Active Problem List   Diagnosis Date Noted  . Gouty arthritis 06/03/2017  . Acute CVA (cerebrovascular accident) (HCC) 12/22/2015  . Syncope 12/21/2015  . Hyperlipidemia 06/23/2013  . DM (diabetes mellitus) (HCC) 06/23/2013  . HTN (hypertension) 06/23/2013  . Morbid obesity (HCC) 06/23/2013  . Abnormal ECG 06/23/2013    Orientation RESPIRATION BLADDER Height & Weight     Self, Situation, Place  Normal Continent Weight: 199 lb 3.2 oz (90.4 kg) Height:  5\' 2"  (157.5 cm)  BEHAVIORAL SYMPTOMS/MOOD NEUROLOGICAL BOWEL NUTRITION STATUS      Continent Diet (Diet: Heart Healthy/ Carb Modified )  AMBULATORY STATUS COMMUNICATION OF NEEDS Skin   Supervision Verbally Normal                       Personal Care Assistance Level of Assistance  Bathing, Feeding, Dressing Bathing Assistance: Limited assistance Feeding assistance: Independent Dressing Assistance: Limited assistance     Functional Limitations Info  Sight, Hearing, Speech Sight Info: Adequate Hearing Info: Adequate Speech Info: Adequate    SPECIAL CARE FACTORS FREQUENCY   PT home health   2-3                  Contractures      Additional Factors Info  Code Status, Allergies Code Status  Info:  (Full Code. ) Allergies Info:  (Enalapril, Prednisone)          Discharge Medications: Please see discharge summary for a list of discharge medications. Medication List             STOP taking these medications            insulin glargine 100 UNIT/ML injection Commonly known as:  LANTUS                       TAKE these medications            acetaminophen 325 MG tablet Commonly known as:  TYLENOL Take 650 mg by mouth every 6 (six) hours as needed.    aspirin EC 81 MG tablet Take 81 mg by mouth daily. What changed:  Another medication with the same name was removed. Continue taking this medication, and follow the directions you see here.    atorvastatin 40 MG tablet Commonly known as:  LIPITOR Take 1 tablet (40 mg total) by mouth daily at 6 PM. What changed:  when to take this    clopidogrel 75 MG tablet Commonly known as:  PLAVIX Take 1 tablet (75 mg total) by mouth daily.    colchicine 0.6 MG tablet Take 1 tablet (0.6 mg total) by mouth 2 (two) times daily.    diltiazem 90 MG tablet Commonly known as:  CARDIZEM Take 90 mg by  mouth 2 (two) times daily.    hydrochlorothiazide 25 MG tablet Commonly known as:  HYDRODIURIL Take 25 mg by mouth daily.    levETIRAcetam 250 MG tablet Commonly known as:  KEPPRA Take 250 mg by mouth 2 (two) times daily.    metFORMIN 1000 MG tablet Commonly known as:  GLUCOPHAGE Take 1 tablet (1,000 mg total) by mouth 2 (two) times daily with a meal.    oxybutynin 5 MG tablet Commonly known as:  DITROPAN Take 5 mg by mouth 2 (two) times daily.    polyethylene glycol packet Commonly known as:  MIRALAX / GLYCOLAX Take 17 g by mouth daily. What changed:  when to take this  reasons to take this    predniSONE 50 MG tablet Commonly known as:  DELTASONE Take for 3 days then stop               Relevant Imaging Results: Relevant Lab Results: Additional Information  (SSN: [id number])  Sample, Darleen Crocker, LCSW

## 2017-06-04 NOTE — Care Management Note (Signed)
Case Management Note  Patient Details  Name: Emily [name] MRN: [id number] Date of Birth: 08-10-1946  Subjective/Objective: Discharging today                   Action/Plan: [facility] notified of dsicharge  Expected Discharge Date:  06/04/17               Expected Discharge Plan:  Home w Home Health Services  In-House Referral:     Discharge planning Services  CM Consult  Post Acute Care Choice:  Home Health Choice offered to:  Adult Children  DME Arranged:    DME Agency:     HH Arranged:  PT HH Agency:  [facility]  Status of Service:  Completed, signed off  If discussed at Long Length of Stay Meetings, dates discussed:    Additional Comments:  Marily MemosLisa M Natale Thoma, RN 06/04/2017, 1:47 PM

## 2017-06-09 ENCOUNTER — Emergency Department: Payer: Medicare Other

## 2017-06-09 ENCOUNTER — Observation Stay: Payer: Medicare Other

## 2017-06-09 ENCOUNTER — Observation Stay
Admission: EM | Admit: 2017-06-09 | Discharge: 2017-06-10 | Disposition: A | Discharge status: Home / Self Care | Payer: Medicare Other | Attending: Internal Medicine | Admitting: Internal Medicine

## 2017-06-09 DIAGNOSIS — M6281 Muscle weakness (generalized): Secondary | ICD-10-CM | POA: Insufficient documentation

## 2017-06-09 DIAGNOSIS — E782 Mixed hyperlipidemia: Secondary | ICD-10-CM | POA: Diagnosis not present

## 2017-06-09 DIAGNOSIS — R4701 Aphasia: Secondary | ICD-10-CM | POA: Insufficient documentation

## 2017-06-09 DIAGNOSIS — Z87891 Personal history of nicotine dependence: Secondary | ICD-10-CM | POA: Diagnosis not present

## 2017-06-09 DIAGNOSIS — R55 Syncope and collapse: Secondary | ICD-10-CM | POA: Diagnosis present

## 2017-06-09 DIAGNOSIS — Z6836 Body mass index (BMI) 36.0-36.9, adult: Secondary | ICD-10-CM | POA: Insufficient documentation

## 2017-06-09 DIAGNOSIS — Z8673 Personal history of transient ischemic attack (TIA), and cerebral infarction without residual deficits: Secondary | ICD-10-CM | POA: Insufficient documentation

## 2017-06-09 DIAGNOSIS — Z79899 Other long term (current) drug therapy: Secondary | ICD-10-CM | POA: Insufficient documentation

## 2017-06-09 DIAGNOSIS — G459 Transient cerebral ischemic attack, unspecified: Principal | ICD-10-CM

## 2017-06-09 DIAGNOSIS — R2689 Other abnormalities of gait and mobility: Secondary | ICD-10-CM | POA: Insufficient documentation

## 2017-06-09 DIAGNOSIS — Z7902 Long term (current) use of antithrombotics/antiplatelets: Secondary | ICD-10-CM | POA: Insufficient documentation

## 2017-06-09 DIAGNOSIS — M109 Gout, unspecified: Secondary | ICD-10-CM | POA: Diagnosis not present

## 2017-06-09 DIAGNOSIS — I10 Essential (primary) hypertension: Secondary | ICD-10-CM | POA: Insufficient documentation

## 2017-06-09 DIAGNOSIS — E119 Type 2 diabetes mellitus without complications: Secondary | ICD-10-CM | POA: Insufficient documentation

## 2017-06-09 DIAGNOSIS — Z7982 Long term (current) use of aspirin: Secondary | ICD-10-CM | POA: Diagnosis not present

## 2017-06-09 DIAGNOSIS — I639 Cerebral infarction, unspecified: Secondary | ICD-10-CM

## 2017-06-09 DIAGNOSIS — R001 Bradycardia, unspecified: Secondary | ICD-10-CM

## 2017-06-09 DIAGNOSIS — Z794 Long term (current) use of insulin: Secondary | ICD-10-CM | POA: Insufficient documentation

## 2017-06-09 LAB — COMPREHENSIVE METABOLIC PANEL
ALK PHOS: 62 U/L (ref 38–126)
ALT: 31 U/L (ref 14–54)
ANION GAP: 10 (ref 5–15)
AST: 29 U/L (ref 15–41)
Albumin: 3.4 g/dL — ABNORMAL LOW (ref 3.5–5.0)
BILIRUBIN TOTAL: 0.6 mg/dL (ref 0.3–1.2)
BUN: 28 mg/dL — ABNORMAL HIGH (ref 6–20)
CALCIUM: 9.9 mg/dL (ref 8.9–10.3)
CO2: 27 mmol/L (ref 22–32)
Chloride: 101 mmol/L (ref 101–111)
Creatinine, Ser: 1.08 mg/dL — ABNORMAL HIGH (ref 0.44–1.00)
GFR calc non Af Amer: 52 mL/min — ABNORMAL LOW (ref >60)
GFR, EST AFRICAN AMERICAN: 60 mL/min — AB (ref >60)
GLUCOSE: 121 mg/dL — AB (ref 65–99)
Potassium: 3.2 mmol/L — ABNORMAL LOW (ref 3.5–5.1)
Sodium: 138 mmol/L (ref 135–145)
TOTAL PROTEIN: 6.9 g/dL (ref 6.5–8.1)

## 2017-06-09 LAB — CBC
HEMATOCRIT: 42.5 % (ref 35.0–47.0)
Hemoglobin: 14.7 g/dL (ref 12.0–16.0)
MCH: 29.4 pg (ref 26.0–34.0)
MCHC: 34.5 g/dL (ref 32.0–36.0)
MCV: 85.3 fL (ref 80.0–100.0)
Platelets: 290 10*3/uL (ref 150–440)
RBC: 4.99 MIL/uL (ref 3.80–5.20)
RDW: 13.6 % (ref 11.5–14.5)
WBC: 8.9 10*3/uL (ref 3.6–11.0)

## 2017-06-09 LAB — LIPID PANEL
CHOL/HDL RATIO: 3.3 ratio
Cholesterol: 132 mg/dL (ref 0–200)
HDL: 40 mg/dL — AB (ref >40)
LDL Cholesterol: 68 mg/dL (ref 0–99)
Triglycerides: 121 mg/dL (ref <150)
VLDL: 24 mg/dL (ref 0–40)

## 2017-06-09 LAB — TROPONIN I: Troponin I: <0.03 ng/mL (ref <0.03)

## 2017-06-09 LAB — GLUCOSE, CAPILLARY
GLUCOSE-CAPILLARY: 93 mg/dL (ref 65–99)
Glucose-Capillary: 91 mg/dL (ref 65–99)

## 2017-06-09 MED ORDER — METFORMIN HCL 500 MG PO TABS
1000.0000 mg | ORAL_TABLET | Freq: Two times a day (BID) | ORAL | Status: DC
  Administered 2017-06-09 – 2017-06-10 (×2): 1000 mg via ORAL
  Filled 2017-06-09 (×3): qty 2

## 2017-06-09 MED ORDER — ATORVASTATIN CALCIUM 20 MG PO TABS
40.0000 mg | ORAL_TABLET | Freq: Every day | ORAL | Status: DC
  Administered 2017-06-09: 18:00:00 40 mg via ORAL
  Filled 2017-06-09: qty 2

## 2017-06-09 MED ORDER — LEVETIRACETAM 250 MG PO TABS
250.0000 mg | ORAL_TABLET | Freq: Two times a day (BID) | ORAL | Status: DC
  Administered 2017-06-09 – 2017-06-10 (×2): 250 mg via ORAL
  Filled 2017-06-09 (×3): qty 1

## 2017-06-09 MED ORDER — ACETAMINOPHEN 325 MG PO TABS: 650.0000 mg | ORAL_TABLET | Freq: Four times a day (QID) | ORAL | Status: DC | PRN

## 2017-06-09 MED ORDER — DOCUSATE SODIUM 100 MG PO CAPS: 100.0000 mg | ORAL_CAPSULE | Freq: Two times a day (BID) | ORAL | Status: DC | PRN

## 2017-06-09 MED ORDER — INSULIN ASPART 100 UNIT/ML ~~LOC~~ SOLN
0.0000 [IU] | Freq: Three times a day (TID) | SUBCUTANEOUS | Status: DC
  Administered 2017-06-10: 2 [IU] via SUBCUTANEOUS
  Filled 2017-06-09: qty 1

## 2017-06-09 MED ORDER — ASPIRIN EC 81 MG PO TBEC
81.0000 mg | DELAYED_RELEASE_TABLET | Freq: Every day | ORAL | Status: DC
  Administered 2017-06-10: 81 mg via ORAL
  Filled 2017-06-09: qty 1

## 2017-06-09 MED ORDER — COLCHICINE 0.6 MG PO TABS
0.6000 mg | ORAL_TABLET | Freq: Two times a day (BID) | ORAL | Status: DC
  Administered 2017-06-09 – 2017-06-10 (×2): 0.6 mg via ORAL
  Filled 2017-06-09 (×3): qty 1

## 2017-06-09 MED ORDER — POLYETHYLENE GLYCOL 3350 17 G PO PACK
17.0000 g | PACK | Freq: Every day | ORAL | Status: DC | PRN
Start: 2017-06-09 — End: 2017-06-10

## 2017-06-09 MED ORDER — HEPARIN SODIUM (PORCINE) 5000 UNIT/ML IJ SOLN
5000.0000 [IU] | Freq: Three times a day (TID) | INTRAMUSCULAR | Status: DC
  Administered 2017-06-09 – 2017-06-10 (×3): 5000 [IU] via SUBCUTANEOUS
  Filled 2017-06-09 (×3): qty 1

## 2017-06-09 MED ORDER — CLOPIDOGREL BISULFATE 75 MG PO TABS
75.0000 mg | ORAL_TABLET | Freq: Every day | ORAL | Status: DC
  Administered 2017-06-10: 75 mg via ORAL
  Filled 2017-06-09: qty 1

## 2017-06-09 MED ORDER — OXYBUTYNIN CHLORIDE 5 MG PO TABS
5.0000 mg | ORAL_TABLET | Freq: Two times a day (BID) | ORAL | Status: DC
  Administered 2017-06-09 – 2017-06-10 (×2): 5 mg via ORAL
  Filled 2017-06-09 (×2): qty 1

## 2017-06-09 MED ORDER — HYDROCHLOROTHIAZIDE 25 MG PO TABS
25.0000 mg | ORAL_TABLET | Freq: Every day | ORAL | Status: DC
  Administered 2017-06-10: 10:00:00 25 mg via ORAL
  Filled 2017-06-09: qty 1

## 2017-06-09 NOTE — ED Notes (Signed)
Notified by lab of hemolysis of Green tube for CMP. Phlebotomist to come to room for 3rd draw

## 2017-06-09 NOTE — H&P (Signed)
[employer] - [place] at [facility]   PATIENT NAME: Emily [name]    MR#:  [id number]  DATE OF BIRTH:  1945-12-22  DATE OF ADMISSION:  06/09/2017  PRIMARY CARE PHYSICIAN: Emily Arbour, MD   REQUESTING/REFERRING PHYSICIAN: malinda  CHIEF COMPLAINT:   Chief Complaint  Patient presents with  . Altered Mental Status    HISTORY OF PRESENT ILLNESS: Emily [name]  is a 71 y.o. female with a known history of Htn, Gout, Stroke, DM- in NH since stroke 1 year ago. Was in hospital last week for gout on wrist, also MRI done, no new stroke Last week. Today morning- she was not able to talk with her nurse, was alert, able to understand, but could not express herself- lasted for 30 min. No headache or focal symptoms. Now back to baseline, also noted to have HR in 40 in ER.  PAST MEDICAL HISTORY:   Past Medical History:  Diagnosis Date  . Essential hypertension, benign   . Gout, unspecified   . Mixed hyperlipidemia   . Stroke [facility])    march 2017  . Syncope and collapse   . Type II or unspecified type diabetes mellitus without mention of complication, uncontrolled     PAST SURGICAL HISTORY: Past Surgical History:  Procedure Laterality Date  . ABDOMINAL HYSTERECTOMY    . TEE WITHOUT CARDIOVERSION N/A 12/26/2015   Procedure: TRANSESOPHAGEAL ECHOCARDIOGRAM (TEE);  Surgeon: Emily Heading, MD;  Location: [facility] ORS;  Service: Cardiovascular;  Laterality: N/A;  . TONSILLECTOMY AND ADENOIDECTOMY      SOCIAL HISTORY:  Social History  Substance Use Topics  . Smoking status: Former Games developer  . Smokeless tobacco: Not on file  . Alcohol use Yes     Comment: socially    FAMILY HISTORY:  Family History  Problem Relation Age of Onset  . Heart disease Mother   . Heart attack Mother   . Hypertension Mother   . Heart disease Father   . Heart attack Father   . Hypertension Father   . Heart disease Brother   . Stroke Brother   . Heart attack Other     DRUG  ALLERGIES:  Allergies  Allergen Reactions  . Enalapril   . Prednisone Palpitations    REVIEW OF SYSTEMS:   CONSTITUTIONAL: No fever, fatigue or weakness.  EYES: No blurred or double vision.  EARS, NOSE, AND THROAT: No tinnitus or ear pain.  RESPIRATORY: No cough, shortness of breath, wheezing or hemoptysis.  CARDIOVASCULAR: No chest pain, orthopnea, edema.  GASTROINTESTINAL: No nausea, vomiting, diarrhea or abdominal pain.  GENITOURINARY: No dysuria, hematuria.  ENDOCRINE: No polyuria, nocturia,  HEMATOLOGY: No anemia, easy bruising or bleeding SKIN: No rash or lesion. MUSCULOSKELETAL: No joint pain or arthritis.   NEUROLOGIC: No tingling, numbness, weakness. Was unable to speak earlier in morning. PSYCHIATRY: No anxiety or depression.   MEDICATIONS AT HOME:  Prior to Admission medications   Medication Sig Start Date End Date Taking? Authorizing Provider  acetaminophen (TYLENOL) 325 MG tablet Take 650 mg by mouth every 6 (six) hours as needed.   Yes [provider]  aspirin EC 81 MG tablet Take 81 mg by mouth daily.   Yes [provider]  atorvastatin (LIPITOR) 40 MG tablet Take 1 tablet (40 mg total) by mouth daily at 6 PM. Patient taking differently: Take 40 mg by mouth every morning.  12/23/15  Yes Shaune Pollack, MD  clopidogrel (PLAVIX) 75 MG tablet Take 1 tablet (75 mg total) by  mouth daily. 12/23/15  Yes Shaune Pollack, MD  colchicine 0.6 MG tablet Take 1 tablet (0.6 mg total) by mouth 2 (two) times daily. 06/04/17  Yes Auburn Bilberry, MD  diltiazem (CARDIZEM) 90 MG tablet Take 90 mg by mouth 2 (two) times daily.   Yes [provider]  hydrochlorothiazide (HYDRODIURIL) 25 MG tablet Take 25 mg by mouth daily.   Yes [provider]  levETIRAcetam (KEPPRA) 250 MG tablet Take 250 mg by mouth 2 (two) times daily.   Yes [provider]  metFORMIN (GLUCOPHAGE) 1000 MG tablet Take 1 tablet (1,000 mg total) by mouth 2 (two) times daily with a meal.  12/28/15  Yes Delfino Lovett, MD  oxybutynin (DITROPAN) 5 MG tablet Take 5 mg by mouth 2 (two) times daily.   Yes [provider]  polyethylene glycol (MIRALAX / GLYCOLAX) packet Take 17 g by mouth daily. Patient taking differently: Take 17 g by mouth daily as needed for moderate constipation.  12/26/15  Yes Delfino Lovett, MD  aspirin EC 325 MG tablet Take 1 tablet (325 mg total) by mouth daily. Patient not taking: Reported on 06/03/2017 12/26/15   Delfino Lovett, MD  insulin glargine (LANTUS) 100 UNIT/ML injection Inject 0.12 mLs (12 Units total) into the skin at bedtime. Patient not taking: Reported on 06/03/2017 12/26/15   Delfino Lovett, MD  predniSONE (DELTASONE) 50 MG tablet Take for 3 days then stop Patient not taking: Reported on 06/09/2017 06/05/17   Auburn Bilberry, MD      PHYSICAL EXAMINATION:   VITAL SIGNS: Blood pressure 118/73, pulse (!) 47, temperature 98 F (36.7 C), temperature source Oral, resp. rate 19, height  (1.575 m), weight 90.3 kg (199 lb), SpO2 99 %.  GENERAL:  71 y.o.-year-old patient lying in the bed with no acute distress.  EYES: Pupils equal, round, reactive to light and accommodation. No scleral icterus. Extraocular muscles intact.  HEENT: Head atraumatic, normocephalic. Oropharynx and nasopharynx clear.  NECK:  Supple, no jugular venous distention. No thyroid enlargement, no tenderness.  LUNGS: Normal breath sounds bilaterally, no wheezing, rales,rhonchi or crepitation. No use of accessory muscles of respiration.  CARDIOVASCULAR: S1, S2 normal. No murmurs, rubs, or gallops.  ABDOMEN: Soft, nontender, nondistended. Bowel sounds present. No organomegaly or mass.  EXTREMITIES: No pedal edema, cyanosis, or clubbing.  NEUROLOGIC: Cranial nerves II through XII are intact. Muscle strength 3-4/5 in all extremities. Sensation intact. Gait not checked.  PSYCHIATRIC: The patient is alert and oriented x 3.  SKIN: No obvious rash, lesion, or ulcer.   LABORATORY PANEL:    CBC  Recent Labs Lab 06/03/17 0941 06/04/17 0354 06/09/17 1110  WBC 9.0 6.8 8.9  HGB 13.4 13.1 14.7  HCT 38.6 37.7 42.5  PLT 215 213 290  MCV 84.2 83.6 85.3  MCH 29.2 29.1 29.4  MCHC 34.7 34.8 34.5  RDW 13.3 13.3 13.6   ------------------------------------------------------------------------------------------------------------------  Chemistries   Recent Labs Lab 06/03/17 0941 06/04/17 0354 06/09/17 1305  NA 139 140 138  K 3.5 3.6 3.2*  CL 104 107 101  CO2 GLUCOSE 136* 186* 121*  BUN 27* 21* 28*  CREATININE 0.86 0.59 1.08*  CALCIUM 9.9 9.0 9.9  AST 18  --  29  ALT 14  --  31  ALKPHOS 70  --  62  BILITOT 0.9  --  0.6   ------------------------------------------------------------------------------------------------------------------ estimated creatinine clearance is 52.1 mL/min (A) (by C-G formula based on SCr of 1.08 mg/dL (H)). ------------------------------------------------------------------------------------------------------------------ No results for  input(s): TSH, T4TOTAL, T3FREE, THYROIDAB in the last 72 hours.  Invalid input(s): FREET3   Coagulation profile No results for input(s): INR, PROTIME in the last 168 hours. ------------------------------------------------------------------------------------------------------------------- No results for input(s): DDIMER in the last 72 hours. -------------------------------------------------------------------------------------------------------------------  Cardiac Enzymes  Recent Labs Lab 06/03/17 0941 06/09/17 1305  TROPONINI <0.03 <0.03   ------------------------------------------------------------------------------------------------------------------ Invalid input(s): POCBNP  ---------------------------------------------------------------------------------------------------------------  Urinalysis    Component Value Date/Time   COLORURINE YELLOW (A) 06/03/2017 0941   APPEARANCEUR  CLEAR (A) 06/03/2017 0941   LABSPEC 1.021 06/03/2017 0941   PHURINE 5.0 06/03/2017 0941   GLUCOSEU NEGATIVE 06/03/2017 0941   HGBUR NEGATIVE 06/03/2017 0941   BILIRUBINUR NEGATIVE 06/03/2017 0941   KETONESUR NEGATIVE 06/03/2017 0941   PROTEINUR NEGATIVE 06/03/2017 0941   NITRITE NEGATIVE 06/03/2017 0941   LEUKOCYTESUR NEGATIVE 06/03/2017 0941     RADIOLOGY: Ct Head Wo Contrast  Result Date: 06/09/2017 CLINICAL DATA:  Altered level of consciousness. Unresponsive while bleeding breakfast EXAM: CT HEAD WITHOUT CONTRAST TECHNIQUE: Contiguous axial images were obtained from the base of the skull through the vertex without intravenous contrast. COMPARISON:  MR brain 06/03/2017 FINDINGS: Brain: No evidence of acute infarction, hemorrhage, extra-axial collection, ventriculomegaly, or mass effect. Old left basal ganglia lacunar infarct. Generalized cerebral atrophy. Periventricular white matter low attenuation likely secondary to microangiopathy. Vascular: Cerebrovascular atherosclerotic calcifications are noted. Skull: Negative for fracture or focal lesion. Sinuses/Orbits: Visualized portions of the orbits are unremarkable. Visualized portions of the paranasal sinuses and mastoid air cells are unremarkable. Other: None. IMPRESSION: 1. No acute intracranial pathology. Electronically Signed   By: Elige Ko   On: 06/09/2017 12:54   Dg Chest Portable 1 View  Result Date: 06/09/2017 CLINICAL DATA:  Pt presents to ED from group home [address] via [facility] for episode of unresponsiveness while eating breakfast. When EMS arrived pt was opening eyes but confusedPt had a seizure several weeks ago EXAM: PORTABLE CHEST 1 VIEW COMPARISON:  06/03/2017 FINDINGS: Cardiac silhouette is normal in size. No mediastinal or hilar masses. No evidence of adenopathy. Prominent bronchovascular markings, stable. No evidence of pneumonia or pulmonary edema. No pleural effusion or pneumothorax. Skeletal structures are  demineralized but grossly intact. IMPRESSION: No active disease. Electronically Signed   By: Amie Portland M.D.   On: 06/09/2017 13:01    EKG: Orders placed or performed during the hospital encounter of 06/09/17  . EKG 12-Lead  . EKG 12-Lead  . ED EKG  . ED EKG    IMPRESSION AND PLAN:  * Aphasia- transient   May be TIA   Or due to bradycardia     Monitor on tele, MRI, Echo, carotid doppler.   Neuro checks, HBA1c, Lipid panel, neuro consult    PT eval  * bradycardia   Hold CA ch blocker  * DM   Cont home meds, Keep on ISS.  * Htn    Cont HCTz  * GOut    Last week flare up   Finished steroids, no pain now, cont colchicine;  All the records are reviewed and case discussed with ED provider. Management plans discussed with the patient, family and they are in agreement.  CODE STATUS: Full. Code Status History    Date Active Date Inactive Code Status Order ID Comments User Context   06/03/2017  4:11 PM 06/04/2017  5:49 PM Full Code [id number]  Enid Baas, MD Inpatient   12/21/2015 11:48 PM 12/26/2015  8:28 PM Full Code [id number]  Houston Siren, MD Inpatient  TOTAL TIME TAKING CARE OF THIS PATIENT: 45 minutes.    Altamese Dilling M.D on 06/09/2017   Between 7am to 6pm - Pager - [phone]  After 6pm go to [email or web address] - password [email or web address]  [facility]  Office  [phone]  CC: Primary care physician; Emily Arbour, MD   Note: This dictation was prepared with Dragon dictation along with smaller phrase technology. Any transcriptional errors that result from this process are unintentional.

## 2017-06-09 NOTE — ED Triage Notes (Signed)
Pt presents to ED from group home [address] via [facility] for episode of unresponsiveness while eating breakfast. When EMS arrived pt was opening eyes but confused. Pt is spitting up scrambled eggs. EMS reports getting eggs out of pt mouth.   EMS reports pt was sinus brady in 30's-40's. BP 98/60, CBG 172, oxygen sats good. EMS reports once pt was in truck she was more alert and responsive. Pt is answering questions appropriately but doesn't remember what happened.   Pt states seizure a few weeks ago. EMS reports pt takes keppra, Cardizem, HCTZ. Pt was recently seen for gout and finished prednisone 2 days ago.

## 2017-06-09 NOTE — ED Provider Notes (Signed)
[facility] Emergency Department Provider Note   ____________________________________________   First MD Initiated Contact with Patient 06/09/17 1137     (approximate)  I have reviewed the triage vital signs and the nursing notes.   HISTORY  Chief Complaint Altered Mental Status    HPI Emily [name] is a 71 y.o. female It was at the group home today and passed out while eating  EMS reports she was opening her She had a mouthful of scrambled e Patient does not remember anythin bradycardia in the  With a low blood pressure 98/60. C In the emergency room patient is  She reports that she did wake up in the back area ambul okay now does not know what happens does not any chest pain shortness of breath   Past Medical History:  Diagnosis Date  . Essential hypertension, benign   . Gout, unspecified   . Mixed hyperlipidemia   . Stroke [facility])    march 2017  . Syncope and collapse   . Type II or unspecified type diabetes mellitus without mention of complication, uncontrolled     Patient Active Problem List   Diagnosis Date Noted  . Gouty arthritis 06/03/2017  . Acute CVA (cerebrovascular accident) (HCC) 12/22/2015  . Syncope 12/21/2015  . Hyperlipidemia 06/23/2013  . DM (diabetes mellitus) (HCC) 06/23/2013  . HTN (hypertension) 06/23/2013  . Morbid obesity (HCC) 06/23/2013  . Abnormal ECG 06/23/2013    Past Surgical History:  Procedure Laterality Date  . ABDOMINAL HYSTERECTOMY    . TEE WITHOUT CARDIOVERSION N/A 12/26/2015   Procedure: TRANSESOPHAGEAL ECHOCARDIOGRAM (TEE);  Surgeon: Dalia Heading, MD;  Location: [facility] ORS;  Service: Cardiovascular;  Laterality: N/A;  . TONSILLECTOMY AND ADENOIDECTOMY      Prior to Admission medications   Medication Sig Start Date End Date Taking? Authorizing Provider  acetaminophen (TYLENOL) 325 MG tablet Take 650 mg by mouth every 6 (six) hours as needed.   Yes [provider]  aspirin EC 81 MG tablet  Take 81 mg by mouth daily.   Yes [provider]  atorvastatin (LIPITOR) 40 MG tablet Take 1 tablet (40 mg total) by mouth daily at 6 PM. Patient taking differently: Take 40 mg by mouth every morning.  12/23/15  Yes Shaune Pollack, MD  clopidogrel (PLAVIX) 75 MG tablet Take 1 tablet (75 mg total) by mouth daily. 12/23/15  Yes Shaune Pollack, MD  colchicine 0.6 MG tablet Take 1 tablet (0.6 mg total) by mouth 2 (two) times daily. 06/04/17  Yes Auburn Bilberry, MD  diltiazem (CARDIZEM) 90 MG tablet Take 90 mg by mouth 2 (two) times daily.   Yes [provider]  hydrochlorothiazide (HYDRODIURIL) 25 MG tablet Take 25 mg by mouth daily.   Yes [provider]  levETIRAcetam (KEPPRA) 250 MG tablet Take 250 mg by mouth 2 (two) times daily.   Yes [provider]  metFORMIN (GLUCOPHAGE) 1000 MG tablet Take 1 tablet (1,000 mg total) by mouth 2 (two) times daily with a meal. 12/28/15  Yes Delfino Lovett, MD  oxybutynin (DITROPAN) 5 MG tablet Take 5 mg by mouth 2 (two) times daily.   Yes [provider]  polyethylene glycol (MIRALAX / GLYCOLAX) packet Take 17 g by mouth daily. Patient taking differently: Take 17 g by mouth daily as needed for moderate constipation.  12/26/15  Yes Delfino Lovett, MD  aspirin EC 325 MG tablet Take 1 tablet (325 mg total) by mouth daily. Patient not taking: Reported on 06/03/2017  12/26/15   Delfino Lovett, MD  insulin glargine (LANTUS) 100 UNIT/ML injection Inject 0.12 mLs (12 Units total) into the skin at bedtime. Patient not taking: Reported on 06/03/2017 12/26/15   Delfino Lovett, MD  predniSONE (DELTASONE) 50 MG tablet Take for 3 days then stop Patient not taking: Reported on 06/09/2017 06/05/17   Auburn Bilberry, MD    Allergies Enalapril and Prednisone  Family History  Problem Relation Age of Onset  . Heart disease Mother   . Heart attack Mother   . Hypertension Mother   . Heart disease Father   . Heart attack Father   . Hypertension Father   . Heart  disease Brother   . Stroke Brother   . Heart attack Other     Social History Social History  Substance Use Topics  . Smoking status: Former Games developer  . Smokeless tobacco: Not on file  . Alcohol use Yes     Comment: socially    Review of Systems  Constitutional: No fever/chills Eyes: No visual changes. ENT: No sore throat. Cardiovascular: Denies chest pain. Respiratory: Denies shortness of breath. Gastrointestinal: No abdominal pain.  No nausea, no vomiting.  No diarrhea.  No constipation. Genitourinary: Negative for dysuria. Musculoskeletal: Negative for back pain. Skin: Negative for rash. Neurological: Negative for headaches, focal weakness  ____________________________________________   PHYSICAL EXAM:  VITAL SIGNS: ED Triage Vitals  Enc Vitals Group     BP 06/09/17 1113 112/63     Pulse Rate 06/09/17 1113 (!) 54     Resp 06/09/17 1113 (!) 22     Temp 06/09/17 1113 98 F (36.7 C)     Temp Source 06/09/17 1113 Oral     SpO2 06/09/17 1113 98 %     Weight 06/09/17 1115 199 lb (90.3 kg)     Height 06/09/17 1115  (1.575 m)     Head Circumference --      Peak Flow --      Pain Score 06/09/17 1113 0     Pain Loc --      Pain Edu? --      Excl. in GC? --     Constitutional: Alert and oriented. Well appearing and in no acute distress. Eyes: Conjunctivae are normal. PERRL. EOMI. Head: Atraumatic. Nose: No congestion/rhinnorhea. Mouth/Throat: Mucous membranes are moist.  Oropharynx non-erythematous. Neck: No stridor.  Cardiovascular: Normal rate, regular rhythm. Grossly normal heart sounds.  Good peripheral circulation. Respiratory: Normal respiratory effort.  No retractions. Lungs CTAB. Gastrointestinal: Soft and nontender. No distention. No abdominal bruits. No CVA tenderness. Musculoskeletal: No lower extremity tenderness nor edema.  No joint effusions. Neurologic:  Normal speech and language. No newgross focal neurologic deficits are appreciated. Skin:  Skin  is warm, dry and intact. No rash noted. Psychiatric: Mood and affect are normal. Speech and behavior are normal.  ____________________________________________   LABS (all labs ordered are listed, but only abnormal results are displayed)  Labs Reviewed  COMPREHENSIVE METABOLIC PANEL - Abnormal; Notable for the following:       Result Value   Potassium 3.2 (*)    Glucose, Bld 121 (*)    BUN 28 (*)    Creatinine, Ser 1.08 (*)    Albumin 3.4 (*)    GFR calc non Af Amer 52 (*)    GFR calc Af Amer 60 (*)    All other components within normal limits  CBC  TROPONIN I  CBG MONITORING, ED   ____________________________________________  EKG  EKG read and  interpreted by me  Shows sinus bradycardia at 55 left axis  ____________________________________________  RADIOLOGY  IMPRESSION: 1. No acute intracranial pathology.   Electronically Signed   By: Elige Ko   On: 06/09/2017 12:54  Chest x-ray: __IMPRESSION: No active disease.   Electronically Signed   By: Amie Portland M.D.   On: 06/09/2017 13:01 __________________________________________   PROCEDURES  Procedure(s) performed:   Procedures  Critical Care performed:   ____________________________________________   INITIAL IMPRESSION / ASSESSMENT AND PLAN / ED COURSE  Pertinent labs & imaging results that were available during my care of the patient were reviewed by me and considered in my medical decision making (see chart for details).        ____________________________________________   FINAL CLINICAL IMPRESSION(S) / ED DIAGNOSES  Final diagnoses:  Syncope and collapse  Bradycardia      NEW MEDICATIONS STARTED DURING THIS VISIT:  New Prescriptions   No medications on file     Note:  This document was prepared using Dragon voice recognition software and may include unintentional dictation errors.    Arnaldo Natal, MD 06/09/17 1359

## 2017-06-09 NOTE — ED Notes (Signed)
Pt to CT

## 2017-06-10 ENCOUNTER — Observation Stay: Payer: Medicare Other

## 2017-06-10 DIAGNOSIS — R55 Syncope and collapse: Secondary | ICD-10-CM | POA: Diagnosis not present

## 2017-06-10 LAB — CBC
HEMATOCRIT: 41.5 % (ref 35.0–47.0)
Hemoglobin: 14.2 g/dL (ref 12.0–16.0)
MCH: 29.1 pg (ref 26.0–34.0)
MCHC: 34.2 g/dL (ref 32.0–36.0)
MCV: 85.2 fL (ref 80.0–100.0)
Platelets: 247 10*3/uL (ref 150–440)
RBC: 4.88 MIL/uL (ref 3.80–5.20)
RDW: 13.1 % (ref 11.5–14.5)
WBC: 7.8 10*3/uL (ref 3.6–11.0)

## 2017-06-10 LAB — BASIC METABOLIC PANEL
Anion gap: 10 (ref 5–15)
BUN: 25 mg/dL — AB (ref 6–20)
CHLORIDE: 102 mmol/L (ref 101–111)
CO2: 28 mmol/L (ref 22–32)
Calcium: 9.5 mg/dL (ref 8.9–10.3)
Creatinine, Ser: 0.94 mg/dL (ref 0.44–1.00)
GFR calc Af Amer: >60 mL/min (ref >60)
GFR calc non Af Amer: >60 mL/min (ref >60)
Glucose, Bld: 101 mg/dL — ABNORMAL HIGH (ref 65–99)
POTASSIUM: 3.1 mmol/L — AB (ref 3.5–5.1)
SODIUM: 140 mmol/L (ref 135–145)

## 2017-06-10 LAB — HEMOGLOBIN A1C
Hgb A1c MFr Bld: 5.9 % — ABNORMAL HIGH (ref 4.8–5.6)
Mean Plasma Glucose: 122.63 mg/dL

## 2017-06-10 LAB — GLUCOSE, CAPILLARY
Glucose-Capillary: 92 mg/dL (ref 65–99)
Glucose-Capillary: 169 mg/dL — ABNORMAL HIGH (ref 65–99)

## 2017-06-10 MED ORDER — POTASSIUM CHLORIDE CRYS ER 20 MEQ PO TBCR: 20.0000 meq | EXTENDED_RELEASE_TABLET | Freq: Every day | ORAL | 0 refills | Status: AC

## 2017-06-10 MED ORDER — POTASSIUM CHLORIDE CRYS ER 20 MEQ PO TBCR: 40.0000 meq | EXTENDED_RELEASE_TABLET | Freq: Once | ORAL | Status: DC

## 2017-06-10 NOTE — Plan of Care (Signed)
Problem: Education: Goal: Knowledge of [facility] Education information/materials will improve Outcome: Progressing VSS, free of falls during shift.  Denies pain.  No needs overnight.  Ambulated to bathroom x1 assist.  Bed in low position, bed alarm on.  Call bell within reach, WCTM.

## 2017-06-10 NOTE — Consult Note (Signed)
Reason for Consult:Difficulty with speech Referring Physician: Sudini  CC: Difficulty with speech  HPI: Emily [name] is an 71 y.o. female with a history of stroke who by her report and admitting H&P was talking with her nurse when she became unable to respond.  She was able to understand and was alert.  This lasted for about 30 minutes.  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   It should be noted that per the EDP note, the patient also had a syncopal episode while eating.  Had low BP as well.  Consult requested for further recommendations   Past Medical History:  Diagnosis Date  . Essential hypertension, benign   . Gout, unspecified   . Mixed hyperlipidemia   . Stroke [facility])    march 2017  . Syncope and collapse   . Type II or unspecified type diabetes mellitus without mention of complication, uncontrolled     Past Surgical History:  Procedure Laterality Date  . ABDOMINAL HYSTERECTOMY    . TEE WITHOUT CARDIOVERSION N/A 12/26/2015   Procedure: TRANSESOPHAGEAL ECHOCARDIOGRAM (TEE);  Surgeon: Dalia Heading, MD;  Location: [facility] ORS;  Service: Cardiovascular;  Laterality: N/A;  . TONSILLECTOMY AND ADENOIDECTOMY      Family History  Problem Relation Age of Onset  . Heart disease Mother   . Heart attack Mother   . Hypertension Mother   . Heart disease Father   . Heart attack Father   . Hypertension Father   . Heart disease Brother   . Stroke Brother   . Heart attack Other     Social History:  reports that she has quit smoking. She has never used smokeless tobacco. She reports that she drinks alcohol. She reports that she does not use drugs.  Allergies  Allergen Reactions  . Enalapril   . Prednisone Palpitations    Medications:  I have reviewed the patient's current medications. Prior to Admission:  Prescriptions Prior to Admission  Medication Sig Dispense Refill Last Dose  .  acetaminophen (TYLENOL) 325 MG tablet Take 650 mg by mouth every 6 (six) hours as needed.   PRN at PRN  . aspirin EC 81 MG tablet Take 81 mg by mouth daily.   06/08/2017 at 0800  . atorvastatin (LIPITOR) 40 MG tablet Take 1 tablet (40 mg total) by mouth daily at 6 PM. (Patient taking differently: Take 40 mg by mouth every morning. ) 30 tablet 2 06/08/2017 at 0800  . clopidogrel (PLAVIX) 75 MG tablet Take 1 tablet (75 mg total) by mouth daily. 30 tablet 2 06/08/2017 at 0800  . colchicine 0.6 MG tablet Take 1 tablet (0.6 mg total) by mouth 2 (two) times daily. 20 tablet 0 06/08/2017 at 0800  . diltiazem (CARDIZEM) 90 MG tablet Take 90 mg by mouth 2 (two) times daily.   06/08/2017 at 0800  . hydrochlorothiazide (HYDRODIURIL) 25 MG tablet Take 25 mg by mouth daily.   06/08/2017 at 0800  . levETIRAcetam (KEPPRA) 250 MG tablet Take 250 mg by mouth 2 (two) times daily.   06/08/2017 at 0800  . metFORMIN (GLUCOPHAGE) 1000 MG tablet Take 1 tablet (1,000 mg total) by mouth 2 (two) times daily with a meal. 60 tablet 0 06/08/2017 at 0800  . oxybutynin (DITROPAN) 5 MG tablet Take 5 mg by mouth 2 (two) times daily.   06/08/2017 at 0800  . polyethylene glycol (MIRALAX / GLYCOLAX) packet Take 17 g  by mouth daily. (Patient taking differently: Take 17 g by mouth daily as needed for moderate constipation. ) 14 each 0 PRN at PRN  . aspirin EC 325 MG tablet Take 1 tablet (325 mg total) by mouth daily. (Patient not taking: Reported on 06/03/2017) 30 tablet 0 Not Taking at Unknown time  . insulin glargine (LANTUS) 100 UNIT/ML injection Inject 0.12 mLs (12 Units total) into the skin at bedtime. (Patient not taking: Reported on 06/03/2017) 10 mL 11 Not Taking at Unknown time  . predniSONE (DELTASONE) 50 MG tablet Take for 3 days then stop (Patient not taking: Reported on 06/09/2017) 3 tablet 0 Completed Course at Unknown time   Scheduled: . aspirin EC  81 mg Oral Daily  . atorvastatin  40 mg Oral q1800  . clopidogrel  75 mg Oral Daily   . colchicine  0.6 mg Oral BID  . heparin  5,000 Units Subcutaneous Q8H  . hydrochlorothiazide  25 mg Oral Daily  . insulin aspart  0-9 Units Subcutaneous TID WC  . levETIRAcetam  250 mg Oral BID  . metFORMIN  1,000 mg Oral BID WC  . oxybutynin  5 mg Oral BID    ROS: History obtained from the patient  General ROS: negative for - chills, fatigue, fever, night sweats, weight gain or weight loss Psychological ROS: negative for - behavioral disorder, hallucinations, memory difficulties, mood swings or suicidal ideation Ophthalmic ROS: negative for - blurry vision, double vision, eye pain or loss of vision ENT ROS: negative for - epistaxis, nasal discharge, oral lesions, sore throat, tinnitus or vertigo Allergy and Immunology ROS: negative for - hives or itchy/watery eyes Hematological and Lymphatic ROS: negative for - bleeding problems, bruising or swollen lymph nodes Endocrine ROS: negative for - galactorrhea, hair pattern changes, polydipsia/polyuria or temperature intolerance Respiratory ROS: negative for - cough, hemoptysis, shortness of breath or wheezing Cardiovascular ROS: negative for - chest pain, dyspnea on exertion, edema or irregular heartbeat Gastrointestinal ROS: negative for - abdominal pain, diarrhea, hematemesis, nausea/vomiting or stool incontinence Genito-Urinary ROS: negative for - dysuria, hematuria, incontinence or urinary frequency/urgency Musculoskeletal ROS: negative for - joint swelling or muscular weakness Neurological ROS: as noted in HPI Dermatological ROS: negative for rash and skin lesion changes  Physical Examination: Blood pressure (!) 154/64, pulse 64, temperature (!) 97.5 F (36.4 C), temperature source Oral, resp. rate 20, height 5\' 2"  (1.575 m), weight 90.7 kg (199 lb 14.4 oz), SpO2 100 %.  HEENT-  Normocephalic, no lesions, without obvious abnormality.  Normal external eye and conjunctiva.  Normal TM's bilaterally.  Normal auditory canals and external  ears. Normal external nose, mucus membranes and septum.  Normal pharynx. Cardiovascular- S1, S2 normal, pulses palpable throughout   Lungs- chest clear, no wheezing, rales, normal symmetric air entry Abdomen- soft, non-tender; bowel sounds normal; no masses,  no organomegaly Extremities- no edema Lymph-no adenopathy palpable Musculoskeletal-no joint tenderness, deformity or swelling Skin-warm and dry, no hyperpigmentation, vitiligo, or suspicious lesions  Neurological Examination   Mental Status: Alert, oriented, thought content appropriate.  Patient able to repeat but has some difficulty naming objects.  Able to follow 3 step commands without difficulty. Cranial Nerves: II: Discs flat bilaterally; Visual fields grossly normal, pupils equal, round, reactive to light and accommodation III,IV, VI: ptosis not present, extra-ocular motions intact bilaterally V,VII: mild left facial droop, facial light touch sensation normal bilaterally VIII: hearing normal bilaterally IX,X: gag reflex present XI: bilateral shoulder shrug XII: midline tongue extension Motor: Right : Upper extremity  5-/5    Left:     Upper extremity   5-/5  Lower extremity   5/5     Lower extremity   5/5 Tone and bulk:normal tone throughout; no atrophy noted Sensory: Pinprick and light touch intact throughout, bilaterally Deep Tendon Reflexes: 2+ in the upper extremities and absent in the lower extremities Plantars: Right: downgoing   Left: equivocal Cerebellar: normal finger-to-nose, normal rapid alternating movements and normal heel-to-shin test Gait: not tested due to safety concerns      Laboratory Studies:   Basic Metabolic Panel:  Recent Labs Lab 06/04/17 0354 06/09/17 1305 06/10/17 0626  NA 140 138 140  K 3.6 3.2* 3.1*  CL 107 101 102  CO2 GLUCOSE 186* 121* 101*  BUN 21* 28* 25*  CREATININE 0.59 1.08* 0.94  CALCIUM 9.0 9.9 9.5    Liver Function Tests:  Recent Labs Lab  06/09/17 1305  AST 29  ALT 31  ALKPHOS 62  BILITOT 0.6  PROT 6.9  ALBUMIN 3.4*   No results for input(s): LIPASE, AMYLASE in the last 168 hours. No results for input(s): AMMONIA in the last 168 hours.  CBC:  Recent Labs Lab 06/04/17 0354 06/09/17 1110 06/10/17 0626  WBC 6.8 8.9 7.8  HGB 13.1 14.7 14.2  HCT 37.7 42.5 41.5  MCV 83.6 85.3 85.2  PLT 213 290 247    Cardiac Enzymes:  Recent Labs Lab 06/09/17 1305  TROPONINI <0.03    BNP: Invalid input(s): POCBNP  CBG:  Recent Labs Lab 06/04/17 0752 06/04/17 1149 06/09/17 1650 06/09/17 2137 06/10/17 0750  GLUCAP 151* 145* 93 91 92    Microbiology: Results for orders placed or performed during the hospital encounter of 01/22/16  Urine culture     Status: Abnormal   Collection Time: 02/08/16  4:30 AM  Result Value Ref Range Status   Specimen Description URINE, RANDOM  Final   Special Requests NONE  Final   Culture MULTIPLE SPECIES PRESENT, SUGGEST RECOLLECTION (A)  Final   Report Status 02/09/2016 FINAL  Final  Urine culture     Status: Abnormal   Collection Time: 02/10/16  7:30 AM  Result Value Ref Range Status   Specimen Description URINE, CLEAN CATCH  Final   Special Requests NONE  Final   Culture >=100,000 COLONIES/mL ESCHERICHIA COLI (A)  Final   Report Status 02/12/2016 FINAL  Final   Organism ID, Bacteria ESCHERICHIA COLI (A)  Final      Susceptibility   Escherichia coli - MIC*    AMPICILLIN >=32 RESISTANT Resistant     CEFAZOLIN <=4 SENSITIVE Sensitive     CEFTRIAXONE <=1 SENSITIVE Sensitive     CIPROFLOXACIN <=0.25 SENSITIVE Sensitive     GENTAMICIN >=16 RESISTANT Resistant     IMIPENEM <=0.25 SENSITIVE Sensitive     NITROFURANTOIN <=16 SENSITIVE Sensitive     TRIMETH/SULFA <=20 SENSITIVE Sensitive     AMPICILLIN/SULBACTAM 16 INTERMEDIATE Intermediate     PIP/TAZO <=4 SENSITIVE Sensitive     Extended ESBL NEGATIVE Sensitive     * >=100,000 COLONIES/mL ESCHERICHIA COLI    Coagulation  Studies: No results for input(s): LABPROT, INR in the last 72 hours.  Urinalysis: No results for input(s): COLORURINE, LABSPEC, PHURINE, GLUCOSEU, HGBUR, BILIRUBINUR, KETONESUR, PROTEINUR, UROBILINOGEN, NITRITE, LEUKOCYTESUR in the last 168 hours.  Invalid input(s): APPERANCEUR  Lipid Panel:     Component Value Date/Time   CHOL 132 06/09/2017 1110   TRIG 121 06/09/2017 1110   HDL 40 (L) 06/09/2017  1110   CHOLHDL 3.3 06/09/2017 1110   VLDL 24 06/09/2017 1110   LDLCALC 68 06/09/2017 1110    HgbA1C:  Lab Results  Component Value Date   HGBA1C 5.7 (H) 06/03/2017    Urine Drug Screen:  No results found for: LABOPIA, COCAINSCRNUR, LABBENZ, AMPHETMU, THCU, LABBARB  Alcohol Level: No results for input(s): ETH in the last 168 hours.  Other results: EKG: normal sinus rhythm at 55 bpm.  Imaging: Ct Head Wo Contrast  Result Date: 06/09/2017 CLINICAL DATA:  Altered level of consciousness. Unresponsive while bleeding breakfast EXAM: CT HEAD WITHOUT CONTRAST TECHNIQUE: Contiguous axial images were obtained from the base of the skull through the vertex without intravenous contrast. COMPARISON:  MR brain 06/03/2017 FINDINGS: Brain: No evidence of acute infarction, hemorrhage, extra-axial collection, ventriculomegaly, or mass effect. Old left basal ganglia lacunar infarct. Generalized cerebral atrophy. Periventricular white matter low attenuation likely secondary to microangiopathy. Vascular: Cerebrovascular atherosclerotic calcifications are noted. Skull: Negative for fracture or focal lesion. Sinuses/Orbits: Visualized portions of the orbits are unremarkable. Visualized portions of the paranasal sinuses and mastoid air cells are unremarkable. Other: None. IMPRESSION: 1. No acute intracranial pathology. Electronically Signed   By: Elige Ko   On: 06/09/2017 12:54   Mr Brain Wo Contrast  Result Date: 06/09/2017 CLINICAL DATA:  Ataxia with stroke suspected. Bradycardia. Patient currently back at  baseline. EXAM: MRI HEAD WITHOUT CONTRAST TECHNIQUE: Multiplanar, multiecho pulse sequences of the brain and surrounding structures were obtained without intravenous contrast. COMPARISON:  06/03/2017 FINDINGS: Brain: No acute infarction, hemorrhage, hydrocephalus, extra-axial collection or mass lesion. Advanced chronic microvascular ischemia with confluent ischemic gliosis in the cerebral white matter. There is been a remote small vessel infarct in left basal ganglia and in the bilateral corona radiata. Mild generalized cerebral volume loss. Vascular: Major flow voids are patent. Skull and upper cervical spine: Negative for marrow lesion. Cervical facet arthropathy and degenerative disc narrowing. Sinuses/Orbits: Negative IMPRESSION: 1. No acute finding, including infarct. Stable from study 6 days prior. 2. Advanced chronic small vessel ischemia with remote small vessel infarcts in the left basal ganglia and bilateral corona radiata. Electronically Signed   By: Marnee Spring M.D.   On: 06/09/2017 16:29   US Carotid Bilateral  Result Date: 06/09/2017 CLINICAL DATA:  CVA, hypertension, syncope, hyperlipidemia EXAM: BILATERAL CAROTID DUPLEX ULTRASOUND TECHNIQUE: Wallace Cullens scale imaging, color Doppler and duplex ultrasound were performed of bilateral carotid and vertebral arteries in the neck. COMPARISON:  06/09/2017 head CT without contrast FINDINGS: Criteria: Quantification of carotid stenosis is based on velocity parameters that correlate the residual internal carotid diameter with NASCET-based stenosis levels, using the diameter of the distal internal carotid lumen as the denominator for stenosis measurement. The following velocity measurements were obtained: RIGHT ICA:  79/26 cm/sec CCA:  73/14 cm/sec SYSTOLIC ICA/CCA RATIO:  1.1 DIASTOLIC ICA/CCA RATIO:  2.0 ECA:  83 cm/sec LEFT ICA:  56/5 cm/sec CCA:  45/5 cm/sec SYSTOLIC ICA/CCA RATIO:  1.2 DIASTOLIC ICA/CCA RATIO:  0.9 ECA:  65 cm/sec RIGHT CAROTID ARTERY:  Mild heterogeneous scattered plaque calcification of the right carotid system. Despite this, no hemodynamically significant right ICA stenosis, velocity elevation, or turbulent flow. Degree of narrowing less than 50% by ultrasound criteria. RIGHT VERTEBRAL ARTERY:  Antegrade LEFT CAROTID ARTERY: Similar mild scattered calcified left carotid system atherosclerosis. Despite this, no hemodynamically significant left ICA stenosis, velocity elevation, or turbulent flow. LEFT VERTEBRAL ARTERY:  Antegrade IMPRESSION: Mild bilateral carotid atherosclerosis. No hemodynamically significant ICA stenosis. Degree of narrowing less than  50% by ultrasound criteria. Patent antegrade vertebral flow bilaterally Electronically Signed   By: Judie Petit.  Shick M.D.   On: 06/09/2017 16:11   Dg Chest Portable 1 View  Result Date: 06/09/2017 CLINICAL DATA:  Pt presents to ED from group home [address] via [facility] for episode of unresponsiveness while eating breakfast. When EMS arrived pt was opening eyes but confusedPt had a seizure several weeks ago EXAM: PORTABLE CHEST 1 VIEW COMPARISON:  06/03/2017 FINDINGS: Cardiac silhouette is normal in size. No mediastinal or hilar masses. No evidence of adenopathy. Prominent bronchovascular markings, stable. No evidence of pneumonia or pulmonary edema. No pleural effusion or pneumothorax. Skeletal structures are demineralized but grossly intact. IMPRESSION: No active disease. Electronically Signed   By: Amie Portland M.D.   On: 06/09/2017 13:01     Assessment/Plan: 71 year old female with a history of stroke presenting with change in mental status.  Patient is now back to baseline.  MRI of the brain reviewed and shows no acute changes.  Carotid dopplers show no evidence of hemodynamically significant stenosis.  A1c 5.7, LDL 68.  Patient on ASA and Plavix.  Unclear if presentation represents a TIA.  Can not rule out possible seizure as well with history of stroke.  Further work up recommended.     Recommendations: 1.  Agree with continuation of ASA and Plavix.   2.  EEG.  If no evidence of epileptiform activity anticonvulsant therapy not indicated at this time.    Thana Farr, MD Neurology [phone] 06/10/2017, 10:34 AM

## 2017-06-10 NOTE — Care Management Obs Status (Signed)
MEDICARE OBSERVATION STATUS NOTIFICATION   Patient Details  Name: Emily [name] MRN: [id number] Date of Birth: [birth year]   Medicare Observation Status Notification Given:  Yes    Gwenette Greet, RN 06/10/2017, 10:33 AM

## 2017-06-10 NOTE — Plan of Care (Addendum)
Nurse Tech sd that blood sugar check didn't transfer over - result was 92. Checked glucometer myself.

## 2017-06-10 NOTE — Care Management Note (Signed)
Case Management Note  Patient Details  Name: Emily [name] MRN: [id number] Date of Birth: 1945/12/16  Subjective/Objective:      Admitted to [facility] under observation status with the diagnosis of TIA. Lives at [facility] x 1 year. Sister is Annice Pih [phone]). Son is Berna Spare [phone]) Last seen Dr. Judithann Sheen 04/23/17. Discharged from this facility 06/03/17. Followed by [facility]. Seen by occupational and physical therapy from [facility] 06/05/17 and 06/06/17. Also, followed by [facility] 12/2015. No skilled facility. No home oxygen. Rolling walker at the home. Self feeds, self dresses, and self baths.     No falls. Good appetite.           Action/Plan: Physical therapy evaluation completed. Recommending to continue services in the home.     Expected Discharge Date:                  Expected Discharge Plan:     In-House Referral:     Discharge planning Services     Post Acute Care Choice:    Choice offered to:     DME Arranged:    DME Agency:     HH Arranged:    HH Agency:     Status of Service:     If discussed at [employer], dates discussed:    Additional Comments:  Gwenette Greet, RN MSN CCM Care Management [phone] 06/10/2017, 10:01 AM

## 2017-06-10 NOTE — Discharge Instructions (Signed)
Heart healthy diabetic diet ° °Activity as tolerated °

## 2017-06-10 NOTE — Discharge Summary (Signed)
[employer] - [place] [facility]   PATIENT NAME: Emily [name]    MR#:  [id number]  DATE OF BIRTH:  [birth year]  DATE OF ADMISSION:  06/09/2017 ADMITTING PHYSICIAN: Altamese Dilling, MD  DATE OF DISCHARGE: 06/10/2017  PRIMARY CARE PHYSICIAN: Marguarite Arbour, MD   ADMISSION DIAGNOSIS:  Syncope and collapse [R55] Bradycardia [R00.1] Cerebral infarction (HCC) [I63.9] CVA (cerebral infarction) [I63.9]  DISCHARGE DIAGNOSIS:  Principal Problem:   TIA (transient ischemic attack)   SECONDARY DIAGNOSIS:   Past Medical History:  Diagnosis Date  . Essential hypertension, benign   . Gout, unspecified   . Mixed hyperlipidemia   . Stroke [facility])    march 2017  . Syncope and collapse   . Type II or unspecified type diabetes mellitus without mention of complication, uncontrolled      ADMITTING HISTORY  HISTORY OF PRESENT ILLNESS: Emily [name]  is a 71 y.o. female with a known history of Htn, Gout, Stroke, DM- in NH since stroke 1 year ago. Was in hospital last week for gout on wrist, also MRI done, no new stroke Last week. Today morning- she was not able to talk with her nurse, was alert, able to understand, but could not express herself- lasted for 30 min. No headache or focal symptoms. Now back to baseline, also noted to have HR in 40 in ER.  HOSPITAL COURSE:   * TIA MRI brain normal. Also seizure and encephalopathy considered in differential and EEG was checked. Normal. Discussed with Dr. Thad Ranger. Patient is already on ASA, Plavix, Statin  Seen by PT. HH set up  Stable for discharge with home health    CONSULTS OBTAINED:  Treatment Team:  Pauletta Browns, MD Kym Groom, MD  DRUG ALLERGIES:   Allergies  Allergen Reactions  . Enalapril   . Prednisone Palpitations    DISCHARGE MEDICATIONS:   Current Discharge Medication List    START taking these medications   Details  potassium chloride SA (K-DUR,KLOR-CON) 20 MEQ  tablet Take 1 tablet (20 mEq total) by mouth daily. Qty: 30 tablet, Refills: 0      CONTINUE these medications which have NOT CHANGED   Details  acetaminophen (TYLENOL) 325 MG tablet Take 650 mg by mouth every 6 (six) hours as needed.    aspirin EC 81 MG tablet Take 81 mg by mouth daily.    atorvastatin (LIPITOR) 40 MG tablet Take 1 tablet (40 mg total) by mouth daily at 6 PM. Qty: 30 tablet, Refills: 2    clopidogrel (PLAVIX) 75 MG tablet Take 1 tablet (75 mg total) by mouth daily. Qty: 30 tablet, Refills: 2    colchicine 0.6 MG tablet Take 1 tablet (0.6 mg total) by mouth 2 (two) times daily. Qty: 20 tablet, Refills: 0    diltiazem (CARDIZEM) 90 MG tablet Take 90 mg by mouth 2 (two) times daily.    hydrochlorothiazide (HYDRODIURIL) 25 MG tablet Take 25 mg by mouth daily.    levETIRAcetam (KEPPRA) 250 MG tablet Take 250 mg by mouth 2 (two) times daily.    metFORMIN (GLUCOPHAGE) 1000 MG tablet Take 1 tablet (1,000 mg total) by mouth 2 (two) times daily with a meal. Qty: 60 tablet, Refills: 0    oxybutynin (DITROPAN) 5 MG tablet Take 5 mg by mouth 2 (two) times daily.    polyethylene glycol (MIRALAX / GLYCOLAX) packet Take 17 g by mouth daily. Qty: 14 each, Refills: 0    insulin glargine (LANTUS) 100 UNIT/ML injection Inject 0.12 mLs (12  Units total) into the skin at bedtime. Qty: 10 mL, Refills: 11      STOP taking these medications     predniSONE (DELTASONE) 50 MG tablet         Today   VITAL SIGNS:  Blood pressure (!) 154/64, pulse 64, temperature (!) 97.5 F (36.4 C), temperature source Oral, resp. rate 20, height  (1.575 m), weight 90.7 kg (199 lb 14.4 oz), SpO2 100 %.  I/O:  No intake or output data in the 24 hours ending 06/10/17 1354  PHYSICAL EXAMINATION:  Physical Exam  GENERAL:  71 y.o.-year-old patient lying in the bed with no acute distress.  LUNGS: Normal breath sounds bilaterally, no wheezing, rales,rhonchi or crepitation. No use of  accessory muscles of respiration.  CARDIOVASCULAR: S1, S2 normal. No murmurs, rubs, or gallops.  ABDOMEN: Soft, non-tender, non-distended. Bowel sounds present. No organomegaly or mass.  NEUROLOGIC: Moves all 4 extremities. PSYCHIATRIC: The patient is alert and awake SKIN: No obvious rash, lesion, or ulcer.   DATA REVIEW:   CBC  Recent Labs Lab 06/10/17 0626  WBC 7.8  HGB 14.2  HCT 41.5  PLT 247    Chemistries   Recent Labs Lab 06/09/17 1305 06/10/17 0626  NA 138 140  K 3.2* 3.1*  CL 101 102  CO2 27 28  GLUCOSE 121* 101*  BUN 28* 25*  CREATININE 1.08* 0.94  CALCIUM 9.9 9.5  AST 29  --   ALT 31  --   ALKPHOS 62  --   BILITOT 0.6  --     Cardiac Enzymes  Recent Labs Lab 06/09/17 1305  TROPONINI <0.03    Microbiology Results  Results for orders placed or performed during the hospital encounter of 01/22/16  Urine culture     Status: Abnormal   Collection Time: 02/08/16  4:30 AM  Result Value Ref Range Status   Specimen Description URINE, RANDOM  Final   Special Requests NONE  Final   Culture MULTIPLE SPECIES PRESENT, SUGGEST RECOLLECTION (A)  Final   Report Status 02/09/2016 FINAL  Final  Urine culture     Status: Abnormal   Collection Time: 02/10/16  7:30 AM  Result Value Ref Range Status   Specimen Description URINE, CLEAN CATCH  Final   Special Requests NONE  Final   Culture >=100,000 COLONIES/mL ESCHERICHIA COLI (A)  Final   Report Status 02/12/2016 FINAL  Final   Organism ID, Bacteria ESCHERICHIA COLI (A)  Final      Susceptibility   Escherichia coli - MIC*    AMPICILLIN >=32 RESISTANT Resistant     CEFAZOLIN <=4 SENSITIVE Sensitive     CEFTRIAXONE <=1 SENSITIVE Sensitive     CIPROFLOXACIN <=0.25 SENSITIVE Sensitive     GENTAMICIN >=16 RESISTANT Resistant     IMIPENEM <=0.25 SENSITIVE Sensitive     NITROFURANTOIN <=16 SENSITIVE Sensitive     TRIMETH/SULFA <=20 SENSITIVE Sensitive     AMPICILLIN/SULBACTAM 16 INTERMEDIATE Intermediate      PIP/TAZO <=4 SENSITIVE Sensitive     Extended ESBL NEGATIVE Sensitive     * >=100,000 COLONIES/mL ESCHERICHIA COLI    RADIOLOGY:  Ct Head Wo Contrast  Result Date: 06/09/2017 CLINICAL DATA:  Altered level of consciousness. Unresponsive while bleeding breakfast EXAM: CT HEAD WITHOUT CONTRAST TECHNIQUE: Contiguous axial images were obtained from the base of the skull through the vertex without intravenous contrast. COMPARISON:  MR brain 06/03/2017 FINDINGS: Brain: No evidence of acute infarction, hemorrhage, extra-axial collection, ventriculomegaly, or mass effect. Old left  basal ganglia lacunar infarct. Generalized cerebral atrophy. Periventricular white matter low attenuation likely secondary to microangiopathy. Vascular: Cerebrovascular atherosclerotic calcifications are noted. Skull: Negative for fracture or focal lesion. Sinuses/Orbits: Visualized portions of the orbits are unremarkable. Visualized portions of the paranasal sinuses and mastoid air cells are unremarkable. Other: None. IMPRESSION: 1. No acute intracranial pathology. Electronically Signed   By: Elige Ko   On: 06/09/2017 12:54   Mr Brain Wo Contrast  Result Date: 06/09/2017 CLINICAL DATA:  Ataxia with stroke suspected. Bradycardia. Patient currently back at baseline. EXAM: MRI HEAD WITHOUT CONTRAST TECHNIQUE: Multiplanar, multiecho pulse sequences of the brain and surrounding structures were obtained without intravenous contrast. COMPARISON:  06/03/2017 FINDINGS: Brain: No acute infarction, hemorrhage, hydrocephalus, extra-axial collection or mass lesion. Advanced chronic microvascular ischemia with confluent ischemic gliosis in the cerebral white matter. There is been a remote small vessel infarct in left basal ganglia and in the bilateral corona radiata. Mild generalized cerebral volume loss. Vascular: Major flow voids are patent. Skull and upper cervical spine: Negative for marrow lesion. Cervical facet arthropathy and degenerative  disc narrowing. Sinuses/Orbits: Negative IMPRESSION: 1. No acute finding, including infarct. Stable from study 6 days prior. 2. Advanced chronic small vessel ischemia with remote small vessel infarcts in the left basal ganglia and bilateral corona radiata. Electronically Signed   By: Marnee Spring M.D.   On: 06/09/2017 16:29   US Carotid Bilateral  Result Date: 06/09/2017 CLINICAL DATA:  CVA, hypertension, syncope, hyperlipidemia EXAM: BILATERAL CAROTID DUPLEX ULTRASOUND TECHNIQUE: Wallace Cullens scale imaging, color Doppler and duplex ultrasound were performed of bilateral carotid and vertebral arteries in the neck. COMPARISON:  06/09/2017 head CT without contrast FINDINGS: Criteria: Quantification of carotid stenosis is based on velocity parameters that correlate the residual internal carotid diameter with NASCET-based stenosis levels, using the diameter of the distal internal carotid lumen as the denominator for stenosis measurement. The following velocity measurements were obtained: RIGHT ICA:  79/26 cm/sec CCA:  73/14 cm/sec SYSTOLIC ICA/CCA RATIO:  1.1 DIASTOLIC ICA/CCA RATIO:  2.0 ECA:  83 cm/sec LEFT ICA:  56/5 cm/sec CCA:  45/5 cm/sec SYSTOLIC ICA/CCA RATIO:  1.2 DIASTOLIC ICA/CCA RATIO:  0.9 ECA:  65 cm/sec RIGHT CAROTID ARTERY: Mild heterogeneous scattered plaque calcification of the right carotid system. Despite this, no hemodynamically significant right ICA stenosis, velocity elevation, or turbulent flow. Degree of narrowing less than 50% by ultrasound criteria. RIGHT VERTEBRAL ARTERY:  Antegrade LEFT CAROTID ARTERY: Similar mild scattered calcified left carotid system atherosclerosis. Despite this, no hemodynamically significant left ICA stenosis, velocity elevation, or turbulent flow. LEFT VERTEBRAL ARTERY:  Antegrade IMPRESSION: Mild bilateral carotid atherosclerosis. No hemodynamically significant ICA stenosis. Degree of narrowing less than 50% by ultrasound criteria. Patent antegrade vertebral flow  bilaterally Electronically Signed   By: Judie Petit.  Shick M.D.   On: 06/09/2017 16:11   Dg Chest Portable 1 View  Result Date: 06/09/2017 CLINICAL DATA:  Pt presents to ED from group home [address] via [facility] for episode of unresponsiveness while eating breakfast. When EMS arrived pt was opening eyes but confusedPt had a seizure several weeks ago EXAM: PORTABLE CHEST 1 VIEW COMPARISON:  06/03/2017 FINDINGS: Cardiac silhouette is normal in size. No mediastinal or hilar masses. No evidence of adenopathy. Prominent bronchovascular markings, stable. No evidence of pneumonia or pulmonary edema. No pleural effusion or pneumothorax. Skeletal structures are demineralized but grossly intact. IMPRESSION: No active disease. Electronically Signed   By: Amie Portland M.D.   On: 06/09/2017 13:01    Follow up  with PCP in 1 week.  Management plans discussed with the patient, family and they are in agreement.  CODE STATUS:     Code Status Orders        Start     Ordered   06/09/17 1649  Full code  Continuous     06/09/17 1649    Code Status History    Date Active Date Inactive Code Status Order ID Comments User Context   06/03/2017  4:11 PM 06/04/2017  5:49 PM Full Code [id number]  Enid Baas, MD Inpatient   12/21/2015 11:48 PM 12/26/2015  8:28 PM Full Code [id number]  Houston Siren, MD Inpatient      TOTAL TIME TAKING CARE OF THIS PATIENT ON DAY OF DISCHARGE: more than 30 minutes.   Milagros Loll R M.D on 06/10/2017 at 1:54 PM  Between 7am to 6pm - Pager - [phone]  After 6pm go to [email or web address] - password [email or web address] [facility]  [facility] [place] [facility]  Office  [phone]  CC: Primary care physician; Marguarite Arbour, MD  Note: This dictation was prepared with Dragon dictation along with smaller phrase technology. Any transcriptional errors that result from this process are unintentional.

## 2017-06-10 NOTE — [place] FL2 (Addendum)
[place] MEDICAID FL2 LEVEL OF CARE SCREENING TOOL     IDENTIFICATION  Patient Name: Emily [name] Birthdate: [birth year] Sex: female Admission Date (Current Location): 06/09/2017  [facility] and [place] Number:  Chiropodist and Address:  [facility], [address] [place], [place] [address]      Provider Number: [id number]  Attending Physician Name and Address:  Milagros Loll, MD  Relative Name and Phone Number:       Current Level of Care: Hospital Recommended Level of Care: Other (Comment) (return to group home) Prior Approval Number:    Date Approved/Denied:   PASRR Number:    Discharge Plan: Domiciliary (Rest home)    Current Diagnoses: Patient Active Problem List   Diagnosis Date Noted  . TIA (transient ischemic attack) 06/09/2017  . Gouty arthritis 06/03/2017  . Acute CVA (cerebrovascular accident) (HCC) 12/22/2015  . Syncope 12/21/2015  . Hyperlipidemia 06/23/2013  . DM (diabetes mellitus) (HCC) 06/23/2013  . HTN (hypertension) 06/23/2013  . Morbid obesity (HCC) 06/23/2013  . Abnormal ECG 06/23/2013    Orientation RESPIRATION BLADDER Height & Weight     Self, Situation, Place  Normal Continent Weight: 199 lb 14.4 oz (90.7 kg) Height:   (157.5 cm)  BEHAVIORAL SYMPTOMS/MOOD NEUROLOGICAL BOWEL NUTRITION STATUS      Continent Diet  AMBULATORY STATUS COMMUNICATION OF NEEDS Skin   Supervision Verbally Normal                       Personal Care Assistance Level of Assistance  Bathing, Feeding, Dressing Bathing Assistance: Limited assistance Feeding assistance: Independent Dressing Assistance: Limited assistance     Functional Limitations Info  Sight, Hearing, Speech Sight Info: Adequate Hearing Info: Adequate Speech Info: Adequate    SPECIAL CARE FACTORS FREQUENCY                       Contractures Contractures Info: Not present    Additional Factors Info  Code Status, Allergies Code  Status Info: Full Code Allergies Info:  Enalapril, Prednisone             Discharge Medications: Current Discharge Medication List        START taking these medications   Details  potassium chloride SA (K-DUR,KLOR-CON) 20 MEQ tablet Take 1 tablet (20 mEq total) by mouth daily. Qty: 30 tablet, Refills: 0          CONTINUE these medications which have NOT CHANGED   Details  acetaminophen (TYLENOL) 325 MG tablet Take 650 mg by mouth every 6 (six) hours as needed.    aspirin EC 81 MG tablet Take 81 mg by mouth daily.    atorvastatin (LIPITOR) 40 MG tablet Take 1 tablet (40 mg total) by mouth daily at 6 PM. Qty: 30 tablet, Refills: 2    clopidogrel (PLAVIX) 75 MG tablet Take 1 tablet (75 mg total) by mouth daily. Qty: 30 tablet, Refills: 2    colchicine 0.6 MG tablet Take 1 tablet (0.6 mg total) by mouth 2 (two) times daily. Qty: 20 tablet, Refills: 0    diltiazem (CARDIZEM) 90 MG tablet Take 90 mg by mouth 2 (two) times daily.    hydrochlorothiazide (HYDRODIURIL) 25 MG tablet Take 25 mg by mouth daily.    levETIRAcetam (KEPPRA) 250 MG tablet Take 250 mg by mouth 2 (two) times daily.    metFORMIN (GLUCOPHAGE) 1000 MG tablet Take 1 tablet (1,000 mg total) by mouth 2 (two)  times daily with a meal. Qty: 60 tablet, Refills: 0    oxybutynin (DITROPAN) 5 MG tablet Take 5 mg by mouth 2 (two) times daily.    polyethylene glycol (MIRALAX / GLYCOLAX) packet Take 17 g by mouth daily. Qty: 14 each, Refills: 0    insulin glargine (LANTUS) 100 UNIT/ML injection Inject 0.12 mLs (12 Units total) into the skin at bedtime. Qty: 10 mL, Refills: 11         STOP taking these medications     predniSONE (DELTASONE) 50 MG tablet     Relevant Imaging Results:  Relevant Lab Results:   Additional Information  Resume Home health  Shela Nevin, Evlyn [place], [place]

## 2017-06-10 NOTE — Clinical Social Work Note (Signed)
Please see below most recent admission and PSA completed by co-worker.  No changes to PSA.  Patient admitted from family care home: [facility]  located at [address], [place], [address]. Group home phone # [phone], fax # [phone].    Patient sister Emily [name] is Electrical engineer Plan at this time is to work with PT and transition back to group home once medically stable.    Patient admitted for:  AMS:  she was not able to talk with her nurse, was alert, able to understand, but could not express herself- lasted for 30 min. She is in observation status at this time.   LCSW will follow and update FL2 once patient is medically stable for discharge.  Deretha Emory, MSW Clinical Social Work: System Wide Float Coverage for :  [phone]    Clinical Social Work Assessment  Patient Details  Name: Emily [name] MRN: [id number] Date of Birth: [birth year]  Date of referral:  06/03/17               Reason for consult:  Other (Comment Required) (From [facility] )                Permission sought to share information with:  Facility Industrial/product designer granted to share information::  Yes, Verbal Permission Granted             Name::      Group Home             Agency::                Relationship::                Contact Information:     Housing/Transportation Living arrangements for the past 2 months:  Group Home Source of Information:  Facility, Other (Comment Required) (Sister Emily [name]) Patient Interpreter Needed:  None Criminal Activity/Legal Involvement Pertinent to Current Situation/Hospitalization:  No - Comment as needed Significant Relationships:  Adult Children, Siblings Lives with:  Facility Resident Do you feel safe going back to the place where you live?  Yes Need for family participation in patient care:  Yes (Comment)  Care giving concerns:  Patient is a resident at [facility] located at  [address], [place], [address]. Group home phone # [phone], fax # [phone].     Social Worker assessment / plan:  Visual merchandiser (CSW) reviewed chart and noted that patient is from [facility] in [place]. CSW contacted group home owner Elbia [phone] to get additional information. Per Elbia patient has been a resident for almost 1 year now, walks without an assistive device at baseline and is on room air. Per Elbia patient does have a walker but she does not use it. Per Hennie Duos patient is intermittently confused and her sister Emily [name] is her HPOA. Per Elbia patient can return to the group home and they will provide transport. Per chart patient is not alert and oriented so CSW contacted patient's sister Emily [name]. Per sister she is patient's HPOA and confirmed that patient is from the [facility]. Sister is agreeable for patient to return to the group home when stable. CSW will continue to follow and assist as needed. FL2 complete.   Employment status:  Disabled (Comment on whether or not currently receiving Disability) Insurance information:  Medicare PT Recommendations:  Not assessed at this time Information / Referral to community resources:  Other (Comment Required) (Patient will return to Group Home. )  Patient/Family's Response to care:  Patient's sister is agreeable for her to return to the group home.   Patient/Family's Understanding of and Emotional Response to Diagnosis, Current Treatment, and Prognosis:  Patient's sister was very pleasant and thanked CSW for assistance.   Emotional Assessment Appearance:  Appears stated age Attitude/Demeanor/Rapport:  Unable to Assess Affect (typically observed):  Unable to Assess Orientation:  Oriented to Self, Oriented to Place, Oriented to Situation, Fluctuating Orientation (Suspected and/or reported Sundowners) Alcohol / Substance use:  Not Applicable Psych involvement (Current and /or in the  community):  No (Comment)  Discharge Needs  Concerns to be addressed:  Discharge Planning Concerns Readmission within the last 30 days:  No Current discharge risk:  Chronically ill Barriers to Discharge:  Continued Medical Work up   [employer], Darleen Crocker, LCSW 06/03/2017, 4:54 PM

## 2017-06-10 NOTE — Progress Notes (Addendum)
LCSW continues to follow: Patient medically stable for discharge.  Spoke with group home and they are asking for discharge summary and FL2. Updated all the above and faxed to [phone]  Awaiting call back from group home to approve FL2. Group home reports family will transport patient back to facility. 3:26 PM  Spoke with group home and Fl2/ dc summary received and reviewed medications. Made updates and resent to facility for review.   Sister at bedside, will transport patient back to group home. No barriers at this time.  No other needs. DC back to group home.  Deretha Emory, MSW Clinical Social Work: Optician, dispensing Coverage for :  [phone]

## 2017-06-10 NOTE — Evaluation (Signed)
Physical Therapy Evaluation Patient Details Name: Emily [name] MRN: [id number] DOB: 03/04/1946 Today's Date: 06/10/2017   History of Present Illness  Pt is a 71 y.o. female presenting to hospital with episode of unresponsiveness while eating breakfast; HR found to be 40 bpm in ED.  Pt with recent hospital admission for gout.  Pt now admitted with transient aphasia and bradycardia.  CT and MRI of head negative for acute intracranial abnormality.  PMH includes residual facial droop and weakness from prior stroke, seizure, htn, DM, syncope and collapse.  Clinical Impression  Prior to hospital admission, pt was recently walking with RW (but prior to recent hospitalizations was not using an AD for ambulation); also receiving HHPT and HHOT.  Pt lives at a group home with ramp to enter.  Currently pt is modified independent supine to sit and CGA with transfers and ambulation around nursing loop with RW.  Pt requiring vc's for hand placement with transfers.  Pt would benefit from skilled PT to address noted impairments and functional limitations (see below for any additional details).  Upon hospital discharge, recommend pt discharge to home with HHPT.    Follow Up Recommendations Home health PT;Supervision - Intermittent    Equipment Recommendations  Rolling walker with 5" wheels (pt already has RW)    Recommendations for Other Services       Precautions / Restrictions Precautions Precautions: Fall Restrictions Weight Bearing Restrictions: No      Mobility  Bed Mobility Overal bed mobility: Modified Independent Bed Mobility: Supine to Sit     Supine to sit: Modified independent (Device/Increase time)     General bed mobility comments: bed flat; increased time to perform but no physical assist required  Transfers Overall transfer level: Needs assistance Equipment used: Rolling walker (2 wheeled) Transfers: Sit to/from [employer] Sit to Stand: Min guard Stand  pivot transfers: Min guard       General transfer comment: vc's for hand placement sit to/from stand using RW  Ambulation/Gait Ambulation/Gait assistance: Min guard Ambulation Distance (Feet): 195 Feet Assistive device: Rolling walker (2 wheeled)   Gait velocity: decreased   General Gait Details: forward trunk flexion; decreased B step length; decreased foot clearance and heel strike; L toe out greater than R toe out  Stairs            Wheelchair Mobility    Modified Rankin (Stroke Patients Only)       Balance Overall balance assessment: Needs assistance Sitting-balance support: No upper extremity supported;Feet supported Sitting balance-Leahy Scale: Good Sitting balance - Comments: sitting reaching within BOS   Standing balance support: Single extremity supported (on RW) Standing balance-Leahy Scale: Poor Standing balance comment: Pt keeps one UE on RW for support for standing activities (reaching within BOS; increased encouragement to reach mildly outside BOS); CGA                             Pertinent Vitals/Pain Pain Assessment: No/denies pain  Vitals (HR and O2 on room air) stable and WFL throughout treatment session.    Home Living Family/patient expects to be discharged to:: Group home   Available Help at Discharge: Available PRN/intermittently Type of Home: House Home Access: Ramped entrance     Home Layout: One level Home Equipment: Walker - 2 wheels      Prior Function Level of Independence: Needs assistance   Gait / Transfers Assistance Needed: Since recent hospital admission pt has been ambulating  with RW (no AD prior to recent hospitalizations).  Denies any recent falls.  ADL's / Homemaking Assistance Needed: Assist for meals, showers, dressing, and medications.        Hand Dominance        Extremity/Trunk Assessment   Upper Extremity Assessment Upper Extremity Assessment: Overall WFL for tasks assessed (no c/o R wrist  pain)    Lower Extremity Assessment Lower Extremity Assessment: RLE deficits/detail;LLE deficits/detail RLE Deficits / Details: hip flexion 4+/5; knee flexion/extension 4+/5; DF 4+/5 LLE Deficits / Details: hip flexion 4+/5; knee flexion/extension 4/5; DF 4/5       Communication   Communication: No difficulties  Cognition Arousal/Alertness: Awake/alert Behavior During Therapy: Flat affect Overall Cognitive Status: No family/caregiver present to determine baseline cognitive functioning (Pt oriented to name, DOB, and day.  Pt reporting it was August 1916.) Area of Impairment: Orientation;Problem solving (see above)                             Problem Solving: Slow processing;Requires verbal cues        General Comments General comments (skin integrity, edema, etc.): Pt laying in bed upon PT arrival with nursing tech present.  L facial droop noted (baseline per chart review).  Nursing cleared pt for participation in physical therapy.  Pt agreeable to PT session.    Exercises     Assessment/Plan    PT Assessment Patient needs continued PT services  PT Problem List Decreased strength;Decreased balance;Decreased knowledge of use of DME       PT Treatment Interventions DME instruction;Gait training;Functional mobility training;Therapeutic activities;Therapeutic exercise;Balance training;Patient/family education    PT Goals (Current goals can be found in the Care Plan section)  Acute Rehab PT Goals Patient Stated Goal: pt did not state any goal    Frequency Min 2X/week   Barriers to discharge        Co-evaluation               AM-PAC PT "6 Clicks" Daily Activity  Outcome Measure Difficulty turning over in bed (including adjusting bedclothes, sheets and blankets)?: A Little Difficulty moving from lying on back to sitting on the side of the bed? : A Little Difficulty sitting down on and standing up from a chair with arms (e.g., wheelchair, bedside commode,  etc,.)?: Unable Help needed moving to and from a bed to chair (including a wheelchair)?: A Little Help needed walking in hospital room?: A Little Help needed climbing 3-5 steps with a railing? : A Little 6 Click Score: 16    End of Session Equipment Utilized During Treatment: Gait belt Activity Tolerance: Patient tolerated treatment well Patient left: in chair;with call bell/phone within reach;with chair alarm set Nurse Communication: Mobility status;Precautions PT Visit Diagnosis: Muscle weakness (generalized) (M62.81);Other abnormalities of gait and mobility (R26.89)    Time: 1610-9604 PT Time Calculation (min) (ACUTE ONLY): 19 min   Charges:   PT Evaluation $PT Eval Low Complexity: 1 Low     PT G Codes:   PT G-Codes **NOT FOR INPATIENT CLASS** Functional Assessment Tool Used: AM-PAC 6 Clicks Basic Mobility Functional Limitation: Mobility: Walking and moving around Mobility: Walking and Moving Around Current Status (V4098): At least 40 percent but less than 60 percent impaired, limited or restricted Mobility: Walking and Moving Around Goal Status [phone]): At least 1 percent but less than 20 percent impaired, limited or restricted    Hendricks Limes, PT 06/10/17, 10:22 AM [phone]

## 2017-06-10 NOTE — Plan of Care (Signed)
Pt had trouble finding words yesterday; now resolved and back to baseline.  She has Hx of previous stroke - has slight Lside facial droop - barely noticeable.  She wked with PT and walked around nurses' stn with walker.  EEG done, MRI negative for acute stroke.  Pt going home on ASA, Plavix and Statin.  Returning to group home.  Home health is set up.  Her sister is transporting.

## 2017-06-18 DIAGNOSIS — R519 Headache, unspecified: Secondary | ICD-10-CM | POA: Insufficient documentation

## 2017-07-30 ENCOUNTER — Other Ambulatory Visit: Payer: Self-pay | Admitting: Internal Medicine

## 2017-07-30 DIAGNOSIS — R945 Abnormal results of liver function studies: Secondary | ICD-10-CM

## 2017-07-30 DIAGNOSIS — R7989 Other specified abnormal findings of blood chemistry: Secondary | ICD-10-CM

## 2017-08-05 ENCOUNTER — Ambulatory Visit
Admission: RE | Admit: 2017-08-05 | Discharge: 2017-08-05 | Disposition: A | Discharge status: Home / Self Care | Payer: Medicare Other | Source: Ambulatory Visit | Attending: Internal Medicine | Admitting: Internal Medicine

## 2017-08-05 DIAGNOSIS — R945 Abnormal results of liver function studies: Secondary | ICD-10-CM | POA: Insufficient documentation

## 2017-08-05 DIAGNOSIS — R7989 Other specified abnormal findings of blood chemistry: Secondary | ICD-10-CM

## 2017-10-30 ENCOUNTER — Other Ambulatory Visit: Payer: Self-pay | Admitting: Internal Medicine

## 2017-10-30 DIAGNOSIS — N2889 Other specified disorders of kidney and ureter: Secondary | ICD-10-CM

## 2017-11-10 ENCOUNTER — Ambulatory Visit
Admission: RE | Admit: 2017-11-10 | Discharge: 2017-11-10 | Disposition: A | Discharge status: Home / Self Care | Payer: Medicare Other | Source: Ambulatory Visit | Attending: Internal Medicine | Admitting: Internal Medicine

## 2017-11-10 DIAGNOSIS — N2889 Other specified disorders of kidney and ureter: Secondary | ICD-10-CM | POA: Diagnosis not present

## 2017-12-30 ENCOUNTER — Other Ambulatory Visit: Payer: Self-pay | Admitting: Nephrology

## 2017-12-30 DIAGNOSIS — N2889 Other specified disorders of kidney and ureter: Secondary | ICD-10-CM

## 2018-01-07 ENCOUNTER — Ambulatory Visit: Payer: Medicare Other

## 2018-01-08 ENCOUNTER — Ambulatory Visit
Admission: RE | Admit: 2018-01-08 | Discharge: 2018-01-08 | Disposition: A | Discharge status: Home / Self Care | Payer: Medicare Other | Source: Ambulatory Visit | Attending: Nephrology | Admitting: Nephrology

## 2018-01-08 DIAGNOSIS — N281 Cyst of kidney, acquired: Secondary | ICD-10-CM | POA: Insufficient documentation

## 2018-01-08 DIAGNOSIS — N2889 Other specified disorders of kidney and ureter: Secondary | ICD-10-CM | POA: Diagnosis not present

## 2018-01-08 LAB — POCT I-STAT CREATININE: Creatinine, Ser: 0.9 mg/dL (ref 0.44–1.00)

## 2018-01-08 MED ORDER — GADOBENATE DIMEGLUMINE 529 MG/ML IV SOLN
20.0000 mL | Freq: Once | INTRAVENOUS | Status: AC | PRN
  Administered 2018-01-08: 20 mL via INTRAVENOUS

## 2018-06-04 DIAGNOSIS — M25431 Effusion, right wrist: Secondary | ICD-10-CM | POA: Insufficient documentation

## 2018-06-04 DIAGNOSIS — M25441 Effusion, right hand: Secondary | ICD-10-CM | POA: Insufficient documentation

## 2018-10-26 DIAGNOSIS — M7989 Other specified soft tissue disorders: Secondary | ICD-10-CM | POA: Insufficient documentation

## 2019-02-18 ENCOUNTER — Other Ambulatory Visit: Payer: Self-pay | Admitting: Internal Medicine

## 2019-02-18 DIAGNOSIS — J189 Pneumonia, unspecified organism: Secondary | ICD-10-CM

## 2019-02-25 ENCOUNTER — Ambulatory Visit
Admission: RE | Admit: 2019-02-25 | Discharge: 2019-02-25 | Disposition: A | Discharge status: Home / Self Care | Payer: Medicare Other | Source: Ambulatory Visit | Attending: Internal Medicine | Admitting: Internal Medicine

## 2019-02-25 ENCOUNTER — Other Ambulatory Visit: Payer: Self-pay

## 2019-02-25 DIAGNOSIS — J189 Pneumonia, unspecified organism: Secondary | ICD-10-CM

## 2019-05-25 DIAGNOSIS — M7989 Other specified soft tissue disorders: Secondary | ICD-10-CM | POA: Insufficient documentation

## 2019-08-05 ENCOUNTER — Encounter: Payer: Self-pay | Admitting: Emergency Medicine

## 2019-08-05 ENCOUNTER — Emergency Department: Payer: Medicare Other

## 2019-08-05 ENCOUNTER — Other Ambulatory Visit: Payer: Self-pay

## 2019-08-05 ENCOUNTER — Inpatient Hospital Stay
Admission: EM | Admit: 2019-08-05 | Discharge: 2019-08-08 | DRG: 086 | Disposition: A | Discharge status: Home Health | Payer: Medicare Other | Attending: Internal Medicine | Admitting: Internal Medicine

## 2019-08-05 ENCOUNTER — Inpatient Hospital Stay: Payer: Medicare Other

## 2019-08-05 DIAGNOSIS — R55 Syncope and collapse: Secondary | ICD-10-CM | POA: Diagnosis present

## 2019-08-05 DIAGNOSIS — Z87891 Personal history of nicotine dependence: Secondary | ICD-10-CM | POA: Diagnosis not present

## 2019-08-05 DIAGNOSIS — Z7902 Long term (current) use of antithrombotics/antiplatelets: Secondary | ICD-10-CM | POA: Diagnosis not present

## 2019-08-05 DIAGNOSIS — R4701 Aphasia: Secondary | ICD-10-CM | POA: Diagnosis present

## 2019-08-05 DIAGNOSIS — R001 Bradycardia, unspecified: Secondary | ICD-10-CM

## 2019-08-05 DIAGNOSIS — R410 Disorientation, unspecified: Secondary | ICD-10-CM

## 2019-08-05 DIAGNOSIS — Y92008 Other place in unspecified non-institutional (private) residence as the place of occurrence of the external cause: Secondary | ICD-10-CM

## 2019-08-05 DIAGNOSIS — Z7982 Long term (current) use of aspirin: Secondary | ICD-10-CM

## 2019-08-05 DIAGNOSIS — R402242 Coma scale, best verbal response, confused conversation, at arrival to emergency department: Secondary | ICD-10-CM | POA: Diagnosis present

## 2019-08-05 DIAGNOSIS — R29705 NIHSS score 5: Secondary | ICD-10-CM | POA: Diagnosis present

## 2019-08-05 DIAGNOSIS — I10 Essential (primary) hypertension: Secondary | ICD-10-CM | POA: Diagnosis present

## 2019-08-05 DIAGNOSIS — Z7984 Long term (current) use of oral hypoglycemic drugs: Secondary | ICD-10-CM

## 2019-08-05 DIAGNOSIS — E782 Mixed hyperlipidemia: Secondary | ICD-10-CM | POA: Diagnosis present

## 2019-08-05 DIAGNOSIS — S066X1A Traumatic subarachnoid hemorrhage with loss of consciousness of 30 minutes or less, initial encounter: Secondary | ICD-10-CM | POA: Diagnosis present

## 2019-08-05 DIAGNOSIS — R402142 Coma scale, eyes open, spontaneous, at arrival to emergency department: Secondary | ICD-10-CM | POA: Diagnosis present

## 2019-08-05 DIAGNOSIS — M109 Gout, unspecified: Secondary | ICD-10-CM | POA: Diagnosis present

## 2019-08-05 DIAGNOSIS — Z79899 Other long term (current) drug therapy: Secondary | ICD-10-CM | POA: Diagnosis not present

## 2019-08-05 DIAGNOSIS — Z888 Allergy status to other drugs, medicaments and biological substances status: Secondary | ICD-10-CM | POA: Diagnosis not present

## 2019-08-05 DIAGNOSIS — W1830XA Fall on same level, unspecified, initial encounter: Secondary | ICD-10-CM | POA: Diagnosis present

## 2019-08-05 DIAGNOSIS — R443 Hallucinations, unspecified: Secondary | ICD-10-CM | POA: Diagnosis present

## 2019-08-05 DIAGNOSIS — Z20828 Contact with and (suspected) exposure to other viral communicable diseases: Secondary | ICD-10-CM | POA: Diagnosis present

## 2019-08-05 DIAGNOSIS — E785 Hyperlipidemia, unspecified: Secondary | ICD-10-CM

## 2019-08-05 DIAGNOSIS — Z8249 Family history of ischemic heart disease and other diseases of the circulatory system: Secondary | ICD-10-CM | POA: Diagnosis not present

## 2019-08-05 DIAGNOSIS — R402362 Coma scale, best motor response, obeys commands, at arrival to emergency department: Secondary | ICD-10-CM | POA: Diagnosis present

## 2019-08-05 DIAGNOSIS — R569 Unspecified convulsions: Secondary | ICD-10-CM | POA: Diagnosis present

## 2019-08-05 DIAGNOSIS — E1169 Type 2 diabetes mellitus with other specified complication: Secondary | ICD-10-CM | POA: Diagnosis present

## 2019-08-05 DIAGNOSIS — Z8673 Personal history of transient ischemic attack (TIA), and cerebral infarction without residual deficits: Secondary | ICD-10-CM

## 2019-08-05 LAB — HEMOGLOBIN A1C
Hgb A1c MFr Bld: 5.5 % (ref 4.8–5.6)
Mean Plasma Glucose: 111.15 mg/dL

## 2019-08-05 LAB — TROPONIN I (HIGH SENSITIVITY): Troponin I (High Sensitivity): 3 ng/L (ref <18)

## 2019-08-05 LAB — URINALYSIS, COMPLETE (UACMP) WITH MICROSCOPIC
Bilirubin Urine: NEGATIVE
Glucose, UA: NEGATIVE mg/dL
Hgb urine dipstick: NEGATIVE
Ketones, ur: NEGATIVE mg/dL
Leukocytes,Ua: NEGATIVE
Nitrite: NEGATIVE
Protein, ur: NEGATIVE mg/dL
Specific Gravity, Urine: 1.012 (ref 1.005–1.030)
pH: 7 (ref 5.0–8.0)

## 2019-08-05 LAB — CBC
HCT: 39.8 % (ref 36.0–46.0)
Hemoglobin: 12.8 g/dL (ref 12.0–15.0)
MCH: 28.4 pg (ref 26.0–34.0)
MCHC: 32.2 g/dL (ref 30.0–36.0)
MCV: 88.2 fL (ref 80.0–100.0)
Platelets: 193 10*3/uL (ref 150–400)
RBC: 4.51 MIL/uL (ref 3.87–5.11)
RDW: 12.6 % (ref 11.5–15.5)
WBC: 5.2 10*3/uL (ref 4.0–10.5)
nRBC: 0 % (ref 0.0–0.2)

## 2019-08-05 LAB — ABO/RH: ABO/RH(D): O POS

## 2019-08-05 LAB — BASIC METABOLIC PANEL
Anion gap: 9 (ref 5–15)
BUN: 23 mg/dL (ref 8–23)
CO2: 28 mmol/L (ref 22–32)
Calcium: 9.8 mg/dL (ref 8.9–10.3)
Chloride: 104 mmol/L (ref 98–111)
Creatinine, Ser: 0.92 mg/dL (ref 0.44–1.00)
GFR calc Af Amer: >60 mL/min (ref >60)
GFR calc non Af Amer: >60 mL/min (ref >60)
Glucose, Bld: 105 mg/dL — ABNORMAL HIGH (ref 70–99)
Potassium: 3.8 mmol/L (ref 3.5–5.1)
Sodium: 141 mmol/L (ref 135–145)

## 2019-08-05 LAB — GLUCOSE, CAPILLARY
Glucose-Capillary: 86 mg/dL (ref 70–99)
Glucose-Capillary: 64 mg/dL — ABNORMAL LOW (ref 70–99)
Glucose-Capillary: 81 mg/dL (ref 70–99)
Glucose-Capillary: 94 mg/dL (ref 70–99)

## 2019-08-05 LAB — TYPE AND SCREEN
ABO/RH(D): O POS
Antibody Screen: NEGATIVE

## 2019-08-05 LAB — HIV ANTIBODY (ROUTINE TESTING W REFLEX): HIV Screen 4th Generation wRfx: NONREACTIVE

## 2019-08-05 MED ORDER — INSULIN ASPART 100 UNIT/ML ~~LOC~~ SOLN: 0.0000 [IU] | Freq: Every day | SUBCUTANEOUS | Status: DC

## 2019-08-05 MED ORDER — SODIUM CHLORIDE 0.9 % IV SOLN
INTRAVENOUS | Status: DC
  Administered 2019-08-06: 02:00:00 via INTRAVENOUS

## 2019-08-05 MED ORDER — ONDANSETRON HCL 4 MG PO TABS: 4.0000 mg | ORAL_TABLET | Freq: Four times a day (QID) | ORAL | Status: DC | PRN

## 2019-08-05 MED ORDER — ACETAMINOPHEN 650 MG RE SUPP: 650.0000 mg | Freq: Four times a day (QID) | RECTAL | Status: DC | PRN

## 2019-08-05 MED ORDER — POLYETHYLENE GLYCOL 3350 17 G PO PACK
17.0000 g | PACK | Freq: Every day | ORAL | Status: DC | PRN
  Administered 2019-08-07: 23:00:00 17 g via ORAL
  Filled 2019-08-05: qty 1

## 2019-08-05 MED ORDER — OXYBUTYNIN CHLORIDE 5 MG PO TABS
5.0000 mg | ORAL_TABLET | Freq: Two times a day (BID) | ORAL | Status: DC
  Administered 2019-08-06 – 2019-08-07 (×3): 5 mg via ORAL
  Filled 2019-08-05 (×5): qty 1

## 2019-08-05 MED ORDER — ATORVASTATIN CALCIUM 20 MG PO TABS
40.0000 mg | ORAL_TABLET | ORAL | Status: DC
  Administered 2019-08-06 – 2019-08-08 (×3): 40 mg via ORAL
  Filled 2019-08-05 (×4): qty 2

## 2019-08-05 MED ORDER — SODIUM CHLORIDE 0.9 % IV SOLN
Freq: Once | INTRAVENOUS | Status: AC
  Administered 2019-08-05: 16:00:00 via INTRAVENOUS

## 2019-08-05 MED ORDER — LEVETIRACETAM 500 MG PO TABS
500.0000 mg | ORAL_TABLET | Freq: Two times a day (BID) | ORAL | Status: DC
  Administered 2019-08-05 – 2019-08-08 (×6): 500 mg via ORAL
  Filled 2019-08-05 (×8): qty 1

## 2019-08-05 MED ORDER — METFORMIN HCL 500 MG PO TABS: 1000.0000 mg | ORAL_TABLET | Freq: Two times a day (BID) | ORAL | Status: DC

## 2019-08-05 MED ORDER — POTASSIUM CHLORIDE CRYS ER 20 MEQ PO TBCR
20.0000 meq | EXTENDED_RELEASE_TABLET | Freq: Every day | ORAL | Status: DC
  Administered 2019-08-06 – 2019-08-08 (×3): 20 meq via ORAL
  Filled 2019-08-05 (×3): qty 1

## 2019-08-05 MED ORDER — HYDROCHLOROTHIAZIDE 25 MG PO TABS
25.0000 mg | ORAL_TABLET | Freq: Every day | ORAL | Status: DC
  Administered 2019-08-06 – 2019-08-08 (×3): 25 mg via ORAL
  Filled 2019-08-05 (×3): qty 1

## 2019-08-05 MED ORDER — INSULIN ASPART 100 UNIT/ML ~~LOC~~ SOLN
0.0000 [IU] | Freq: Three times a day (TID) | SUBCUTANEOUS | Status: DC
  Administered 2019-08-07: 18:00:00 1 [IU] via SUBCUTANEOUS
  Administered 2019-08-07: 12:00:00 2 [IU] via SUBCUTANEOUS
  Administered 2019-08-08: 1 [IU] via SUBCUTANEOUS
  Filled 2019-08-05 (×3): qty 1

## 2019-08-05 MED ORDER — ONDANSETRON HCL 4 MG/2ML IJ SOLN: 4.0000 mg | Freq: Four times a day (QID) | INTRAMUSCULAR | Status: DC | PRN

## 2019-08-05 MED ORDER — LEVETIRACETAM IN NACL 500 MG/100ML IV SOLN
500.0000 mg | Freq: Once | INTRAVENOUS | Status: AC
  Administered 2019-08-05: 500 mg via INTRAVENOUS
  Filled 2019-08-05: qty 100

## 2019-08-05 MED ORDER — COLCHICINE 0.6 MG PO TABS
0.6000 mg | ORAL_TABLET | Freq: Every day | ORAL | Status: DC
  Administered 2019-08-06 – 2019-08-07 (×2): 0.6 mg via ORAL
  Filled 2019-08-05 (×3): qty 1

## 2019-08-05 MED ORDER — ACETAMINOPHEN 325 MG PO TABS
650.0000 mg | ORAL_TABLET | Freq: Four times a day (QID) | ORAL | Status: DC | PRN
  Administered 2019-08-05 – 2019-08-06 (×2): 650 mg via ORAL
  Filled 2019-08-05 (×2): qty 2

## 2019-08-05 NOTE — ED Notes (Signed)
Pt resting calmly in bed. Alert. Bed locked low. Rail up. Call bell within reach.

## 2019-08-05 NOTE — ED Notes (Signed)
EDP [place] verbal order to hold on plts until Neuro states needed. Bag had already been spiked. Blood bank notified. Bag taken bag to bloodbank.

## 2019-08-05 NOTE — ED Notes (Signed)
EDP Jessup at bedside 

## 2019-08-05 NOTE — ED Notes (Signed)
Reported to [place], Therapist, sports. Attending/rounding doc paged about neuro changes.

## 2019-08-05 NOTE — Consult Note (Signed)
Referring Physician:  No referring provider defined for this encounter.  Primary Physician:  Idelle Crouch, MD  Chief Complaint: Syncope  History of Present Illness: Emily [name] is a 73 y.o. female who presents as an ED consult with chief complaint of syncopal episode resulting in trauamatic subarachnoid hemorrhage.  There is no family present in room during evaluation.  Patient states that she was messing with a remote of the TV when she "just fell out".  States that she does not remember much of anything.  Denies headache, vision changes, speech changes, confusion. Currently on aspirin and Plavix  Review of Systems:  A 10 point review of systems is negative, except for the pertinent positives and negatives detailed in the HPI.  Past Medical History: Past Medical History:  Diagnosis Date  . Essential hypertension, benign   . Gout, unspecified   . Mixed hyperlipidemia   . Stroke [facility])    march 2017  . Syncope and collapse   . Type II or unspecified type diabetes mellitus without mention of complication, uncontrolled     Past Surgical History: Past Surgical History:  Procedure Laterality Date  . ABDOMINAL HYSTERECTOMY    . TEE WITHOUT CARDIOVERSION N/A 12/26/2015   Procedure: TRANSESOPHAGEAL ECHOCARDIOGRAM (TEE);  Surgeon: Teodoro Spray, MD;  Location: [facility] ORS;  Service: Cardiovascular;  Laterality: N/A;  . TONSILLECTOMY AND ADENOIDECTOMY      Allergies: Allergies as of 08/05/2019 - Review Complete 08/05/2019  Allergen Reaction Noted  . Enalapril Other (See Comments) and Shortness Of Breath 06/22/2013  . Prednisone Palpitations 04/11/2016    Medications:  Current Facility-Administered Medications:  .  acetaminophen (TYLENOL) tablet 650 mg, 650 mg, Oral, Q6H PRN **OR** acetaminophen (TYLENOL) suppository 650 mg, 650 mg, Rectal, Q6H PRN, Loletha Grayer, MD .  Derrill Memo ON 08/06/2019] atorvastatin (LIPITOR) tablet 40 mg, 40 mg, Oral, BH-q7a, Wieting, Richard,  MD .  Derrill Memo ON 08/06/2019] colchicine tablet 0.6 mg, 0.6 mg, Oral, Daily, Leslye Peer, Richard, MD .  Derrill Memo ON 08/06/2019] hydrochlorothiazide (HYDRODIURIL) tablet 25 mg, 25 mg, Oral, Daily, Wieting, Richard, MD .  insulin aspart (novoLOG) injection 0-5 Units, 0-5 Units, Subcutaneous, QHS, Wieting, Richard, MD .  insulin aspart (novoLOG) injection 0-9 Units, 0-9 Units, Subcutaneous, TID WC, Wieting, Richard, MD .  levETIRAcetam (KEPPRA) tablet 500 mg, 500 mg, Oral, BID, Wieting, Richard, MD .  metFORMIN (GLUCOPHAGE) tablet 1,000 mg, 1,000 mg, Oral, BID WC, Wieting, Richard, MD .  ondansetron (ZOFRAN) tablet 4 mg, 4 mg, Oral, Q6H PRN **OR** ondansetron (ZOFRAN) injection 4 mg, 4 mg, Intravenous, Q6H PRN, Loletha Grayer, MD .  Derrill Memo ON 08/06/2019] oxybutynin (DITROPAN) tablet 5 mg, 5 mg, Oral, BID, Wieting, Richard, MD .  polyethylene glycol (MIRALAX / GLYCOLAX) packet 17 g, 17 g, Oral, Daily PRN, Loletha Grayer, MD .  Derrill Memo ON 08/06/2019] potassium chloride SA (KLOR-CON) CR tablet 20 mEq, 20 mEq, Oral, Daily, Wieting, Richard, MD  Current Outpatient Medications:  .  acetaminophen (TYLENOL) 325 MG tablet, Take 650 mg by mouth every 6 (six) hours as needed., Disp: , Rfl:  .  allopurinol (ZYLOPRIM) 100 MG tablet, Take 200 mg by mouth daily., Disp: , Rfl:  .  aspirin EC 81 MG tablet, Take 81 mg by mouth daily., Disp: , Rfl:  .  atorvastatin (LIPITOR) 40 MG tablet, Take 1 tablet (40 mg total) by mouth daily at 6 PM. (Patient taking differently: Take 40 mg by mouth every morning. ), Disp: 30 tablet, Rfl: 2 .  clopidogrel (PLAVIX) 75 MG tablet,  Take 1 tablet (75 mg total) by mouth daily., Disp: 30 tablet, Rfl: 2 .  colchicine 0.6 MG tablet, Take 1 tablet (0.6 mg total) by mouth 2 (two) times daily., Disp: 20 tablet, Rfl: 0 .  diltiazem (CARDIZEM) 90 MG tablet, Take 90 mg by mouth 2 (two) times daily., Disp: , Rfl:  .  furosemide (LASIX) 40 MG tablet, Take 40 mg by mouth daily., Disp: , Rfl:  .   hydrochlorothiazide (HYDRODIURIL) 25 MG tablet, Take 25 mg by mouth daily., Disp: , Rfl:  .  levETIRAcetam (KEPPRA) 500 MG tablet, Take 500 mg by mouth 2 (two) times daily., Disp: , Rfl:  .  losartan (COZAAR) 100 MG tablet, Take 100 mg by mouth daily., Disp: , Rfl:  .  metFORMIN (GLUCOPHAGE) 1000 MG tablet, Take 1 tablet (1,000 mg total) by mouth 2 (two) times daily with a meal., Disp: 60 tablet, Rfl: 0 .  oxybutynin (DITROPAN) 5 MG tablet, Take 5 mg by mouth 2 (two) times daily., Disp: , Rfl:  .  polyethylene glycol (MIRALAX / GLYCOLAX) packet, Take 17 g by mouth daily. (Patient taking differently: Take 17 g by mouth daily as needed for moderate constipation. ), Disp: 14 each, Rfl: 0 .  potassium chloride SA (K-DUR,KLOR-CON) 20 MEQ tablet, Take 1 tablet (20 mEq total) by mouth daily., Disp: 30 tablet, Rfl: 0   Social History: Social History   Tobacco Use  . Smoking status: Former Games developer  . Smokeless tobacco: Never Used  Substance Use Topics  . Alcohol use: Yes  . Drug use: No    Family Medical History: Family History  Problem Relation Age of Onset  . Heart disease Mother   . Heart attack Mother   . Hypertension Mother   . Heart disease Father   . Heart attack Father   . Hypertension Father   . Heart disease Brother   . Stroke Brother   . Heart attack Other     Physical Examination: Vitals:   08/05/19 1530 08/05/19 1545  BP: (!) 143/68   Pulse: (!) 120 (!) 45  Resp: (!) 26 (!) 21  Temp:    SpO2: 100% 100%     General: Patient is well developed, well nourished, calm, collected, and in no apparent distress.  Psychiatric: Patient is non-anxious.  Head:  Pupils equal, round, and reactive to light.  ENT:  Oral mucosa appears well hydrated.  Neck:   Supple.  Full range of motion.  Respiratory: Patient is breathing without any difficulty.  Extremities: No edema.  Skin:   On exposed skin, there are no abnormal skin lesions.  NEUROLOGICAL:  General: In no acute  distress.   Awake, alert, oriented to person only.  Pupils equal round and reactive to light.  Facial tone is symmetric -noted edema around right orbit.  Tongue protrusion is slightly to the left.  Smile slightly less on right.  There is no pronator drift.  EOMI.  Only able to correctly identify 1/5 objects  Strength: Side Biceps Triceps Deltoid Grip Wrist Ext. Wrist Flex.  R 5 5  unable to perform 5 5 5   L 5 5  unable to perform 5 5 5    Side Iliopsoas Quads Hamstring PF DF  R 5 5 5 5 5   L 5 5 5 5 5    Reflexes are 1+ and symmetric at the biceps, triceps, brachioradialis, patella and achilles.   Bilateral upper and lower extremity sensation is intact to light touch and pin prick.  Clonus  is not present.  Toes are down-going.  Gait not assessed.  Hoffman's is absent.  Imaging: EXAM: CT HEAD WITHOUT CONTRAST 08/05/2019  FINDINGS: Brain: There is small volume subarachnoid hemorrhage within a left frontal sulcus. There is an additional small focus of hemorrhage adjacent to or involving the body of the fornix on the right.  No mass effect or edema. No new loss of gray-white differentiation. Confluent areas of hypoattenuation in the supratentorial white matter likely reflect advanced chronic microvascular ischemic changes. There are chronic basal ganglia infarcts. There is no extra-axial fluid collection. Prominence of the ventricles and sulci reflects stable parenchymal volume loss.  Vascular: There is atherosclerotic calcification at the skull base.  Skull: Calvarium is intact.  Sinuses/Orbits: No acute finding.  Other: None.  IMPRESSION: Trace left sulcal acute subarachnoid hemorrhage.  Small volume hemorrhage involving or adjacent to the body of the fornix on the right.  Additional chronic findings detailed above.   Assessment and Plan:  Ms. Mayer MaskerFearrington is a pleasant 73 y.o. female with traumatic subarachnoid hemorrhage after syncopal episode, currently on  aspirin and Plavix.    Plan as follows per Dr. Lucienne CapersYarbrough's note:  - Repeat HCT in 6 hours to confirm stability - hold anticoagulation. Cross 2 units platelets but do not administer unless hct is worse - keppra 500 mg BID x 7 days  Ivar DrapeAmanda Anthonie Lotito, PA-C Dept. of Neurosurgery

## 2019-08-05 NOTE — ED Notes (Signed)
Attempted report to floor. Will call again soon.  

## 2019-08-05 NOTE — ED Notes (Signed)
Pt given fresh warm blankets.  

## 2019-08-05 NOTE — Consult Note (Signed)
Full note to follow.  73 yo female with syncopal event presents with likely traumatic SAH, not quite back to neurological baseline.  HCT - small L cortical SAH over the frontal lobe with an area of hemorrhage next to the body of the fornix on the right.  No midline shift or hydrocephalus.  - Repeat HCT in 6 hours to confirm stability - hold anticoagulation. Cross 2 units platelets but do not administer unless hct is worse - keppra 500 mg BID x 7 days - admit to medicine if needed for syncope evaluation

## 2019-08-05 NOTE — ED Notes (Signed)
Pt able to provide urine sample. Sent to lab. Pt c/o discomfort at R side of face where swelling is around R eye. States would like some tylenol.

## 2019-08-05 NOTE — ED Notes (Addendum)
Pt's sister at bedside. States pt's neuro baseline changes; states pt does usually know the year.

## 2019-08-05 NOTE — ED Notes (Signed)
Pt given 2 OJ and prompted to drink immediately. Also given food tray and asked to eat after finishing OJ. Will recheck BG 49mins after pt finishes OJ.

## 2019-08-05 NOTE — ED Notes (Signed)
Pt ate sandwich and finally finished OJ after prompting again. Given two fresh warm blankets.

## 2019-08-05 NOTE — ED Notes (Addendum)
Pt forgot she fell this morning but then could remember she had "passed out". Thought it was 1939. Stated she is at [facility] but named the city correctly as [employer]. Knows her name and birthday. Has hematoma to R side of head around R eye. Pt states she is on a blood thinner but cannot recall the name of it.

## 2019-08-05 NOTE — ED Triage Notes (Signed)
Pt in via [facility] from home.  Pt states "I had a stroke at home this morning so my son called EMS."  Pt states, "I just couldn't do anything and I fell."  Pt with hematoma, bruising to right eye.  Pt A/Ox4, neurologically intact at this time.

## 2019-08-05 NOTE — ED Notes (Signed)
Report given to Butch, RN. 

## 2019-08-05 NOTE — ED Triage Notes (Signed)
Pt in via EMS from home with c/o fall andnow with swelling to right side of head. Hx of CVA and diabetes and HTN. EMS reports pt has not taken her morning meds. 97%RA, 95HR, FSBS 109, BP 180/110. Alert x's 2. Denies neck pain, back pain or dizziness. PMD is Dr. Doy Hutching.

## 2019-08-05 NOTE — ED Notes (Addendum)
Provider Ouma at bedside. Pt oriented to name currently. Some things within NIHSS pt improved with and others pt is worse with. Pt couldn't name several objects in NIHSS pictures but could tell me about the boy grabbing cookies out of the cookie jar (pt did not tell me about the cookie jar in first assessment). Ouma states hold on CT and continue to monitor/assess pt closely. States to notify with any other neuro changes. No other orders at this time.

## 2019-08-05 NOTE — ED Provider Notes (Signed)
[facility] Emergency Department Provider Note   ____________________________________________   First MD Initiated Contact with Patient 08/05/19 1256     (approximate)  I have reviewed the triage vital signs and the nursing notes.   HISTORY  Chief Complaint Loss of Consciousness    HPI Emily [name] is a 73 y.o. female with past medical history of hypertension, diabetes, and stroke on Plavix presents to the ED complaining of syncope and fall.  Patient reports that she was standing in her living room this morning when she suddenly "fell out".  She denies any associated dizziness or lightheadedness, also denies any chest pain or shortness of breath.  She thinks she may have been out for as long as 10 minutes before she was brought to the ED by EMS.  She currently denies any pain, but has noticed some swelling around her right eye.  She denies any vision changes, speech changes, numbness, or weakness.  She has a history of strokes, for which she takes Plavix.  Her sister states that her mental status can wax and wane following the strokes and that she often does not know the date.        Past Medical History:  Diagnosis Date   Essential hypertension, benign    Gout, unspecified    Mixed hyperlipidemia    Stroke [facility](HCC)    march 2017   Syncope and collapse    Type II or unspecified type diabetes mellitus without mention of complication, uncontrolled     Patient Active Problem List   Diagnosis Date Noted   Traum subrac hem w LOC of 30 minutes or less, sequela (HCC) 08/05/2019   TIA (transient ischemic attack) 06/09/2017   Gouty arthritis 06/03/2017   Acute CVA (cerebrovascular accident) (HCC) 12/22/2015   Syncope and collapse 12/21/2015   Hyperlipidemia 06/23/2013   DM (diabetes mellitus) (HCC) 06/23/2013   HTN (hypertension) 06/23/2013   Morbid obesity (HCC) 06/23/2013   Abnormal ECG 06/23/2013    Past Surgical History:    Procedure Laterality Date   ABDOMINAL HYSTERECTOMY     TEE WITHOUT CARDIOVERSION N/A 12/26/2015   Procedure: TRANSESOPHAGEAL ECHOCARDIOGRAM (TEE);  Surgeon: Dalia HeadingKenneth A Fath, MD;  Location: [facility] ORS;  Service: Cardiovascular;  Laterality: N/A;   TONSILLECTOMY AND ADENOIDECTOMY      Prior to Admission medications   Medication Sig Start Date End Date Taking? Authorizing Provider  acetaminophen (TYLENOL) 325 MG tablet Take 650 mg by mouth every 6 (six) hours as needed.    [provider]  allopurinol (ZYLOPRIM) 100 MG tablet Take 200 mg by mouth daily. 07/26/19   [provider]  aspirin EC 81 MG tablet Take 81 mg by mouth daily.    [provider]  atorvastatin (LIPITOR) 40 MG tablet Take 1 tablet (40 mg total) by mouth daily at 6 PM. Patient taking differently: Take 40 mg by mouth every morning.  12/23/15   Shaune Pollackhen, Qing, MD  clopidogrel (PLAVIX) 75 MG tablet Take 1 tablet (75 mg total) by mouth daily. 12/23/15   Shaune Pollackhen, Qing, MD  colchicine 0.6 MG tablet Take 1 tablet (0.6 mg total) by mouth 2 (two) times daily. 06/04/17   Auburn BilberryPatel, Shreyang, MD  diltiazem (CARDIZEM) 90 MG tablet Take 90 mg by mouth 2 (two) times daily.    [provider]  furosemide (LASIX) 40 MG tablet Take 40 mg by mouth daily. 07/23/19   [provider]  hydrochlorothiazide (HYDRODIURIL) 25 MG tablet Take 25 mg by mouth daily.  [provider]  levETIRAcetam (KEPPRA) 500 MG tablet Take 500 mg by mouth 2 (two) times daily. 07/26/19   [provider]  losartan (COZAAR) 100 MG tablet Take 100 mg by mouth daily. 07/23/19   [provider]  metFORMIN (GLUCOPHAGE) 1000 MG tablet Take 1 tablet (1,000 mg total) by mouth 2 (two) times daily with a meal. 12/28/15   Max Sane, MD  oxybutynin (DITROPAN) 5 MG tablet Take 5 mg by mouth 2 (two) times daily.    [provider]  polyethylene glycol (MIRALAX / GLYCOLAX) packet Take 17 g by mouth daily. Patient taking  differently: Take 17 g by mouth daily as needed for moderate constipation.  12/26/15   Max Sane, MD  potassium chloride SA (K-DUR,KLOR-CON) 20 MEQ tablet Take 1 tablet (20 mEq total) by mouth daily. 06/10/17   Hillary Bow, MD    Allergies Enalapril and Prednisone  Family History  Problem Relation Age of Onset   Heart disease Mother    Heart attack Mother    Hypertension Mother    Heart disease Father    Heart attack Father    Hypertension Father    Heart disease Brother    Stroke Brother    Heart attack Other     Social History Social History   Tobacco Use   Smoking status: Former Smoker   Smokeless tobacco: Never Used  Substance Use Topics   Alcohol use: Yes   Drug use: No    Review of Systems  Constitutional: No fever/chills Eyes: No visual changes. ENT: No sore throat. Cardiovascular: Denies chest pain.  Positive for syncope. Respiratory: Denies shortness of breath. Gastrointestinal: No abdominal pain.  No nausea, no vomiting.  No diarrhea.  No constipation. Genitourinary: Negative for dysuria. Musculoskeletal: Negative for back pain. Skin: Negative for rash. Neurological: Negative for headaches, focal weakness or numbness.  ____________________________________________   PHYSICAL EXAM:  VITAL SIGNS: ED Triage Vitals [08/05/19 1143]  Enc Vitals Group     BP 125/65     Pulse Rate (!) 50     Resp 16     Temp 97.7 F (36.5 C)     Temp src      SpO2 99 %     Weight 198 lb 6.6 oz (90 kg)     Height 5\' 3"  (1.6 m)     Head Circumference      Peak Flow      Pain Score 0     Pain Loc      Pain Edu?      Excl. in [place]?     Constitutional: Alert and oriented. Eyes: Conjunctivae are normal.  Pupils equal round and reactive to light bilaterally, extraocular movements intact without nystagmus. Head: Ecchymosis and edema around right eye with no proptosis. Nose: No congestion/rhinnorhea. Mouth/Throat: Mucous membranes are moist. Neck: Normal  ROM Cardiovascular: Bradycardic, regular rhythm. Grossly normal heart sounds. Respiratory: Normal respiratory effort.  No retractions. Lungs CTAB. Gastrointestinal: Soft and nontender. No distention. Genitourinary: deferred Musculoskeletal: No lower extremity tenderness nor edema. Neurologic:  Normal speech and language. No gross focal neurologic deficits are appreciated. Skin:  Skin is warm, dry and intact. No rash noted. Psychiatric: Mood and affect are normal. Speech and behavior are normal.  ____________________________________________   LABS (all labs ordered are listed, but only abnormal results are displayed)  Labs Reviewed  BASIC METABOLIC PANEL - Abnormal; Notable for the following components:      Result Value   Glucose, Bld 105 (*)  All other components within normal limits  SARS CORONAVIRUS 2 (TAT 6-24 HRS)  CBC  GLUCOSE, CAPILLARY  URINALYSIS, COMPLETE (UACMP) WITH MICROSCOPIC  HIV ANTIBODY (ROUTINE TESTING W REFLEX)  CBG MONITORING, ED  TYPE AND SCREEN  ABO/RH  PREPARE PLATELET PHERESIS  PREPARE PLATELET PHERESIS  TROPONIN I (HIGH SENSITIVITY)  TROPONIN I (HIGH SENSITIVITY)   ____________________________________________  EKG  ED ECG REPORT I, Chesley Noon, the attending physician, personally viewed and interpreted this ECG.   Date: 08/05/2019  EKG Time: 12:09  Rate: 49  Rhythm: sinus bradycardia  Axis: LAD  Intervals:none  ST&T Change: Q waves inferiorly, new   PROCEDURES  Procedure(s) performed (including Critical Care):  .Critical Care Performed by: Chesley Noon, MD Authorized by: Chesley Noon, MD   Critical care provider statement:    Critical care time (minutes):  45   Critical care time was exclusive of:  Separately billable procedures and treating other patients and teaching time   Critical care was necessary to treat or prevent imminent or life-threatening deterioration of the following conditions:  Trauma   Critical care was  time spent personally by me on the following activities:  Discussions with consultants, evaluation of patient's response to treatment, examination of patient, ordering and performing treatments and interventions, ordering and review of laboratory studies, ordering and review of radiographic studies, pulse oximetry, re-evaluation of patient's condition, obtaining history from patient or surrogate and review of old charts   I assumed direction of critical care for this patient from another provider in my specialty: no       ____________________________________________   INITIAL IMPRESSION / ASSESSMENT AND PLAN / ED COURSE       73 year old female with history of multiple strokes on Plavix presents to the ED following syncopal episode and fall.  CT head was ordered from triage and I was notified by radiology that patient has a subarachnoid hemorrhage, most consistent with traumatic etiology.  Patient has a nonfocal neurologic exam, but does appear slightly confused and is not oriented to time and states she is currently at [facility].  Sister states that her mental status can fluctuate and not knowing the time is not unusual for her.  Case was discussed with neurosurgery, who recommends repeat head CT in 6 hours from initial and if stable and patient at her baseline, she could be cleared from a neurosurgical perspective.  He also recommends crossmatching 2 units of platelets and starting on Keppra.  Her EKG shows no evidence of arrhythmia, but does show new Q waves inferiorly, troponin pending.  Remainder of labs are unremarkable.  Patient does have a history of syncope and bradycardia, last evaluated for syncope in 2018.  Given her lack of prodromal symptoms, I would be concerned for cardiac etiology in this case and patient would benefit from admission for further syncope work-up.  Additional CTs of maxillofacial and C-spine are negative for acute process.  Patient's troponin within normal limits.   Case discussed with hospitalist and patient accepted for admission.  Plan for repeat head CT around 630 and platelet transfusion if worsening.      ____________________________________________   FINAL CLINICAL IMPRESSION(S) / ED DIAGNOSES  Final diagnoses:  Syncope, unspecified syncope type  Traumatic subarachnoid hemorrhage with loss of consciousness of 30 minutes or less, initial encounter (HCC)  Platelet inhibition due to Plavix     ED Discharge Orders    None       Note:  This document was prepared using Dragon voice recognition software  and may include unintentional dictation errors.   Chesley Noon, MD 08/05/19 [phone]

## 2019-08-05 NOTE — ED Notes (Addendum)
Pt denies needing to urinate. States felt the urge earlier but it has gone away. Agrees to let this RN know when she can provide sample. Lav tube sent to lab along with SST tubes. Pt denies pain, nausea, or vision changes.

## 2019-08-05 NOTE — ED Notes (Addendum)
Paged and messaged provider Emily [name] about neuro changes. Pt currently displaying aphasia. Awaiting orders.

## 2019-08-05 NOTE — H&P (Signed)
[facility]-  at [facility]   PATIENT NAME: Emily [name]    MR#:  [id number]  DATE OF BIRTH:  [birth year]  DATE OF ADMISSION:  08/05/2019  PRIMARY CARE PHYSICIAN: Marguarite Arbour, MD   REQUESTING/REFERRING PHYSICIAN: Dr Larinda Buttery  CHIEF COMPLAINT:   Chief Complaint  Patient presents with  . Loss of Consciousness    HISTORY OF PRESENT ILLNESS:  Emily [name]  is a 73 y.o. female coming into the hospital after a blackout.  She states that she had no symptoms.  She woke up on the floor.  No tongue bite, no urination on herself or bowel movement on herself.  Her son witnessed and did not see any shaking episodes.  She feels okay now.  She just stood up and ended up on the ground.  As per family, she has done this numerous times before.  In the ER, she complains of some pain in her right face.  A CT scan showed a small subarachnoid hemorrhage.  ER physician spoke with neurosurgery Dr. Marcell Barlow who was okay admitting her here and repeating a CAT scan in about 6 hours.  Holding blood thinners.  PAST MEDICAL HISTORY:   Past Medical History:  Diagnosis Date  . Essential hypertension, benign   . Gout, unspecified   . Mixed hyperlipidemia   . Stroke [facility])    march 2017  . Syncope and collapse   . Type II or unspecified type diabetes mellitus without mention of complication, uncontrolled     PAST SURGICAL HISTORY:   Past Surgical History:  Procedure Laterality Date  . ABDOMINAL HYSTERECTOMY    . TEE WITHOUT CARDIOVERSION N/A 12/26/2015   Procedure: TRANSESOPHAGEAL ECHOCARDIOGRAM (TEE);  Surgeon: Dalia Heading, MD;  Location: [facility] ORS;  Service: Cardiovascular;  Laterality: N/A;  . TONSILLECTOMY AND ADENOIDECTOMY      SOCIAL HISTORY:   Social History   Tobacco Use  . Smoking status: Former Games developer  . Smokeless tobacco: Never Used  Substance Use Topics  . Alcohol use: Yes    FAMILY HISTORY:   Family History  Problem Relation Age of Onset   . Heart disease Mother   . Heart attack Mother   . Hypertension Mother   . Heart disease Father   . Heart attack Father   . Hypertension Father   . Heart disease Brother   . Stroke Brother   . Heart attack Other     DRUG ALLERGIES:   Allergies  Allergen Reactions  . Enalapril Other (See Comments) and Shortness Of Breath    Unknown chest tightness  . Prednisone Palpitations    REVIEW OF SYSTEMS:  CONSTITUTIONAL: No fever, fatigue or weakness.  EYES: No blurred or double vision.  EARS, NOSE, AND THROAT: No tinnitus or ear pain. No sore throat RESPIRATORY: No cough, shortness of breath, wheezing or hemoptysis.  CARDIOVASCULAR: No chest pain, orthopnea, edema.  GASTROINTESTINAL: No nausea, vomiting, diarrhea or abdominal pain. No blood in bowel movements GENITOURINARY: No dysuria, hematuria.  ENDOCRINE: No polyuria, nocturia,  HEMATOLOGY: No anemia, easy bruising or bleeding SKIN: No rash or lesion. MUSCULOSKELETAL: No joint pain or arthritis.  Positive for pain in the face. NEUROLOGIC: No tingling, numbness, weakness.  PSYCHIATRY: No anxiety or depression.   MEDICATIONS AT HOME:   Prior to Admission medications   Medication Sig Start Date End Date Taking? Authorizing Provider  acetaminophen (TYLENOL) 325 MG tablet Take 650 mg by mouth every 6 (six) hours as needed.    [provider]  allopurinol (ZYLOPRIM) 100 MG tablet Take 200 mg by mouth daily. 07/26/19   [provider]  aspirin EC 81 MG tablet Take 81 mg by mouth daily.    [provider]  atorvastatin (LIPITOR) 40 MG tablet Take 1 tablet (40 mg total) by mouth daily at 6 PM. Patient taking differently: Take 40 mg by mouth every morning.  12/23/15   Shaune Pollack, MD  clopidogrel (PLAVIX) 75 MG tablet Take 1 tablet (75 mg total) by mouth daily. 12/23/15   Shaune Pollack, MD  colchicine 0.6 MG tablet Take 1 tablet (0.6 mg total) by mouth 2 (two) times daily. 06/04/17   Auburn Bilberry, MD  diltiazem  (CARDIZEM) 90 MG tablet Take 90 mg by mouth 2 (two) times daily.    [provider]  furosemide (LASIX) 40 MG tablet Take 40 mg by mouth daily. 07/23/19   [provider]  hydrochlorothiazide (HYDRODIURIL) 25 MG tablet Take 25 mg by mouth daily.    [provider]  levETIRAcetam (KEPPRA) 500 MG tablet Take 500 mg by mouth 2 (two) times daily. 07/26/19   [provider]  losartan (COZAAR) 100 MG tablet Take 100 mg by mouth daily. 07/23/19   [provider]  metFORMIN (GLUCOPHAGE) 1000 MG tablet Take 1 tablet (1,000 mg total) by mouth 2 (two) times daily with a meal. 12/28/15   Delfino Lovett, MD  oxybutynin (DITROPAN) 5 MG tablet Take 5 mg by mouth 2 (two) times daily.    [provider]  polyethylene glycol (MIRALAX / GLYCOLAX) packet Take 17 g by mouth daily. Patient taking differently: Take 17 g by mouth daily as needed for moderate constipation.  12/26/15   Delfino Lovett, MD  potassium chloride SA (K-DUR,KLOR-CON) 20 MEQ tablet Take 1 tablet (20 mEq total) by mouth daily. 06/10/17   Milagros Loll, MD      VITAL SIGNS:  Blood pressure (!) 143/68, pulse (!) 45, temperature 97.7 F (36.5 C), temperature source Oral, resp. rate (!) 21, height 5\' 3"  (1.6 m), weight 90 kg, SpO2 100 %.  PHYSICAL EXAMINATION:  GENERAL:  73 y.o.-year-old patient lying in the bed with no acute distress.  EYES: Pupils equal, round, reactive to light and accommodation. No scleral icterus. Extraocular muscles intact.  Swelling around the eyelids.  Bruising right face HEENT: Head atraumatic, normocephalic. Oropharynx and nasopharynx clear.  NECK:  Supple, no jugular venous distention. No thyroid enlargement, no tenderness.  LUNGS: Normal breath sounds bilaterally, no wheezing, rales,rhonchi or crepitation. No use of accessory muscles of respiration.  CARDIOVASCULAR: S1, S2 bradycardic. No murmurs, rubs, or gallops.  ABDOMEN: Soft, nontender, nondistended. Bowel sounds present.  No organomegaly or mass.  EXTREMITIES: No pedal edema, cyanosis, or clubbing.  NEUROLOGIC: Cranial nerves II through XII are intact. Muscle strength 5/5 in all extremities. Sensation intact. Gait not checked.  PSYCHIATRIC: The patient is alert and oriented x 3.  SKIN: Bruising right face.  Swelling around the right eyelid  LABORATORY PANEL:   CBC Recent Labs  Lab 08/05/19 1159  WBC 5.2  HGB 12.8  HCT 39.8  PLT 193   ------------------------------------------------------------------------------------------------------------------  Chemistries  Recent Labs  Lab 08/05/19 1159  NA 141  K 3.8  CL 104  CO2 28  GLUCOSE 105*  BUN 23  CREATININE 0.92  CALCIUM 9.8   ------------------------------------------------------------------------------------------------------------------  RADIOLOGY:  Ct Head Wo Contrast  Result Date: 08/05/2019 CLINICAL DATA:  Fall, right head swelling EXAM: CT HEAD WITHOUT CONTRAST TECHNIQUE: Contiguous axial images were obtained from  the base of the skull through the vertex without intravenous contrast. COMPARISON:  June 09, 2017 FINDINGS: Brain: There is small volume subarachnoid hemorrhage within a left frontal sulcus. There is an additional small focus of hemorrhage adjacent to or involving the body of the fornix on the right. No mass effect or edema. No new loss of gray-white differentiation. Confluent areas of hypoattenuation in the supratentorial white matter likely reflect advanced chronic microvascular ischemic changes. There are chronic basal ganglia infarcts. There is no extra-axial fluid collection. Prominence of the ventricles and sulci reflects stable parenchymal volume loss. Vascular: There is atherosclerotic calcification at the skull base. Skull: Calvarium is intact. Sinuses/Orbits: No acute finding. Other: None. IMPRESSION: Trace left sulcal acute subarachnoid hemorrhage. Small volume hemorrhage involving or adjacent to the body of the  fornix on the right. Additional chronic findings detailed above. These results will be called to the ordering clinician or representative by the Radiologist Assistant, and communication documented in the PACS or zVision Dashboard. Electronically Signed   By: Guadlupe SpanishPraneil  Patel M.D.   On: 08/05/2019 12:39   Ct Cervical Spine Wo Contrast  Result Date: 08/05/2019 CLINICAL DATA:  Fall, syncopal event. Subarachnoid hemorrhage on head CT. EXAM: CT CERVICAL SPINE WITHOUT CONTRAST TECHNIQUE: Multidetector CT imaging of the cervical spine was performed without intravenous contrast. Multiplanar CT image reconstructions were also generated. COMPARISON:  None. FINDINGS: Alignment: Straightening of normal lordosis. No traumatic subluxation. Trace anterolisthesis of C4 on C5 appears degenerative and facet mediated. Skull base and vertebrae: No acute fracture. Vertebral body heights are maintained. The dens and skull base are intact. Incidental small hemangioma within posterior aspect of C7 vertebral body. Soft tissues and spinal canal: No prevertebral fluid or swelling. No visible canal hematoma. Disc levels: Mild multilevel degenerative disc disease with disc space narrowing and endplate spurring. Multilevel facet hypertrophy. Partial bony ankylosis of C6-C7 on the right is likely degenerative. Upper chest: No acute findings. Other: None. IMPRESSION: Mild degenerative change in the cervical spine without acute fracture or subluxation. Electronically Signed   By: Narda RutherfordMelanie  Sanford M.D.   On: 08/05/2019 13:58   Ct Maxillofacial Wo Cm  Result Date: 08/05/2019 CLINICAL DATA:  Syncopal episode with right head swelling. EXAM: CT MAXILLOFACIAL WITHOUT CONTRAST TECHNIQUE: Multidetector CT imaging of the maxillofacial structures was performed. Multiplanar CT image reconstructions were also generated. COMPARISON:  Head CT earlier this day. FINDINGS: Osseous: Zygomatic arches, nasal bone, and mandibles are intact. Temporomandibular  joints are congruent. Degenerative change of the left temporomandibular joint. Scattered missing teeth and dental caries. Orbits: Both orbits and globes are intact. No acute orbital fracture. Sinuses: No sinus fracture or fluid level. Trace mucosal thickening and right maxillary sinus scattered ethmoid air cells. The mastoid air cells are clear. Soft tissues: Soft tissue edema overlies the right aspect of the face. No soft tissue air or radiopaque foreign body. Limited intracranial: Known subarachnoid hemorrhage is not included in the field of view. IMPRESSION: No facial bone fracture. Soft tissue edema of the right aspect of the face. Electronically Signed   By: Narda RutherfordMelanie  Sanford M.D.   On: 08/05/2019 14:01    EKG:  Sinus bradycardia.  Q waves septally   IMPRESSION AND PLAN:   1.  Traumatic subarachnoid hemorrhage with brief loss of consciousness.  Patient on Plavix and aspirin as outpatient.  Hold blood thinners at this time.  Neurochecks every 4 hours.  Repeat CT scan in 6 hours from previous.  Physical therapy evaluation.  Empiric Keppra. 2.  Syncope,  bradycardia.  Hold Cardizem.  Check orthostatic vitals.  Gentle IV fluid hydration.  Will get EEG, echo, carotid ultrasound and monitor on telemetry. 3.  Hypertension continue hydrochlorothiazide.  Hold diltiazem 4.  Stroke.  Stroke risk higher being off aspirin and Plavix. 5.  Type 2 diabetes mellitus.  Put on sliding scale.  Patient on Metformin 6.  Hyperlipidemia on Lipitor  All the records are reviewed and case discussed with ED provider. Management plans discussed with the patient, family and they are in agreement.  CODE STATUS: Full code  TOTAL TIME TAKING CARE OF THIS PATIENT: 50 minutes.    Loletha Grayer M.D on 08/05/2019 at 4:36 PM  Between 7am to 6pm - Pager - [phone]  After 6pm call admission pager [phone]  [facility]  CC: Primary care physician; Idelle Crouch, MD

## 2019-08-05 NOTE — ED Notes (Signed)
This RN spoke with son, Shaunte Tuft who resides with patient; states syncopal episode occurred at approximately 1030.  Reports hx of same, states patient was at baseline prior to episode this morning.

## 2019-08-05 NOTE — ED Notes (Signed)
Pt attempting to provide urine sample. Will complete I&O cath if needed.

## 2019-08-05 NOTE — ED Notes (Signed)
Pt's son Beverely Low updated with verbal okay from pt.

## 2019-08-05 NOTE — ED Notes (Signed)
Pt reoriented and updated. Will call Beverely Low to notify of bed assignment as requested earlier today.

## 2019-08-05 NOTE — ED Notes (Addendum)
Some confusion noted throughout remainder of triage.  Pt unable to recall time of incident this morning, stating "1999."  EDP, Quale notified.  See new orders.

## 2019-08-05 NOTE — ED Notes (Signed)
Patient transported to CT 

## 2019-08-05 NOTE — ED Notes (Signed)
Pt given 1 cup of OJ with med.

## 2019-08-05 NOTE — ED Notes (Signed)
Pt leaving for CT.  

## 2019-08-06 ENCOUNTER — Inpatient Hospital Stay: Payer: Medicare Other

## 2019-08-06 ENCOUNTER — Inpatient Hospital Stay
Admit: 2019-08-06 | Discharge: 2019-08-06 | Disposition: A | Discharge status: Home / Self Care | Payer: Medicare Other | Attending: Internal Medicine | Admitting: Internal Medicine

## 2019-08-06 DIAGNOSIS — R569 Unspecified convulsions: Secondary | ICD-10-CM

## 2019-08-06 LAB — CBC
HCT: 37.4 % (ref 36.0–46.0)
Hemoglobin: 12.9 g/dL (ref 12.0–15.0)
MCH: 29.5 pg (ref 26.0–34.0)
MCHC: 34.5 g/dL (ref 30.0–36.0)
MCV: 85.4 fL (ref 80.0–100.0)
Platelets: 211 10*3/uL (ref 150–400)
RBC: 4.38 MIL/uL (ref 3.87–5.11)
RDW: 12.4 % (ref 11.5–15.5)
WBC: 6.2 10*3/uL (ref 4.0–10.5)
nRBC: 0 % (ref 0.0–0.2)

## 2019-08-06 LAB — BASIC METABOLIC PANEL
Anion gap: 8 (ref 5–15)
BUN: 16 mg/dL (ref 8–23)
CO2: 26 mmol/L (ref 22–32)
Calcium: 9.6 mg/dL (ref 8.9–10.3)
Chloride: 107 mmol/L (ref 98–111)
Creatinine, Ser: 0.7 mg/dL (ref 0.44–1.00)
GFR calc Af Amer: >60 mL/min (ref >60)
GFR calc non Af Amer: >60 mL/min (ref >60)
Glucose, Bld: 94 mg/dL (ref 70–99)
Potassium: 3.7 mmol/L (ref 3.5–5.1)
Sodium: 141 mmol/L (ref 135–145)

## 2019-08-06 LAB — ECHOCARDIOGRAM COMPLETE
Height: 63 in
Weight: 2910.4 oz

## 2019-08-06 LAB — GLUCOSE, CAPILLARY
Glucose-Capillary: 105 mg/dL — ABNORMAL HIGH (ref 70–99)
Glucose-Capillary: 108 mg/dL — ABNORMAL HIGH (ref 70–99)
Glucose-Capillary: 103 mg/dL — ABNORMAL HIGH (ref 70–99)
Glucose-Capillary: 127 mg/dL — ABNORMAL HIGH (ref 70–99)

## 2019-08-06 LAB — SARS CORONAVIRUS 2 (TAT 6-24 HRS): SARS Coronavirus 2: NEGATIVE

## 2019-08-06 NOTE — Consult Note (Signed)
Reason for Consult: [facility] Referring Physician: Dr. Renae Gloss  CC: SAH  HPI: Emily [name] is an 73 y.o. female with past medical hx of stroke, syncope and collapse, HTN, DM, bradycardia and HLD presenting s/p syncopal event/fall and was found to have L sided  traumatic SAH.    Past Medical History:  Diagnosis Date  . Essential hypertension, benign   . Gout, unspecified   . Mixed hyperlipidemia   . Stroke [facility])    march 2017  . Syncope and collapse   . Type II or unspecified type diabetes mellitus without mention of complication, uncontrolled     Past Surgical History:  Procedure Laterality Date  . ABDOMINAL HYSTERECTOMY    . TEE WITHOUT CARDIOVERSION N/A 12/26/2015   Procedure: TRANSESOPHAGEAL ECHOCARDIOGRAM (TEE);  Surgeon: Dalia Heading, MD;  Location: [facility] ORS;  Service: Cardiovascular;  Laterality: N/A;  . TONSILLECTOMY AND ADENOIDECTOMY      Family History  Problem Relation Age of Onset  . Heart disease Mother   . Heart attack Mother   . Hypertension Mother   . Heart disease Father   . Heart attack Father   . Hypertension Father   . Heart disease Brother   . Stroke Brother   . Heart attack Other     Social History:  reports that she has quit smoking. She has never used smokeless tobacco. She reports current alcohol use. She reports that she does not use drugs.  Allergies  Allergen Reactions  . Enalapril Other (See Comments) and Shortness Of Breath    Unknown chest tightness  . Prednisone Palpitations    Medications: I have reviewed the patient's current medications.  ROS: Unable to obtain as periods of confusion   Physical Examination: Blood pressure (!) 142/64, pulse (!) 58, temperature 98.2 F (36.8 C), temperature source Oral, resp. rate 17, height  (1.6 m), weight 82.5 kg, SpO2 100 %.    Neurological Examination   Mental Status: Alert, oriented to name and location. Confused on the date.  Cranial Nerves: II: Discs flat bilaterally;  difficulty to access visual fields asswelling around R eye.  V,VII: smile symmetric, facial light touch sensation normal bilaterally VIII: hearing normal bilaterally IX,X: gag reflex present XI: bilateral shoulder shrug XII: midline tongue extension Motor: Right : Upper extremity   5/5    Left:     Upper extremity   5/5  Lower extremity   5/5     Lower extremity   5/5 Tone and bulk:normal tone throughout; no atrophy noted Sensory: Pinprick and light touch intact throughout, bilaterally Deep Tendon Reflexes: 1+ and symmetric throughout Plantars: Right: downgoing   Left: downgoing Cerebellar: normal finger-to-nose      Laboratory Studies:   Basic Metabolic Panel: Recent Labs  Lab 08/05/19 1159 08/06/19 0629  NA 141 141  K 3.8 3.7  CL 104 107  CO2 28 26  GLUCOSE 105* 94  BUN 23 16  CREATININE 0.92 0.70  CALCIUM 9.8 9.6    Liver Function Tests: No results for input(s): AST, ALT, ALKPHOS, BILITOT, PROT, ALBUMIN in the last 168 hours. No results for input(s): LIPASE, AMYLASE in the last 168 hours. No results for input(s): AMMONIA in the last 168 hours.  CBC: Recent Labs  Lab 08/05/19 1159 08/06/19 0629  WBC 5.2 6.2  HGB 12.8 12.9  HCT 39.8 37.4  MCV 88.2 85.4  PLT 193 211    Cardiac Enzymes: No results for input(s): CKTOTAL, CKMB, CKMBINDEX, TROPONINI in the last 168 hours.  BNP: Invalid input(s): POCBNP  CBG: Recent Labs  Lab 08/05/19 1748 08/05/19 1829 08/05/19 2219 08/06/19 0809 08/06/19 1149  GLUCAP 64* 81 94 105* 108*    Microbiology: Results for orders placed or performed during the hospital encounter of 08/05/19  SARS CORONAVIRUS 2 (TAT 6-24 HRS) Nasopharyngeal Nasopharyngeal Swab     Status: None   Collection Time: 08/05/19  1:30 PM   Specimen: Nasopharyngeal Swab  Result Value Ref Range Status   SARS Coronavirus 2 NEGATIVE NEGATIVE Final    Comment: (NOTE) SARS-CoV-2 target nucleic acids are NOT DETECTED. The SARS-CoV-2 RNA is  generally detectable in upper and lower respiratory specimens during the acute phase of infection. Negative results do not preclude SARS-CoV-2 infection, do not rule out co-infections with other pathogens, and should not be used as the sole basis for treatment or other patient management decisions. Negative results must be combined with clinical observations, patient history, and epidemiological information. The expected result is Negative. Fact Sheet for Patients: SugarRoll.be Fact Sheet for Healthcare Providers: https://www.woods-mathews.com/ This test is not yet approved or cleared by the [place] FDA and  has been authorized for detection and/or diagnosis of SARS-CoV-2 by FDA under an Emergency Use Authorization (EUA). This EUA will remain  in effect (meaning this test can be used) for the duration of the COVID-19 declaration under Section 56 4(b)(1) of the Act, 21 U.S.C. section 360bbb-3(b)(1), unless the authorization is terminated or revoked sooner. Performed at [facility], [place] [address] [place], [place] [address]     Coagulation Studies: No results for input(s): LABPROT, INR in the last 72 hours.  Urinalysis:  Recent Labs  Lab 08/05/19 1330  COLORURINE YELLOW*  LABSPEC 1.012  PHURINE 7.0  GLUCOSEU NEGATIVE  HGBUR NEGATIVE  BILIRUBINUR NEGATIVE  KETONESUR NEGATIVE  PROTEINUR NEGATIVE  NITRITE NEGATIVE  LEUKOCYTESUR NEGATIVE    Lipid Panel:     Component Value Date/Time   CHOL 132 06/09/2017 1110   TRIG 121 06/09/2017 1110   HDL 40 (L) 06/09/2017 1110   CHOLHDL 3.3 06/09/2017 1110   VLDL 24 06/09/2017 1110   LDLCALC 68 06/09/2017 1110    HgbA1C:  Lab Results  Component Value Date   HGBA1C 5.5 08/05/2019    Urine Drug Screen:  No results found for: LABOPIA, COCAINSCRNUR, LABBENZ, AMPHETMU, THCU, LABBARB  Alcohol Level: No results for input(s): ETH in the last 168 hours.   Imaging: Ct Head Wo  Contrast  Result Date: 08/05/2019 CLINICAL DATA:  Follow-up subarachnoid hemorrhage EXAM: CT HEAD WITHOUT CONTRAST TECHNIQUE: Contiguous axial images were obtained from the base of the skull through the vertex without intravenous contrast. COMPARISON:  Films from earlier in the same day. FINDINGS: Brain: Chronic atrophic and ischemic changes are again seen and stable. Persistent infarcts are noted in the basal ganglia bilaterally stable from the recent exam. Stable areas of hemorrhage are noted along the fornix on the right in the midline as well as within a left frontal sulcus. The overall appearance is stable from the prior exam. No new hemorrhage is seen. Vascular: No hyperdense vessel or unexpected calcification. Skull: Normal. Negative for fracture or focal lesion. Sinuses/Orbits: No acute finding. Other: None. IMPRESSION: Stable areas of hemorrhage in a left frontal sulcus as well as just to the right of the midline along the septum pellucidum. No significant increase in hemorrhage is seen. No new hemorrhage is noted. Chronic atrophic and ischemic changes similar to that noted on the prior exam. Electronically Signed   By:  Alcide CleverMark  Lukens M.D.   On: 08/05/2019 19:15   Ct Head Wo Contrast  Result Date: 08/05/2019 CLINICAL DATA:  Fall, right head swelling EXAM: CT HEAD WITHOUT CONTRAST TECHNIQUE: Contiguous axial images were obtained from the base of the skull through the vertex without intravenous contrast. COMPARISON:  June 09, 2017 FINDINGS: Brain: There is small volume subarachnoid hemorrhage within a left frontal sulcus. There is an additional small focus of hemorrhage adjacent to or involving the body of the fornix on the right. No mass effect or edema. No new loss of gray-white differentiation. Confluent areas of hypoattenuation in the supratentorial white matter likely reflect advanced chronic microvascular ischemic changes. There are chronic basal ganglia infarcts. There is no extra-axial  fluid collection. Prominence of the ventricles and sulci reflects stable parenchymal volume loss. Vascular: There is atherosclerotic calcification at the skull base. Skull: Calvarium is intact. Sinuses/Orbits: No acute finding. Other: None. IMPRESSION: Trace left sulcal acute subarachnoid hemorrhage. Small volume hemorrhage involving or adjacent to the body of the fornix on the right. Additional chronic findings detailed above. These results will be called to the ordering clinician or representative by the Radiologist Assistant, and communication documented in the PACS or zVision Dashboard. Electronically Signed   By: Guadlupe SpanishPraneil  Patel M.D.   On: 08/05/2019 12:39   Ct Cervical Spine Wo Contrast  Result Date: 08/05/2019 CLINICAL DATA:  Fall, syncopal event. Subarachnoid hemorrhage on head CT. EXAM: CT CERVICAL SPINE WITHOUT CONTRAST TECHNIQUE: Multidetector CT imaging of the cervical spine was performed without intravenous contrast. Multiplanar CT image reconstructions were also generated. COMPARISON:  None. FINDINGS: Alignment: Straightening of normal lordosis. No traumatic subluxation. Trace anterolisthesis of C4 on C5 appears degenerative and facet mediated. Skull base and vertebrae: No acute fracture. Vertebral body heights are maintained. The dens and skull base are intact. Incidental small hemangioma within posterior aspect of C7 vertebral body. Soft tissues and spinal canal: No prevertebral fluid or swelling. No visible canal hematoma. Disc levels: Mild multilevel degenerative disc disease with disc space narrowing and endplate spurring. Multilevel facet hypertrophy. Partial bony ankylosis of C6-C7 on the right is likely degenerative. Upper chest: No acute findings. Other: None. IMPRESSION: Mild degenerative change in the cervical spine without acute fracture or subluxation. Electronically Signed   By: Narda RutherfordMelanie  Sanford M.D.   On: 08/05/2019 13:58   Ct Maxillofacial Wo Cm  Result Date:  08/05/2019 CLINICAL DATA:  Syncopal episode with right head swelling. EXAM: CT MAXILLOFACIAL WITHOUT CONTRAST TECHNIQUE: Multidetector CT imaging of the maxillofacial structures was performed. Multiplanar CT image reconstructions were also generated. COMPARISON:  Head CT earlier this day. FINDINGS: Osseous: Zygomatic arches, nasal bone, and mandibles are intact. Temporomandibular joints are congruent. Degenerative change of the left temporomandibular joint. Scattered missing teeth and dental caries. Orbits: Both orbits and globes are intact. No acute orbital fracture. Sinuses: No sinus fracture or fluid level. Trace mucosal thickening and right maxillary sinus scattered ethmoid air cells. The mastoid air cells are clear. Soft tissues: Soft tissue edema overlies the right aspect of the face. No soft tissue air or radiopaque foreign body. Limited intracranial: Known subarachnoid hemorrhage is not included in the field of view. IMPRESSION: No facial bone fracture. Soft tissue edema of the right aspect of the face. Electronically Signed   By: Narda RutherfordMelanie  Sanford M.D.   On: 08/05/2019 14:01     Assessment/Plan:  73 y.o. female with past medical hx of stroke, syncope and collapse, HTN, DM, bradycardia and HLD presenting s/p syncopal event/fall and was  found to have L sided  traumatic SAH.  - CTH repeat done about 6 hrs post first one no new abnormalities or signs of hemorrhage - SAH will get reabsorbed, no need for surgical intervention. No in vascular territory to suggest aneurism.  - no need to repeat further imaging unless examination changes - Keppra was started for seizure prophylaxis, would only leave it on for 7 days unless active seizures - Hep sq can be started tomorrow.   08/06/2019, 12:20 PM

## 2019-08-06 NOTE — Progress Notes (Signed)
*  PRELIMINARY RESULTS* Echocardiogram 2D Echocardiogram has been performed.  Emily [name] [name] 08/06/2019, 9:05 AM

## 2019-08-06 NOTE — Procedures (Signed)
Patient Name: Emily [name]  MRN: [id number]  Epilepsy Attending: Lora Havens  Referring Physician/Provider: Dr Loletha Grayer Date: 08/06/2019 Duration: 24.06 mins  Patient history: 73yo F with left traumatic SAH. EEG to evaluate for seizure  Level of alertness: awake, asleep  AEDs during EEG study: LEV  Technical aspects: This EEG study was done with scalp electrodes positioned according to the 10-20 International system of electrode placement. Electrical activity was acquired at a sampling rate of 500Hz  and reviewed with a high frequency filter of 70Hz  and a low frequency filter of 1Hz . EEG data were recorded continuously and digitally stored.   DESCRIPTION: During awake state, the posterior dominant rhythm consists of 9-10 Hz activity of moderate voltage (25-35 uV) seen predominantly in posterior head regions, symmetric and reactive to eye opening and eye closing. Sleep was characterized by vertex waves, sleep spindles (12-14hz ), maximal fronto central and polymorphic 3-5hz  theta-delta slowing. There was also continuous 3-5hz  theta-delta slowing in left hemisphere. Physiologic photic driving was not seen during photic stimulation. Hyperventilation was not performed.  ABNORMALITY - Continuous slow, left hemisphere  IMPRESSION: This study is suggestive of cortical dysfunction in left hemisphere likely secondary to underlying SAH. No seizures or epileptiform discharges were seen throughout the recording.  Hannalee Castor Barbra Sarks

## 2019-08-06 NOTE — Evaluation (Signed)
Physical Therapy Evaluation Patient Details Name: Emily [name] MRN: [id number] DOB: 1946/03/08 Today's Date: 08/06/2019   History of Present Illness  73 y.o. female coming into the hospital after a blackout.  She states that she had no symptoms.  She woke up on the floor.  No tongue bite, no urination on herself or bowel movement on herself.  Her son witnessed and did not see any shaking episodes.  She feels okay now.  She just stood up and ended up on the ground.  As per family, she has done this numerous times before.  In the ER, she complains of some pain in her right face.  A CT scan showed a small subarachnoid hemorrhage.  past medical hx of stroke, syncope and collapse, HTN, DM, bradycardia and HLD   Clinical Impression  Pt was confident that she could walk in the hallway and was determined to do so.  She had slow, cautious gait but was able to ambulate ~75 ft with walker, with single UE support and the last 20+ft w/o UEs.  She had no LOBs or safety concerns though she does admit that she is slower and less confident than normal. PT encouraged her to use the cane initially at home, recommending HHPT.  Pt appears to have some minimal change in mentation but was alert and able to answer most questions appropriately (did report no falls despite being here for a fall, slow to respond with some questions).    Follow Up Recommendations Home health PT    Equipment Recommendations  None recommended by PT    Recommendations for Other Services       Precautions / Restrictions Precautions Precautions: Fall Restrictions Weight Bearing Restrictions: No      Mobility  Bed Mobility Overal bed mobility: Modified Independent             General bed mobility comments: Pt was very slow in getting to EOB, but determined to do it w/o assist.  She ultiamtely was able to attain sitting w/o direct assist.  Definite need of extra time and some cuing.  Transfers Overall transfer level:  Modified independent Equipment used: Rolling walker (2 wheeled)             General transfer comment: Pt struggled to get set up with appropriate UE positioning to be able to use effecitvely, but again keen to do it on her own.  Pt did not make it to standing on first attempt but with light cuing was able to on second attempt with out a struggle  Ambulation/Gait Ambulation/Gait assistance: Min guard Gait Distance (Feet): 75 Feet Assistive device: Rolling walker (2 wheeled);None;1 person hand held assist       General Gait Details: ~30 ft with walker, 25 ft with hand rail and 20 ft w/o AD.  Pt with slow but steady gait and no LOBs or overt safety concerns.  Her vitals were stable t/o the effort and though she endorses some fatigue ultimately did well and reports not being too far from her baseline.  slow and cautious ambulation, more so w/o UE support  Stairs            Wheelchair Mobility    Modified Rankin (Stroke Patients Only)       Balance Overall balance assessment: Needs assistance Sitting-balance support: No upper extremity supported;Feet supported Sitting balance-Leahy Scale: Good     Standing balance support: No upper extremity supported Standing balance-Leahy Scale: Fair  Pertinent Vitals/Pain Pain Assessment: No/denies pain    Home Living Family/patient expects to be discharged to:: Private residence Living Arrangements: Children Available Help at Discharge: Family Type of Home: Apartment Home Access: Elevator;Level entry     Home Layout: One level Home Equipment: Cane - single point;Walker - 2 wheels      Prior Function Level of Independence: Independent         Comments: Pt reports that sometimes she will use a cane, but normally is able to do in home activities w/o assist and gets out of the home a few times a month     Hand Dominance        Extremity/Trunk Assessment   Upper Extremity  Assessment Upper Extremity Assessment: Overall WFL for tasks assessed;Generalized weakness(minimal chronic L sided weakness but functional t/o)    Lower Extremity Assessment Lower Extremity Assessment: Overall WFL for tasks assessed;Generalized weakness(minimal chronic L sided weakness but functional t/o)       Communication   Communication: No difficulties  Cognition Arousal/Alertness: Awake/alert Behavior During Therapy: WFL for tasks assessed/performed Overall Cognitive Status: Impaired/Different from baseline                                 General Comments: Pt able to answer questions appropriate though at times slow, sister present and reports that she is not quite herself but nearing baseline      General Comments      Exercises     Assessment/Plan    PT Assessment Patient needs continued PT services  PT Problem List Decreased strength;Decreased range of motion;Decreased activity tolerance;Decreased balance;Decreased mobility;Decreased cognition;Decreased safety awareness;Decreased knowledge of use of DME       PT Treatment Interventions DME instruction;Gait training;Stair training;Functional mobility training;Therapeutic activities;Therapeutic exercise;Balance training;Cognitive remediation    PT Goals (Current goals can be found in the Care Plan section)  Acute Rehab PT Goals Patient Stated Goal: go home PT Goal Formulation: With patient Time For Goal Achievement: 08/20/19 Potential to Achieve Goals: Good    Frequency Min 2X/week   Barriers to discharge        Co-evaluation               AM-PAC PT "6 Clicks" Mobility  Outcome Measure Help needed turning from your back to your side while in a flat bed without using bedrails?: None Help needed moving from lying on your back to sitting on the side of a flat bed without using bedrails?: A Little Help needed moving to and from a bed to a chair (including a wheelchair)?: A Little Help  needed standing up from a chair using your arms (e.g., wheelchair or bedside chair)?: A Little Help needed to walk in hospital room?: A Little Help needed climbing 3-5 steps with a railing? : A Little 6 Click Score: 19    End of Session Equipment Utilized During Treatment: Gait belt Activity Tolerance: Patient limited by fatigue;Patient tolerated treatment well Patient left: with chair alarm set;with call bell/phone within reach Nurse Communication: Mobility status PT Visit Diagnosis: Muscle weakness (generalized) (M62.81);Difficulty in walking, not elsewhere classified (R26.2)    Time: 5809-9833 PT Time Calculation (min) (ACUTE ONLY): 20 min   Charges:   PT Evaluation $PT Eval Low Complexity: 1 Low PT Treatments $Gait Training: 8-22 mins        Kreg Shropshire, DPT 08/06/2019, 5:34 PM

## 2019-08-06 NOTE — Plan of Care (Signed)

## 2019-08-06 NOTE — Progress Notes (Signed)
PT Cancellation Note  Patient Details Name: Emily [name] MRN: [id number] DOB: 05/25/1946   Cancelled Treatment:    Reason Eval/Treat Not Completed: Patient at procedure or test/unavailable Chart reviewed, attempted to see pt she was out of room.  Will try back later.  Kreg Shropshire, DPT 08/06/2019, 12:41 PM

## 2019-08-06 NOTE — Progress Notes (Addendum)
BRIEF OVERNIGHT REPORT  SUBJECTIVE: Per nursing staff, patient noted with change in neuro status, was apparently having difficulty with word finding.  OBJECTIVE: On arrival to the bedside, she was afebrile with blood pressure 135/99 mm Hg and pulse rate 51 beats/min. There were no obvious focal neurological deficits; she was alert and oriented to person, place, situation, month but not year. Mild expressive aphasia noted. Moves all extremities against gravity. No focal motor or sensory deficit noted on exam. Repeat NIHSS 4 improved from previous assessment.  ASSESSMENT: 73 y.o female with past medical hx of CVA, syncope and collapse, HTN, DM, bradycardia and HLD presenting with traumatic SAH s/p fall.  PLAN 1. Traumatic SAH-  Reported change in speech. On further eval, patient stable with improved NIHSS. - Hold off repeat CT head and continue to monitor for any worsening symptoms -Telemetry monitoring - Frequent neuro checks - Continue current management per treatment team.   Rufina Falco, DNP, CCRN, FNP-C [facility] Nurse Practitioner Between 7pm to 7am - Pager [phone] Actively using [employer] secure chat messaging  After 7am go to [email or web address] - [email or web address]  [facility] [employer]  [phone]

## 2019-08-06 NOTE — TOC Initial Note (Signed)
Transition of Care [facility]) - Initial/Assessment Note    Patient Details  Name: Emily [name] MRN: [id number] Date of Birth: Dec 11, 1945  Transition of [facility] CM/SW Contact:    Su Hilt, RN Phone Number: 08/06/2019, 10:37 AM  Clinical Narrative:                 Met with the patient at the bedside to discuss DC plan and needs She lives at home with her son and grand son, they provide transportation She had a stroke about 6 months ago and Friday had another She used [facility] in the past and wants to use them again She has a RW and a raised toilet at home She Will benefit from PT and OT at home [facility] has accepted. Will monitor for additional needs  Expected Discharge Plan: [place] Barriers to Discharge: Continued Medical Work up   Patient Goals and CMS Choice Patient states their goals for this hospitalization and ongoing recovery are:: get better CMS Medicare.gov Compare Post Acute Care list provided to:: Patient Choice offered to / list presented to : Patient  Expected Discharge Plan and Services Expected Discharge Plan: [place]   Discharge Planning Services: CM Consult Post Acute Care Choice: [place] arrangements for the past 2 months: Single Family Home                 DME Arranged: N/A         HH Arranged: PT, OT [place] Agency: [place] ([place]) Date HH Agency Contacted: 08/06/19 Time HH Agency Contacted: 1036 Representative spoke with at [place]: Corene Cornea  Prior Living Arrangements/Services Living arrangements for the past 2 months: [place] with:: Adult Children, Minor Children Patient language and need for interpreter reviewed:: Yes Do you feel safe going back to the place where you live?: Yes      Need for Family Participation in Patient Care: No (Comment) Care giver support system in place?: Yes (comment) Current home services: DME(RW and raised toilet seat) Criminal  Activity/Legal Involvement Pertinent to Current Situation/Hospitalization: No - Comment as needed  Activities of Daily Living      Permission Sought/Granted   Permission granted to share information with : Yes, Verbal Permission Granted              Emotional Assessment Appearance:: Appears stated age Attitude/Demeanor/Rapport: Engaged Affect (typically observed): Appropriate Orientation: : Oriented to Self, Oriented to Place, Oriented to  Time, Oriented to Situation Alcohol / Substance Use: Not Applicable Psych Involvement: No (comment)  Admission diagnosis:  Syncope [R55] Platelet inhibition due to Plavix [Z79.02] Traumatic subarachnoid hemorrhage with loss of consciousness of 30 minutes or less, initial encounter ([place]) [S06.6X1A] Syncope, unspecified syncope type [R55] Patient Active Problem List   Diagnosis Date Noted  . Traumatic subarachnoid hemorrhage with loss of consciousness of 30 minutes or less ([place]) 08/05/2019  . TIA (transient ischemic attack) 06/09/2017  . Gouty arthritis 06/03/2017  . Acute CVA (cerebrovascular accident) ([place]) 12/22/2015  . Syncope 12/21/2015  . Hyperlipidemia 06/23/2013  . Type 2 diabetes mellitus with hyperlipidemia ([place]) 06/23/2013  . HTN (hypertension) 06/23/2013  . Morbid obesity ([place]) 06/23/2013  . Abnormal ECG 06/23/2013   PCP:  Idelle Crouch, MD Pharmacy:   [facility] [phone] [address], [place] [address] Phone: [phone] Fax: [phone]  Social Determinants of Health (SDOH) Interventions    Readmission Risk Interventions No flowsheet data found.

## 2019-08-06 NOTE — Progress Notes (Signed)
Spoke with Darol Destine RN and patient is currently in EEG, RN to notify IV team of patient's return also if patient still needs another IV access

## 2019-08-06 NOTE — Progress Notes (Signed)
Unable to complete pt. Admission profile. Poor historian with expressive aphagia.

## 2019-08-06 NOTE — Progress Notes (Signed)
eeg completed ° °

## 2019-08-06 NOTE — Progress Notes (Signed)
Patient ID: Emily [name], female   DOB: 06-May-1946, 73 y.o.   MRN: [id number] [facility] PROGRESS NOTE  Emily [name] [id number]:[id number] DOB: April 27, 1946 DOA: 08/05/2019 PCP: Marguarite Arbour, MD  HPI/Subjective: Patient this morning was feeling okay.  Offered no complaints.  States her strength is okay.  Overnight there was noted that she had some slurring of some speech.  That is gone at this point.  Objective: Vitals:   08/06/19 0434 08/06/19 0807  BP: (!) 154/58 (!) 142/64  Pulse: (!) 50 (!) 58  Resp: 17 17  Temp: 98.5 F (36.9 C) 98.2 F (36.8 C)  SpO2: 100% 100%    Intake/Output Summary (Last 24 hours) at 08/06/2019 1224 Last data filed at 08/06/2019 0917 Gross per 24 hour  Intake 75.46 ml  Output 400 ml  Net -324.54 ml   Filed Weights   08/05/19 1143 08/06/19 0055  Weight: 90 kg 82.5 kg    ROS: Review of Systems  Constitutional: Negative for chills and fever.  Eyes: Negative for blurred vision.  Respiratory: Negative for cough and shortness of breath.   Cardiovascular: Negative for chest pain.  Gastrointestinal: Negative for abdominal pain, constipation, diarrhea, nausea and vomiting.  Genitourinary: Negative for dysuria.  Musculoskeletal: Negative for joint pain.  Neurological: Negative for dizziness and headaches.   Exam: Physical Exam  Constitutional: She is oriented to person, place, and time.  HENT:  Nose: No mucosal edema.  Mouth/Throat: No oropharyngeal exudate or posterior oropharyngeal edema.  Eyes: Pupils are equal, round, and reactive to light. Conjunctivae, EOM and lids are normal.  Swelling right eyelids  Neck: No JVD present. Carotid bruit is not present. No edema present. No thyroid mass and no thyromegaly present.  Cardiovascular: S1 normal and S2 normal. Exam reveals no gallop.  No murmur heard. Pulses:      Dorsalis pedis pulses are 2+ on the right side and 2+ on the left side.  Respiratory: No respiratory distress. She  has no wheezes. She has no rhonchi. She has no rales.  GI: Soft. Bowel sounds are normal. There is no abdominal tenderness.  Musculoskeletal:     Right ankle: She exhibits swelling.     Left ankle: She exhibits swelling.  Lymphadenopathy:    She has no cervical adenopathy.  Neurological: She is alert and oriented to person, place, and time. No cranial nerve deficit.  Skin: Skin is warm. Nails show no clubbing.  Swelling and bruising right side of the face.  Psychiatric: She has a normal mood and affect.      Data Reviewed: Basic Metabolic Panel: Recent Labs  Lab 08/05/19 1159 08/06/19 0629  NA 141 141  K 3.8 3.7  CL 104 107  CO2 28 26  GLUCOSE 105* 94  BUN 23 16  CREATININE 0.92 0.70  CALCIUM 9.8 9.6   LCBC: Recent Labs  Lab 08/05/19 1159 08/06/19 0629  WBC 5.2 6.2  HGB 12.8 12.9  HCT 39.8 37.4  MCV 88.2 85.4  PLT 193 211    CBG: Recent Labs  Lab 08/05/19 1748 08/05/19 1829 08/05/19 2219 08/06/19 0809 08/06/19 1149  GLUCAP 64* 81 94 105* 108*    Recent Results (from the past 240 hour(s))  SARS CORONAVIRUS 2 (TAT 6-24 HRS) Nasopharyngeal Nasopharyngeal Swab     Status: None   Collection Time: 08/05/19  1:30 PM   Specimen: Nasopharyngeal Swab  Result Value Ref Range Status   SARS Coronavirus 2 NEGATIVE NEGATIVE Final    Comment: (NOTE) SARS-CoV-2  target nucleic acids are NOT DETECTED. The SARS-CoV-2 RNA is generally detectable in upper and lower respiratory specimens during the acute phase of infection. Negative results do not preclude SARS-CoV-2 infection, do not rule out co-infections with other pathogens, and should not be used as the sole basis for treatment or other patient management decisions. Negative results must be combined with clinical observations, patient history, and epidemiological information. The expected result is Negative. Fact Sheet for Patients: HairSlick.no Fact Sheet for Healthcare  Providers: quierodirigir.com This test is not yet approved or cleared by the [place] FDA and  has been authorized for detection and/or diagnosis of SARS-CoV-2 by FDA under an Emergency Use Authorization (EUA). This EUA will remain  in effect (meaning this test can be used) for the duration of the COVID-19 declaration under Section 56 4(b)(1) of the Act, 21 U.S.C. section 360bbb-3(b)(1), unless the authorization is terminated or revoked sooner. Performed at [facility], [address] [place], [place] [address]      Studies: Ct Head Wo Contrast  Result Date: 08/05/2019 CLINICAL DATA:  Follow-up subarachnoid hemorrhage EXAM: CT HEAD WITHOUT CONTRAST TECHNIQUE: Contiguous axial images were obtained from the base of the skull through the vertex without intravenous contrast. COMPARISON:  Films from earlier in the same day. FINDINGS: Brain: Chronic atrophic and ischemic changes are again seen and stable. Persistent infarcts are noted in the basal ganglia bilaterally stable from the recent exam. Stable areas of hemorrhage are noted along the fornix on the right in the midline as well as within a left frontal sulcus. The overall appearance is stable from the prior exam. No new hemorrhage is seen. Vascular: No hyperdense vessel or unexpected calcification. Skull: Normal. Negative for fracture or focal lesion. Sinuses/Orbits: No acute finding. Other: None. IMPRESSION: Stable areas of hemorrhage in a left frontal sulcus as well as just to the right of the midline along the septum pellucidum. No significant increase in hemorrhage is seen. No new hemorrhage is noted. Chronic atrophic and ischemic changes similar to that noted on the prior exam. Electronically Signed   By: Alcide Clever M.D.   On: 08/05/2019 19:15   Ct Head Wo Contrast  Result Date: 08/05/2019 CLINICAL DATA:  Fall, right head swelling EXAM: CT HEAD WITHOUT CONTRAST TECHNIQUE: Contiguous axial images  were obtained from the base of the skull through the vertex without intravenous contrast. COMPARISON:  June 09, 2017 FINDINGS: Brain: There is small volume subarachnoid hemorrhage within a left frontal sulcus. There is an additional small focus of hemorrhage adjacent to or involving the body of the fornix on the right. No mass effect or edema. No new loss of gray-white differentiation. Confluent areas of hypoattenuation in the supratentorial white matter likely reflect advanced chronic microvascular ischemic changes. There are chronic basal ganglia infarcts. There is no extra-axial fluid collection. Prominence of the ventricles and sulci reflects stable parenchymal volume loss. Vascular: There is atherosclerotic calcification at the skull base. Skull: Calvarium is intact. Sinuses/Orbits: No acute finding. Other: None. IMPRESSION: Trace left sulcal acute subarachnoid hemorrhage. Small volume hemorrhage involving or adjacent to the body of the fornix on the right. Additional chronic findings detailed above. These results will be called to the ordering clinician or representative by the Radiologist Assistant, and communication documented in the PACS or zVision Dashboard. Electronically Signed   By: Guadlupe Spanish M.D.   On: 08/05/2019 12:39   Ct Cervical Spine Wo Contrast  Result Date: 08/05/2019 CLINICAL DATA:  Fall, syncopal event. Subarachnoid hemorrhage  on head CT. EXAM: CT CERVICAL SPINE WITHOUT CONTRAST TECHNIQUE: Multidetector CT imaging of the cervical spine was performed without intravenous contrast. Multiplanar CT image reconstructions were also generated. COMPARISON:  None. FINDINGS: Alignment: Straightening of normal lordosis. No traumatic subluxation. Trace anterolisthesis of C4 on C5 appears degenerative and facet mediated. Skull base and vertebrae: No acute fracture. Vertebral body heights are maintained. The dens and skull base are intact. Incidental small hemangioma within posterior aspect of  C7 vertebral body. Soft tissues and spinal canal: No prevertebral fluid or swelling. No visible canal hematoma. Disc levels: Mild multilevel degenerative disc disease with disc space narrowing and endplate spurring. Multilevel facet hypertrophy. Partial bony ankylosis of C6-C7 on the right is likely degenerative. Upper chest: No acute findings. Other: None. IMPRESSION: Mild degenerative change in the cervical spine without acute fracture or subluxation. Electronically Signed   By: Narda RutherfordMelanie  Sanford M.D.   On: 08/05/2019 13:58   Ct Maxillofacial Wo Cm  Result Date: 08/05/2019 CLINICAL DATA:  Syncopal episode with right head swelling. EXAM: CT MAXILLOFACIAL WITHOUT CONTRAST TECHNIQUE: Multidetector CT imaging of the maxillofacial structures was performed. Multiplanar CT image reconstructions were also generated. COMPARISON:  Head CT earlier this day. FINDINGS: Osseous: Zygomatic arches, nasal bone, and mandibles are intact. Temporomandibular joints are congruent. Degenerative change of the left temporomandibular joint. Scattered missing teeth and dental caries. Orbits: Both orbits and globes are intact. No acute orbital fracture. Sinuses: No sinus fracture or fluid level. Trace mucosal thickening and right maxillary sinus scattered ethmoid air cells. The mastoid air cells are clear. Soft tissues: Soft tissue edema overlies the right aspect of the face. No soft tissue air or radiopaque foreign body. Limited intracranial: Known subarachnoid hemorrhage is not included in the field of view. IMPRESSION: No facial bone fracture. Soft tissue edema of the right aspect of the face. Electronically Signed   By: Narda RutherfordMelanie  Sanford M.D.   On: 08/05/2019 14:01    Scheduled Meds: . atorvastatin  40 mg Oral BH-q7a  . colchicine  0.6 mg Oral Daily  . hydrochlorothiazide  25 mg Oral Daily  . insulin aspart  0-5 Units Subcutaneous QHS  . insulin aspart  0-9 Units Subcutaneous TID WC  . levETIRAcetam  500 mg Oral BID  .  oxybutynin  5 mg Oral BID  . potassium chloride SA  20 mEq Oral Daily   Continuous Infusions:  Assessment/Plan:  1. Traumatic subarachnoid hemorrhage with brief loss of consciousness.  Holding aspirin and Plavix.  Continue neurochecks.  Repeat CT scan last night did not show any worsening of the subarachnoid hemorrhage.  Physical therapy evaluation.  Patient on Keppra as outpatient and dose increased.  We will get neurology consultation. 2. Syncope, bradycardia.  Hold Cardizem.  Patient not orthostatic today.  Unfortunately orthostatic vital signs were not checked yesterday.  I do not think this was seizures since the patient did not bite her tongue or loss of bowel or bladder function.  We will get rid of IV fluid hydration.  Echocardiogram, carotid ultrasound.  EEG.  Neurology consultation. 3. Hypertension on hydrochlorothiazide.  Hold diltiazem secondary to bradycardia 4. Previous history of stroke.  Holding aspirin and Plavix.  Stroke risk higher being off aspirin and Plavix. 5. Hemoglobin A1c 5.6.  Discontinue Metformin. 6. Hyperlipidemia unspecified on atorvastatin 7. Seizure history on Keppra as outpatient  Code Status:     Code Status Orders  (From admission, onward)         Start     Ordered  08/05/19 1519  Full code  Continuous     08/05/19 1519        Code Status History    Date Active Date Inactive Code Status Order ID Comments User Context   06/09/2017 1649 06/10/2017 1915 Full Code [id number]  Vaughan Basta, MD Inpatient   06/03/2017 1611 06/04/2017 1749 Full Code [id number]  Gladstone Lighter, MD Inpatient   12/21/2015 2348 12/26/2015 2028 Full Code [id number]  Henreitta Leber, MD Inpatient   Advance Care Planning Activity     Family Communication: Patient asked that I contact her Sister Kennyth Lose.  Case discussed with her on the phone. Disposition Plan: To be determined based on clinical course  Consultants:  Neurology  Neurosurgery  Time spent: 28  minutes  [place]

## 2019-08-07 DIAGNOSIS — R001 Bradycardia, unspecified: Secondary | ICD-10-CM

## 2019-08-07 DIAGNOSIS — R410 Disorientation, unspecified: Secondary | ICD-10-CM

## 2019-08-07 LAB — PREPARE PLATELET PHERESIS
Unit division: 0
Unit division: 0
Unit division: 0

## 2019-08-07 LAB — GLUCOSE, CAPILLARY
Glucose-Capillary: 104 mg/dL — ABNORMAL HIGH (ref 70–99)
Glucose-Capillary: 151 mg/dL — ABNORMAL HIGH (ref 70–99)
Glucose-Capillary: 142 mg/dL — ABNORMAL HIGH (ref 70–99)
Glucose-Capillary: 114 mg/dL — ABNORMAL HIGH (ref 70–99)

## 2019-08-07 LAB — BPAM PLATELET PHERESIS
Blood Product Expiration Date: 202011132359
Blood Product Expiration Date: 202011142359
Blood Product Expiration Date: 202011142359
ISSUE DATE / TIME: 202011121421
Unit Type and Rh: 5100
Unit Type and Rh: 6200
Unit Type and Rh: 6200

## 2019-08-07 MED ORDER — SODIUM CHLORIDE 0.9% FLUSH
3.0000 mL | INTRAVENOUS | Status: DC | PRN
  Administered 2019-08-07: 3 mL via INTRAVENOUS

## 2019-08-07 MED ORDER — SODIUM CHLORIDE 0.9% FLUSH
3.0000 mL | Freq: Two times a day (BID) | INTRAVENOUS | Status: DC
  Administered 2019-08-07 – 2019-08-08 (×3): 3 mL via INTRAVENOUS

## 2019-08-07 NOTE — Progress Notes (Signed)
Patient ID: Emily [name], female   DOB: 1945-10-14, 73 y.o.   MRN: [id number] [facility] PROGRESS NOTE  Emily LoHelen H [name] NWG:[id number] DOB: 1945-10-14 DOA: 08/05/2019 PCP: Marguarite ArbourSparks, Jeffrey D, MD  HPI/Subjective: Patient physically felt okay.  Felt okay walking around the room with me and the nurse this morning.  Her pure wick catheter was not in the right spot and was leaking and the bed was wet.  Patient tells me a story that her son and grandson came to visit yesterday and they locked up her grandson.  I was concerned about this story and the nurse confirmed that she did sleep last night.  I spoke with the patient's sister and she confirmed that her mental status has not been good while in the hospital.  Objective: Vitals:   08/07/19 0806 08/07/19 0809  BP: (!) 146/74 140/72  Pulse: (!) 56 (!) 57  Resp: 20 20  Temp: (!) 97.5 F (36.4 C) 97.6 F (36.4 C)  SpO2: 100% 100%    Intake/Output Summary (Last 24 hours) at 08/07/2019 1513 Last data filed at 08/07/2019 1429 Gross per 24 hour  Intake 360 ml  Output 1650 ml  Net -1290 ml   Filed Weights   08/05/19 1143 08/06/19 0055  Weight: 90 kg 82.5 kg    ROS: Review of Systems  Constitutional: Negative for chills and fever.  Eyes: Negative for blurred vision.  Respiratory: Negative for cough and shortness of breath.   Cardiovascular: Negative for chest pain.  Gastrointestinal: Negative for abdominal pain, constipation, diarrhea, nausea and vomiting.  Genitourinary: Negative for dysuria.  Musculoskeletal: Negative for joint pain.  Neurological: Negative for dizziness and headaches.   Exam: Physical Exam  Constitutional: She is oriented to person, place, and time.  HENT:  Nose: No mucosal edema.  Mouth/Throat: No oropharyngeal exudate or posterior oropharyngeal edema.  Eyes: Pupils are equal, round, and reactive to light. Conjunctivae, EOM and lids are normal.  Swelling right eyelids  Neck: No JVD present.  Carotid bruit is not present. No edema present. No thyroid mass and no thyromegaly present.  Cardiovascular: S1 normal and S2 normal. Exam reveals no gallop.  No murmur heard. Pulses:      Dorsalis pedis pulses are 2+ on the right side and 2+ on the left side.  Respiratory: No respiratory distress. She has no wheezes. She has no rhonchi. She has no rales.  GI: Soft. Bowel sounds are normal. There is no abdominal tenderness.  Musculoskeletal:     Right ankle: She exhibits swelling.     Left ankle: She exhibits swelling.  Lymphadenopathy:    She has no cervical adenopathy.  Neurological: She is alert and oriented to person, place, and time. No cranial nerve deficit.  Skin: Skin is warm. Nails show no clubbing.  Swelling and bruising right side of the face.  Psychiatric: She has a normal mood and affect.      Data Reviewed: Basic Metabolic Panel: Recent Labs  Lab 08/05/19 1159 08/06/19 0629  NA 141 141  K 3.8 3.7  CL 104 107  CO2 28 26  GLUCOSE 105* 94  BUN 23 16  CREATININE 0.92 0.70  CALCIUM 9.8 9.6   LCBC: Recent Labs  Lab 08/05/19 1159 08/06/19 0629  WBC 5.2 6.2  HGB 12.8 12.9  HCT 39.8 37.4  MCV 88.2 85.4  PLT 193 211    CBG: Recent Labs  Lab 08/06/19 1149 08/06/19 1655 08/06/19 2118 08/07/19 0730 08/07/19 1151  GLUCAP 108* 103* 127*  104* 151*    Recent Results (from the past 240 hour(s))  SARS CORONAVIRUS 2 (TAT 6-24 HRS) Nasopharyngeal Nasopharyngeal Swab     Status: None   Collection Time: 08/05/19  1:30 PM   Specimen: Nasopharyngeal Swab  Result Value Ref Range Status   SARS Coronavirus 2 NEGATIVE NEGATIVE Final    Comment: (NOTE) SARS-CoV-2 target nucleic acids are NOT DETECTED. The SARS-CoV-2 RNA is generally detectable in upper and lower respiratory specimens during the acute phase of infection. Negative results do not preclude SARS-CoV-2 infection, do not rule out co-infections with other pathogens, and should not be used as the sole  basis for treatment or other patient management decisions. Negative results must be combined with clinical observations, patient history, and epidemiological information. The expected result is Negative. Fact Sheet for Patients: SugarRoll.be Fact Sheet for Healthcare Providers: https://www.woods-mathews.com/ This test is not yet approved or cleared by the [place] FDA and  has been authorized for detection and/or diagnosis of SARS-CoV-2 by FDA under an Emergency Use Authorization (EUA). This EUA will remain  in effect (meaning this test can be used) for the duration of the COVID-19 declaration under Section 56 4(b)(1) of the Act, 21 U.S.C. section 360bbb-3(b)(1), unless the authorization is terminated or revoked sooner. Performed at [facility], [place] [address] [place], [place] [address]      Studies: Ct Head Wo Contrast  Result Date: 08/05/2019 CLINICAL DATA:  Follow-up subarachnoid hemorrhage EXAM: CT HEAD WITHOUT CONTRAST TECHNIQUE: Contiguous axial images were obtained from the base of the skull through the vertex without intravenous contrast. COMPARISON:  Films from earlier in the same day. FINDINGS: Brain: Chronic atrophic and ischemic changes are again seen and stable. Persistent infarcts are noted in the basal ganglia bilaterally stable from the recent exam. Stable areas of hemorrhage are noted along the fornix on the right in the midline as well as within a left frontal sulcus. The overall appearance is stable from the prior exam. No new hemorrhage is seen. Vascular: No hyperdense vessel or unexpected calcification. Skull: Normal. Negative for fracture or focal lesion. Sinuses/Orbits: No acute finding. Other: None. IMPRESSION: Stable areas of hemorrhage in a left frontal sulcus as well as just to the right of the midline along the septum pellucidum. No significant increase in hemorrhage is seen. No new hemorrhage is noted. Chronic  atrophic and ischemic changes similar to that noted on the prior exam. Electronically Signed   By: Inez Catalina M.D.   On: 08/05/2019 19:15   US Carotid Bilateral  Result Date: 08/06/2019 CLINICAL DATA:  Syncope, hypertension, hyperlipidemia, former smoker EXAM: BILATERAL CAROTID DUPLEX ULTRASOUND TECHNIQUE: Pearline Cables scale imaging, color Doppler and duplex ultrasound were performed of bilateral carotid and vertebral arteries in the neck. COMPARISON:  06/09/2017 FINDINGS: Criteria: Quantification of carotid stenosis is based on velocity parameters that correlate the residual internal carotid diameter with NASCET-based stenosis levels, using the diameter of the distal internal carotid lumen as the denominator for stenosis measurement. The following velocity measurements were obtained: RIGHT ICA: 71/30 cm/sec CCA: 24/23 cm/sec SYSTOLIC ICA/CCA RATIO:  1.0 ECA: 100 cm/sec LEFT ICA: 75/7 cm/sec CCA: 53/6 cm/sec SYSTOLIC ICA/CCA RATIO:  2.0 ECA: 68 cm/sec RIGHT CAROTID ARTERY: Minor echogenic shadowing plaque formation. No hemodynamically significant right ICA stenosis, velocity elevation, or turbulent flow. Degree of narrowing less than 50%. RIGHT VERTEBRAL ARTERY:  Antegrade LEFT CAROTID ARTERY: Similar scattered minor echogenic plaque formation. No hemodynamically significant left ICA stenosis, velocity elevation, or turbulent flow. LEFT VERTEBRAL  ARTERY:  Antegrade IMPRESSION: Stable mild carotid atherosclerosis. No hemodynamically significant ICA stenosis. Degree of narrowing remains less than 50% bilaterally by ultrasound criteria. Patent antegrade vertebral flow bilaterally Electronically Signed   By: Judie Petit.  Shick M.D.   On: 08/06/2019 13:20    Scheduled Meds: . atorvastatin  40 mg Oral [id number]  . colchicine  0.6 mg Oral Daily  . hydrochlorothiazide  25 mg Oral Daily  . insulin aspart  0-5 Units Subcutaneous QHS  . insulin aspart  0-9 Units Subcutaneous TID WC  . levETIRAcetam  500 mg Oral BID  . oxybutynin   5 mg Oral BID  . potassium chloride SA  20 mEq Oral Daily  . sodium chloride flush  3 mL Intravenous Q12H   Continuous Infusions:  Assessment/Plan:  1. Traumatic subarachnoid hemorrhage with brief loss of consciousness.  Holding aspirin and Plavix.  Repeat CT scan did not show any worsening of the subarachnoid hemorrhage.  Patient seen by neurosurgery and neurology.  Patient does take Keppra at home.  Patient kept on same dose. 2. Syncope, bradycardia.  Hold Cardizem.  Patient not orthostatic.  Unfortunately orthostatic vital signs were not checked yesterday.  Patient still bradycardic.  Echocardiogram, carotid ultrasound and EEG did not show thing that could have caused her syncope. 3. Delirium with hallucination.  The patient slept well last night.  Would like to get the patient discharged from the hospital soon as possible.  Discontinue oxybutynin.  Would rather hold off on medications at this point since she slept last night. 4. Hypertension on hydrochlorothiazide.  Hold diltiazem secondary to bradycardia 5. Previous history of stroke.  Holding aspirin and Plavix. Stroke risk higher being off aspirin and Plavix. 6. Hemoglobin A1c 5.6.  Discontinue Metformin. 7. Hyperlipidemia unspecified on atorvastatin 8. Seizure history on Keppra as outpatient  Code Status:     Code Status Orders  (From admission, onward)         Start     Ordered   08/05/19 1519  Full code  Continuous     08/05/19 1519        Code Status History    Date Active Date Inactive Code Status Order ID Comments User Context   06/09/2017 1649 06/10/2017 1915 Full Code [id number]  Altamese Dilling, MD Inpatient   06/03/2017 1611 06/04/2017 1749 Full Code [id number]  Enid Baas, MD Inpatient   12/21/2015 2348 12/26/2015 2028 Full Code [id number]  Houston Siren, MD Inpatient   Advance Care Planning Activity     Family Communication: Case discussed with Dallie Dad on the phone this morning and again late  morning. Disposition Plan: Initially the patient's sister wanted her to go to rehab but then on the repeat phone call they are interested in taking her home.  Hopefully we will get her home tomorrow.  Consultants:  Neurology  Neurosurgery  Time spent: 28 minutes  [employer]

## 2019-08-07 NOTE — Progress Notes (Signed)
Pt has alternating time of being very alert and alert/slow to respond. Has delayed processing to request/retrieving information/ speaking. Denies pain/co's. Ambulated in room with walker with SB+ and tolerated well. Family updated by phone by provider and RN with tentative plans to be discharged home tomorrow.

## 2019-08-07 NOTE — TOC Progression Note (Signed)
Transition of Care [facility]) - Progression Note    Patient Details  Name: [name] MRN: [id number] Date of Birth: [birth year]  Transition [facility]) CM/SW Contact  Katrina Stack, RN Phone Number: 08/07/2019, 5:15 PM  Clinical Narrative:   Asked that nursing be added to home health    Expected Discharge Plan: [place] Barriers to Discharge: Continued Medical Work up  Expected Discharge Plan and Services Expected Discharge Plan: [place]   Discharge Planning Services: CM Consult Post Acute Care Choice: [place] arrangements for the past 2 months: Single Family Home                 DME Arranged: N/A         HH Arranged: PT, OT [place] Agency: [place] ([place]) Date HH Agency Contacted: 08/06/19 Time [place]: 1036 Representative spoke with at [employer]: [place] ([place]) Interventions    Readmission Risk Interventions No flowsheet data found.

## 2019-08-08 LAB — GLUCOSE, CAPILLARY
Glucose-Capillary: 110 mg/dL — ABNORMAL HIGH (ref 70–99)
Glucose-Capillary: 140 mg/dL — ABNORMAL HIGH (ref 70–99)

## 2019-08-08 MED ORDER — COLCHICINE 0.6 MG PO TABS: 0.6000 mg | ORAL_TABLET | Freq: Two times a day (BID) | ORAL | 0 refills | Status: AC | PRN

## 2019-08-08 NOTE — TOC Transition Note (Signed)
Transition of Care [facility]) - CM/SW Discharge Note   Patient Details  Name: Emily [name] MRN: [id number] Date of Birth: 07/19/1946  [facility] CM/SW Contact:  Elliot Gurney [place], [place] Phone Number: 08/08/2019, 11:05 AM   Clinical Narrative:     Patient to discharge home with [facility] PT, OT and nursing previously arranged through [place] ([facility]). Corene Cornea from [place] contacted to inform him of patient's discharge today.    Grayling Schranz, LCSW Clinical Social Worker  [phone]     Barriers to Discharge: Continued Medical Work up   Patient Goals and CMS Choice Patient states their goals for this hospitalization and ongoing recovery are:: get better CMS Medicare.gov Compare Post Acute Care list provided to:: Patient Choice offered to / list presented to : Patient  Discharge Placement                       Discharge Plan and Services   Discharge Planning Services: CM Consult Post Acute Care Choice: Home Health          DME Arranged: N/A         HH Arranged: PT, OT [place] Agency: [place] ([facility]) Date HH Agency Contacted: 08/06/19 Time [place]: 1036 Representative spoke with at [place]: [place] ([place]) Interventions     Readmission Risk Interventions No flowsheet data found.

## 2019-08-08 NOTE — Progress Notes (Signed)
Pt reports double vision in right eye with drooping of right eyelid noted. Has difficulty identified same 2 pictures in NIH scale( hammock and cacti). Sleeps frequently with dull affect. Slow to process some information- corrects self at times. VSS.  Ambulates with steady gait with walker to BR. Oral and written AVS instructions given to pt and sister with verbalized understanding. Pt transported to private vehiclein transport chair and released to her sister's care. Discharge home with HHS.

## 2019-08-08 NOTE — Discharge Summary (Signed)
[facility] [place] [facility]   PATIENT NAME: Emily [name]    MR#:  [id number]  DATE OF BIRTH:  10/02/1945  DATE OF ADMISSION:  08/05/2019 ADMITTING PHYSICIAN: Alford Highland, MD  DATE OF DISCHARGE: 08/08/2019  PRIMARY CARE PHYSICIAN: Marguarite Arbour, MD    ADMISSION DIAGNOSIS:  Syncope [R55] Platelet inhibition due to Plavix [Z79.02] Traumatic subarachnoid hemorrhage with loss of consciousness of 30 minutes or less, initial encounter (HCC) [S06.6X1A] Syncope, unspecified syncope type [R55]  DISCHARGE DIAGNOSIS:  Active Problems:   Type 2 diabetes mellitus with hyperlipidemia (HCC)   Syncope   Traumatic subarachnoid hemorrhage with loss of consciousness of 30 minutes or less (HCC)   Seizure (HCC)   Acute delirium   Bradycardia   SECONDARY DIAGNOSIS:   Past Medical History:  Diagnosis Date  . Essential hypertension, benign   . Gout, unspecified   . Mixed hyperlipidemia   . Stroke [facility])    march 2017  . Syncope and collapse   . Type II or unspecified type diabetes mellitus without mention of complication, uncontrolled     HOSPITAL COURSE:   1.  Traumatic subarachnoid hemorrhage with brief loss of consciousness.  Holding aspirin and Plavix.  The patient had 2 CT scans and the repeat CAT scan did not show any worsening of the subarachnoid hemorrhage.  Patient was seen in consultation by Dr. Marcell Barlow neurosurgery and neurology Dr. Loretha Brasil.  The patient already takes Keppra at home and has this medication.  She was kept on the same dose of Keppra.  Neurology said it is okay to go back on the aspirin in a few days since this was a traumatic event. 2.  Syncope, bradycardia.  The patient was not orthostatic during the hospitalization but was bradycardic.  Her Cardizem was held.  Echocardiogram, carotid ultrasound and EEG did not show anything that would have caused her syncope. 3.  Acute delirium with hallucination.  The patient did not  sleep well last night.  In speaking with the patient's sister, the best thing we can do is get her home as soon as possible.  This seems to have cleared on the day of discharge. 4.  Hypertension on Lasix.  Hold diltiazem secondary to bradycardia. 5.  Previous stroke.  Aspirin and Plavix currently on hold.  Follow-up with PMD.  Aspirin can be restarted in a few days. 6.  Hemoglobin A1c is 5.6.  I classify this as not being a diabetic.  Discontinue Metformin. 7.  Hyperlipidemia on atorvastatin 8.  Seizure history on Keppra as outpatient 9.  Weakness.  Physical therapy recommended home with home health.  I set up PT, OT, RN and aide. 10.  The patient told the nurse that she has some blurred vision on the right eye.  Will get to ophthalmology as outpatient.  DISCHARGE CONDITIONS:   Satisfactory  CONSULTS OBTAINED:  Treatment Team:  Pauletta Browns, MD Neurosurgery Dr. Marcell Barlow DRUG ALLERGIES:   Allergies  Allergen Reactions  . Enalapril Other (See Comments) and Shortness Of Breath    Unknown chest tightness  . Prednisone Palpitations    DISCHARGE MEDICATIONS:   Allergies as of 08/08/2019      Reactions   Enalapril Other (See Comments), Shortness Of Breath   Unknown chest tightness   Prednisone Palpitations      Medication List    STOP taking these medications   aspirin EC 81 MG tablet   clopidogrel 75 MG tablet Commonly known as: PLAVIX   diltiazem 90  MG tablet Commonly known as: CARDIZEM   hydrochlorothiazide 25 MG tablet Commonly known as: HYDRODIURIL   losartan 100 MG tablet Commonly known as: COZAAR   metFORMIN 1000 MG tablet Commonly known as: GLUCOPHAGE     TAKE these medications   acetaminophen 325 MG tablet Commonly known as: TYLENOL Take 650 mg by mouth every 6 (six) hours as needed.   allopurinol 100 MG tablet Commonly known as: ZYLOPRIM Take 200 mg by mouth daily. Notes to patient: Monday morning   atorvastatin 40 MG tablet Commonly known  as: LIPITOR Take 1 tablet (40 mg total) by mouth daily at 6 PM. Notes to patient: Next dose due Monday morning.   colchicine 0.6 MG tablet Take 1 tablet (0.6 mg total) by mouth 2 (two) times daily as needed. What changed:   when to take this  reasons to take this   furosemide 40 MG tablet Commonly known as: LASIX Take 40 mg by mouth daily. Notes to patient: Next dose due Monday morning   levETIRAcetam 500 MG tablet Commonly known as: KEPPRA Take 500 mg by mouth 2 (two) times daily. Notes to patient: Next dose due at bedtime tonight   oxybutynin 5 MG tablet Commonly known as: DITROPAN Take 5 mg by mouth 2 (two) times daily. Notes to patient: Next dose due tonight   polyethylene glycol 17 g packet Commonly known as: MIRALAX / GLYCOLAX Take 17 g by mouth daily. What changed:   when to take this  reasons to take this Notes to patient: Can start today if needed, if not take Monday morning.   potassium chloride SA 20 MEQ tablet Commonly known as: KLOR-CON Take 1 tablet (20 mEq total) by mouth daily. Notes to patient: Next dose due Monday morning.         DISCHARGE INSTRUCTIONS:   Follow-up PMD 5 days Follow-up Dr. Marcell Barlow neurosurgery 2 weeks  If you experience worsening of your admission symptoms, develop shortness of breath, life threatening emergency, suicidal or homicidal thoughts you must seek medical attention immediately by calling 911 or calling your MD immediately  if symptoms less severe.  You Must read complete instructions/literature along with all the possible adverse reactions/side effects for all the Medicines you take and that have been prescribed to you. Take any new Medicines after you have completely understood and accept all the possible adverse reactions/side effects.   Please note  You were cared for by a hospitalist during your hospital stay. If you have any questions about your discharge medications or the care you received while you were  in the hospital after you are discharged, you can call the unit and asked to speak with the hospitalist on call if the hospitalist that took care of you is not available. Once you are discharged, your primary care physician will handle any further medical issues. Please note that NO REFILLS for any discharge medications will be authorized once you are discharged, as it is imperative that you return to your primary care physician (or establish a relationship with a primary care physician if you do not have one) for your aftercare needs so that they can reassess your need for medications and monitor your lab values.    Today   CHIEF COMPLAINT:   Chief Complaint  Patient presents with  . Loss of Consciousness    HISTORY OF PRESENT ILLNESS:  Emily [name]  is a 73 y.o. female presented with a fall and loss of consciousness and found to have a traumatic subarachnoid hemorrhage.  VITAL SIGNS:  Blood pressure 126/64, pulse (!) 51, temperature 97.6 F (36.4 C), temperature source Oral, resp. rate 20, height 5\' 3"  (1.6 m), weight 82.5 kg, SpO2 99 %.  I/O:    Intake/Output Summary (Last 24 hours) at 08/08/2019 1239 Last data filed at 08/08/2019 1020 Gross per 24 hour  Intake 600 ml  Output 1000 ml  Net -400 ml    PHYSICAL EXAMINATION:  GENERAL:  73 y.o.-year-old patient lying in the bed with no acute distress.  EYES: Pupils equal, round, reactive to light and accommodation. No scleral icterus. Extraocular muscles intact.  HEENT: Head atraumatic, normocephalic. Oropharynx and nasopharynx clear.  NECK:  Supple, no jugular venous distention. No thyroid enlargement, no tenderness.  LUNGS: Decreased breath sounds bilaterally, no wheezing, rales,rhonchi or crepitation. No use of accessory muscles of respiration.  CARDIOVASCULAR: S1, S2 normal. No murmurs, rubs, or gallops.  ABDOMEN: Soft, non-tender, non-distended. Bowel sounds present. No organomegaly or mass.  EXTREMITIES: Trace pedal  edema, no cyanosis, or clubbing.  NEUROLOGIC: Cranial nerves II through XII are intact. Muscle strength 5/5 in all extremities. Sensation intact. Gait not checked.  PSYCHIATRIC: The patient is alert and seems to be answering questions appropriately.  SKIN: Bruising and swelling around the right cheek and under the right eye.  DATA REVIEW:   CBC Recent Labs  Lab 08/06/19 0629  WBC 6.2  HGB 12.9  HCT 37.4  PLT 211    Chemistries  Recent Labs  Lab 08/06/19 0629  NA 141  K 3.7  CL 107  CO2 26  GLUCOSE 94  BUN 16  CREATININE 0.70  CALCIUM 9.6     Microbiology Results  Results for orders placed or performed during the hospital encounter of 08/05/19  SARS CORONAVIRUS 2 (TAT 6-24 HRS) Nasopharyngeal Nasopharyngeal Swab     Status: None   Collection Time: 08/05/19  1:30 PM   Specimen: Nasopharyngeal Swab  Result Value Ref Range Status   SARS Coronavirus 2 NEGATIVE NEGATIVE Final    Comment: (NOTE) SARS-CoV-2 target nucleic acids are NOT DETECTED. The SARS-CoV-2 RNA is generally detectable in upper and lower respiratory specimens during the acute phase of infection. Negative results do not preclude SARS-CoV-2 infection, do not rule out co-infections with other pathogens, and should not be used as the sole basis for treatment or other patient management decisions. Negative results must be combined with clinical observations, patient history, and epidemiological information. The expected result is Negative. Fact Sheet for Patients: SugarRoll.be Fact Sheet for Healthcare Providers: https://www.woods-mathews.com/ This test is not yet approved or cleared by the [place] FDA and  has been authorized for detection and/or diagnosis of SARS-CoV-2 by FDA under an Emergency Use Authorization (EUA). This EUA will remain  in effect (meaning this test can be used) for the duration of the COVID-19 declaration under Section 56 4(b)(1) of  the Act, 21 U.S.C. section 360bbb-3(b)(1), unless the authorization is terminated or revoked sooner. Performed at [facility], [place] [address] [place], [place] [address]     RADIOLOGY:  US Carotid Bilateral  Result Date: 08/06/2019 CLINICAL DATA:  Syncope, hypertension, hyperlipidemia, former smoker EXAM: BILATERAL CAROTID DUPLEX ULTRASOUND TECHNIQUE: Pearline Cables scale imaging, color Doppler and duplex ultrasound were performed of bilateral carotid and vertebral arteries in the neck. COMPARISON:  06/09/2017 FINDINGS: Criteria: Quantification of carotid stenosis is based on velocity parameters that correlate the residual internal carotid diameter with NASCET-based stenosis levels, using the diameter of the distal internal carotid lumen as the denominator for  stenosis measurement. The following velocity measurements were obtained: RIGHT ICA: 71/30 cm/sec CCA: 73/14 cm/sec SYSTOLIC ICA/CCA RATIO:  1.0 ECA: 100 cm/sec LEFT ICA: 75/7 cm/sec CCA: 37/5 cm/sec SYSTOLIC ICA/CCA RATIO:  2.0 ECA: 68 cm/sec RIGHT CAROTID ARTERY: Minor echogenic shadowing plaque formation. No hemodynamically significant right ICA stenosis, velocity elevation, or turbulent flow. Degree of narrowing less than 50%. RIGHT VERTEBRAL ARTERY:  Antegrade LEFT CAROTID ARTERY: Similar scattered minor echogenic plaque formation. No hemodynamically significant left ICA stenosis, velocity elevation, or turbulent flow. LEFT VERTEBRAL ARTERY:  Antegrade IMPRESSION: Stable mild carotid atherosclerosis. No hemodynamically significant ICA stenosis. Degree of narrowing remains less than 50% bilaterally by ultrasound criteria. Patent antegrade vertebral flow bilaterally Electronically Signed   By: Judie PetitM.  Shick M.D.   On: 08/06/2019 13:20      Management plans discussed with the patient, family and they are in agreement.  CODE STATUS:     Code Status Orders  (From admission, onward)         Start     Ordered   08/05/19 1519  Full code   Continuous     08/05/19 1519        Code Status History    Date Active Date Inactive Code Status Order ID Comments User Context   06/09/2017 1649 06/10/2017 1915 Full Code [id number]  Altamese DillingVachhani, Vaibhavkumar, MD Inpatient   06/03/2017 1611 06/04/2017 1749 Full Code [id number]  Enid BaasKalisetti, Radhika, MD Inpatient   12/21/2015 2348 12/26/2015 2028 Full Code [id number]  Houston SirenSainani, Vivek J, MD Inpatient   Advance Care Planning Activity      TOTAL TIME TAKING CARE OF THIS PATIENT: 35 minutes.    Alford Highlandichard Azrael Huss M.D on 08/08/2019 at 12:39 PM  Between 7am to 6pm - Pager - [phone]  After 6pm go to [email or web address] - password [email or web address]  [facility]  CC: Primary care physician; Marguarite ArbourSparks, Jeffrey D, MD

## 2019-08-08 NOTE — Discharge Instructions (Signed)
Head Injury, Adult There are many types of head injuries. Head injuries can be as minor as a bump, or they can be a serious medical issue. More severe head injuries include:  A jarring injury to the brain (concussion).  A bruise (contusion) of the brain. This means there is bleeding in the brain that can cause swelling.  A cracked skull (skull fracture).  Bleeding in the brain that collects, clots, and forms a bump (hematoma). After a head injury, most problems occur within the first 24 hours, but side effects may occur up to 7-10 days after the injury. It is important to watch your condition for any changes. You may need to be observed in the emergency department or urgent care, or you may be admitted to the hospital. What are the causes? There are many possible causes of a head injury. A serious head injury may be caused by a car accident, bicycle or motorcycle accidents, sports injuries, and falls. What are the symptoms? Symptoms of a head injury include a contusion, bump, or bleeding at the site of the injury. Other physical symptoms may include:  Headache.  Nausea or vomiting.  Dizziness.  Feeling tired.  Being uncomfortable around bright lights or loud noises.  Seizures.  Trouble being awakened.  Fainting. Mental or emotional symptoms may include:  Irritability.  Confusion and memory problems.  Poor attention and concentration.  Changes in eating or sleeping habits.  Anxiety or depression. How is this diagnosed? This condition can usually be diagnosed based on your symptoms, a description of the injury, and a physical exam. You may also have imaging tests done, such as a CT scan or MRI. How is this treated? Treatment for this condition depends on the severity and type of injury you have. The main goal of treatment is to prevent complications and to allow the brain time to heal. Mild head injury If you have a mild head injury, you may be sent home and treatment  may include:  Observation. A responsible adult should stay with you for 24 hours after your injury and check on you often.  Physical rest.  Brain rest.  Pain medicines. Severe head injury If you have a severe head injury, treatment may include:  Close observation. This includes hospitalization with frequent physical exams.  Medicines to relieve pain, prevent seizures, and decrease brain swelling.  Breathing support. This may include using a ventilator.  Treatments to manage the swelling inside the brain.  Brain surgery. This may be needed to: ? Remove a blood clot. ? Stop the bleeding. ? Remove a part of the skull to allow room for the brain to swell. Follow these instructions at home: Activity  Rest and avoid activities that are physically hard or tiring.  Make sure you get enough sleep.  Limit activities that require a lot of thought or attention, such as: ? Watching TV. ? Playing memory games and puzzles. ? Job-related work or homework. ? Working on [employer], and texting.  Avoid activities that could cause another head injury, such as playing sports, until your health care provider approves. Having another head injury, especially before the first one has healed, can be dangerous.  Ask your health care provider when it is safe for you to return to your regular activities, including work or school. Ask your health care provider for a step-by-step plan for gradually returning to activities.  Ask your health care provider when you can drive, ride a bicycle, or use heavy  machinery. Your ability to react may be slower after a brain injury. Do not do these activities if you are dizzy. Lifestyle   Do not drink alcohol until your health care provider approves. Do not use drugs. Alcohol and certain drugs may slow your recovery and can put you at risk of further injury.  If it is harder than usual to remember things, write them down.  If you are easily  distracted, try to do one thing at a time.  Talk with family members or close friends when making important decisions.  Tell your friends, family, a trusted colleague, and work Production designer, theatre/television/filmmanager about your injury, symptoms, and restrictions. Have them watch for any new or worsening problems. General instructions  Take over-the-counter and prescription medicines only as told by your health care provider.  Have someone stay with you for 24 hours after your head injury. This person should watch you for any changes in your symptoms and be ready to seek medical help.  Keep all follow-up visits as told by your health care provider. This is important. How is this prevented?  Work on improving your balance and strength to avoid falls.  Wear a seatbelt when you are in a moving vehicle.  Wear a helmet when riding a bicycle, skiing, or doing any other sport or activity that has a risk of injury.  If you drink alcohol: ? Limit how much you use to: ? 0-1 drink a day for women. ? 0-2 drinks a day for men. ? Be aware of how much alcohol is in your drink. In the [place] one drink equals one 12 oz bottle of beer (355 mL), one 5 oz glass of wine (148 mL), or one 1 oz glass of hard liquor (44 mL).  Take safety measures in your home, such as: ? Removing clutter and tripping hazards from floors and stairways. ? Using grab bars in bathrooms and handrails by stairs. ? Placing non-slip mats on floors and in bathtubs. ? Improving lighting in dim areas. Get help right away if:  You have: ? A severe headache that is not helped by medicine. ? Trouble walking or weakness in your arms and legs. ? Clear or bloody fluid coming from your nose or ears. ? Changes in your vision. ? A seizure.  You lose your balance.  You vomit.  Your pupils change size.  Your speech is slurred.  Your dizziness gets worse.  You faint.  You are sleepier than normal and have trouble staying awake.  Your symptoms get  worse. These symptoms may represent a serious problem that is an emergency. Do not wait to see if the symptoms will go away. Get medical help right away. Call your local emergency services (911 in the [place] Do not drive yourself to the hospital. Summary  Head injuries can be minor or they can be a serious medical issue requiring immediate attention.  Treatment for this condition depends on the severity and type of injury you have.  Ask your health care provider when it is safe for you to return to your regular activities, including work or school.  Head injury prevention includes wearing a seat belt in a motor vehicle, using a helmet on a bicycle, limiting alcohol use, and taking safety measures in your home. This information is not intended to replace advice given to you by your health care provider. Make sure you discuss any questions you have with your health care provider. Document Released: 09/09/2005 Document Revised: 10/07/2018 Document Reviewed: 10/02/2018 Elsevier  Patient Education  [employer]. As per neurology okay to restart aspirin in a few days

## 2019-08-18 ENCOUNTER — Other Ambulatory Visit: Payer: Self-pay | Admitting: Internal Medicine

## 2019-08-18 DIAGNOSIS — I609 Nontraumatic subarachnoid hemorrhage, unspecified: Secondary | ICD-10-CM

## 2019-09-03 ENCOUNTER — Other Ambulatory Visit: Payer: Self-pay

## 2019-09-03 ENCOUNTER — Ambulatory Visit
Admission: RE | Admit: 2019-09-03 | Discharge: 2019-09-03 | Disposition: A | Discharge status: Home / Self Care | Payer: Medicare Other | Source: Ambulatory Visit | Attending: Internal Medicine | Admitting: Internal Medicine

## 2019-09-03 DIAGNOSIS — I609 Nontraumatic subarachnoid hemorrhage, unspecified: Secondary | ICD-10-CM | POA: Insufficient documentation

## 2019-09-11 ENCOUNTER — Emergency Department
Admission: EM | Admit: 2019-09-11 | Discharge: 2019-09-11 | Disposition: A | Discharge status: Home / Self Care | Payer: Medicare Other | Attending: Emergency Medicine | Admitting: Emergency Medicine

## 2019-09-11 ENCOUNTER — Other Ambulatory Visit: Payer: Self-pay

## 2019-09-11 ENCOUNTER — Emergency Department: Payer: Medicare Other

## 2019-09-11 DIAGNOSIS — I10 Essential (primary) hypertension: Secondary | ICD-10-CM | POA: Diagnosis not present

## 2019-09-11 DIAGNOSIS — Z87891 Personal history of nicotine dependence: Secondary | ICD-10-CM | POA: Insufficient documentation

## 2019-09-11 DIAGNOSIS — Z79899 Other long term (current) drug therapy: Secondary | ICD-10-CM | POA: Insufficient documentation

## 2019-09-11 DIAGNOSIS — R404 Transient alteration of awareness: Secondary | ICD-10-CM | POA: Diagnosis not present

## 2019-09-11 DIAGNOSIS — R569 Unspecified convulsions: Secondary | ICD-10-CM | POA: Diagnosis present

## 2019-09-11 DIAGNOSIS — J45909 Unspecified asthma, uncomplicated: Secondary | ICD-10-CM | POA: Insufficient documentation

## 2019-09-11 DIAGNOSIS — E860 Dehydration: Secondary | ICD-10-CM

## 2019-09-11 LAB — CBC WITH DIFFERENTIAL/PLATELET
Abs Immature Granulocytes: 0.01 10*3/uL (ref 0.00–0.07)
Basophils Absolute: 0 10*3/uL (ref 0.0–0.1)
Basophils Relative: 1 %
Eosinophils Absolute: 0.3 10*3/uL (ref 0.0–0.5)
Eosinophils Relative: 6 %
HCT: 38.8 % (ref 36.0–46.0)
Hemoglobin: 12.9 g/dL (ref 12.0–15.0)
Immature Granulocytes: 0 %
Lymphocytes Relative: 31 %
Lymphs Abs: 1.6 10*3/uL (ref 0.7–4.0)
MCH: 28.3 pg (ref 26.0–34.0)
MCHC: 33.2 g/dL (ref 30.0–36.0)
MCV: 85.1 fL (ref 80.0–100.0)
Monocytes Absolute: 0.5 10*3/uL (ref 0.1–1.0)
Monocytes Relative: 9 %
Neutro Abs: 2.8 10*3/uL (ref 1.7–7.7)
Neutrophils Relative %: 53 %
Platelets: 199 10*3/uL (ref 150–400)
RBC: 4.56 MIL/uL (ref 3.87–5.11)
RDW: 12.3 % (ref 11.5–15.5)
WBC: 5.3 10*3/uL (ref 4.0–10.5)
nRBC: 0 % (ref 0.0–0.2)

## 2019-09-11 LAB — BASIC METABOLIC PANEL
Anion gap: 10 (ref 5–15)
BUN: 24 mg/dL — ABNORMAL HIGH (ref 8–23)
CO2: 28 mmol/L (ref 22–32)
Calcium: 9.6 mg/dL (ref 8.9–10.3)
Chloride: 103 mmol/L (ref 98–111)
Creatinine, Ser: 1.07 mg/dL — ABNORMAL HIGH (ref 0.44–1.00)
GFR calc Af Amer: >60 mL/min (ref >60)
GFR calc non Af Amer: 52 mL/min — ABNORMAL LOW (ref >60)
Glucose, Bld: 121 mg/dL — ABNORMAL HIGH (ref 70–99)
Potassium: 3.8 mmol/L (ref 3.5–5.1)
Sodium: 141 mmol/L (ref 135–145)

## 2019-09-11 MED ORDER — SODIUM CHLORIDE 0.9 % IV BOLUS
500.0000 mL | Freq: Once | INTRAVENOUS | Status: AC
  Administered 2019-09-11: 500 mL via INTRAVENOUS

## 2019-09-11 NOTE — ED Provider Notes (Signed)
[facility] Emergency Department Provider Note  ____________________________________________  Time seen: Approximately 9:43 PM  I have reviewed the triage vital signs and the nursing notes.   HISTORY  Chief Complaint Seizures    HPI Emily [name] is a 73 y.o. female with a history of gout, hypertension, stroke, seizure who is brought to the ED due to a staring spell  today.  The patient reports that she was in her usual state of health, feeling normal and fine today when she was sitting on her bed to wrap Christmas presents.  Apparently her grandson came to ask her a question but she was not responsive to him, so the grandson went to get the patient's son who came and saw the patient to still not be responsive and called EMS.  The patient notes that her previous seizures were characterized by staring episodes of unresponsiveness.  No convulsive activity today or falls or trauma.  No tongue biting or postictal phase or urinary incontinence.  Patient denies any headache fevers chills neck pain or stiffness.  She feels back to normal currently.     Past Medical History:  Diagnosis Date  . Essential hypertension, benign   . Gout, unspecified   . Mixed hyperlipidemia   . Stroke [facility])    march 2017  . Syncope and collapse   . Type II or unspecified type diabetes mellitus without mention of complication, uncontrolled      Patient Active Problem List   Diagnosis Date Noted  . Acute delirium   . Bradycardia   . Seizure (HCC)   . Traumatic subarachnoid hemorrhage with loss of consciousness of 30 minutes or less (HCC) 08/05/2019  . TIA (transient ischemic attack) 06/09/2017  . Gouty arthritis 06/03/2017  . Acute CVA (cerebrovascular accident) (HCC) 12/22/2015  . Syncope 12/21/2015  . Hyperlipidemia 06/23/2013  . Type 2 diabetes mellitus with hyperlipidemia (HCC) 06/23/2013  . HTN (hypertension) 06/23/2013  . Morbid obesity (HCC) 06/23/2013  .  Abnormal ECG 06/23/2013     Past Surgical History:  Procedure Laterality Date  . ABDOMINAL HYSTERECTOMY    . TEE WITHOUT CARDIOVERSION N/A 12/26/2015   Procedure: TRANSESOPHAGEAL ECHOCARDIOGRAM (TEE);  Surgeon: Dalia Heading, MD;  Location: [facility] ORS;  Service: Cardiovascular;  Laterality: N/A;  . TONSILLECTOMY AND ADENOIDECTOMY       Prior to Admission medications   Medication Sig Start Date End Date Taking? Authorizing Provider  acetaminophen (TYLENOL) 325 MG tablet Take 650 mg by mouth every 6 (six) hours as needed.    [provider]  allopurinol (ZYLOPRIM) 100 MG tablet Take 200 mg by mouth daily. 07/26/19   [provider]  atorvastatin (LIPITOR) 40 MG tablet Take 1 tablet (40 mg total) by mouth daily at 6 PM. Patient not taking: Reported on 08/05/2019 12/23/15   Shaune Pollack, MD  colchicine 0.6 MG tablet Take 1 tablet (0.6 mg total) by mouth 2 (two) times daily as needed. 08/08/19   Alford Highland, MD  furosemide (LASIX) 40 MG tablet Take 40 mg by mouth daily. 07/23/19   [provider]  levETIRAcetam (KEPPRA) 500 MG tablet Take 500 mg by mouth 2 (two) times daily. 07/26/19   [provider]  oxybutynin (DITROPAN) 5 MG tablet Take 5 mg by mouth 2 (two) times daily.    [provider]  polyethylene glycol (MIRALAX / GLYCOLAX) packet Take 17 g by mouth daily. Patient taking differently: Take 17 g by mouth daily as needed for moderate constipation.  12/26/15  Delfino LovettShah, Vipul, MD  potassium chloride SA (K-DUR,KLOR-CON) 20 MEQ tablet Take 1 tablet (20 mEq total) by mouth daily. 06/10/17   Milagros LollSudini, Srikar, MD     Allergies Enalapril and Prednisone   Family History  Problem Relation Age of Onset  . Heart disease Mother   . Heart attack Mother   . Hypertension Mother   . Heart disease Father   . Heart attack Father   . Hypertension Father   . Heart disease Brother   . Stroke Brother   . Heart attack Other     Social History Social History    Tobacco Use  . Smoking status: Former Games developermoker  . Smokeless tobacco: Never Used  Substance Use Topics  . Alcohol use: Yes  . Drug use: No    Review of Systems  Constitutional:   No fever or chills.  ENT:   No sore throat. No rhinorrhea. Cardiovascular:   No chest pain or syncope. Respiratory:   No dyspnea or cough. Gastrointestinal:   Negative for abdominal pain, vomiting and diarrhea.  Musculoskeletal:   Negative for focal pain or swelling All other systems reviewed and are negative except as documented above in ROS and HPI.  ____________________________________________   PHYSICAL EXAM:  VITAL SIGNS: ED Triage Vitals  Enc Vitals Group     BP 09/11/19 1900 (!) 124/58     Pulse Rate 09/11/19 1900 (!) 52     Resp 09/11/19 1900 13     Temp 09/11/19 1903 (!) 97.5 F (36.4 C)     Temp Source 09/11/19 1903 Oral     SpO2 09/11/19 1900 98 %     Weight 09/11/19 1904 180 lb (81.6 kg)     Height 09/11/19 1904 5\' 3"  (1.6 m)     Head Circumference --      Peak Flow --      Pain Score 09/11/19 1904 0     Pain Loc --      Pain Edu? --      Excl. in [place]? --     Vital signs reviewed, nursing assessments reviewed.   Constitutional:   Alert and oriented. Non-toxic appearance. Eyes:   Conjunctivae are normal. EOMI. PERRL. ENT      Head:   Normocephalic and atraumatic.      Nose:   Wearing a mask.      Mouth/Throat:   Wearing a mask.      Neck:   No meningismus. Full ROM. Hematological/Lymphatic/Immunilogical:   No cervical lymphadenopathy. Cardiovascular:   RRR. Symmetric bilateral radial and DP pulses.  No murmurs. Cap refill less than 2 seconds. Respiratory:   Normal respiratory effort without tachypnea/retractions. Breath sounds are clear and equal bilaterally. No wheezes/rales/rhonchi. Gastrointestinal:   Soft and nontender. Non distended. There is no CVA tenderness.  No rebound, rigidity, or guarding. Genitourinary:   deferred Musculoskeletal:   Normal range of motion in  all extremities. No joint effusions.  No lower extremity tenderness.  No edema. Neurologic:   Normal speech and language.  Motor grossly intact.  No drift Cerebellar function intact No acute focal neurologic deficits are appreciated.  Skin:    Skin is warm, dry and intact. No rash noted.  No petechiae, purpura, or bullae.  ____________________________________________    LABS (pertinent positives/negatives) (all labs ordered are listed, but only abnormal results are displayed) Labs Reviewed  BASIC METABOLIC PANEL - Abnormal; Notable for the following components:      Result Value   Glucose, Bld 121 (*)  BUN 24 (*)    Creatinine, Ser 1.07 (*)    GFR calc non Af Amer 52 (*)    All other components within normal limits  CBC WITH DIFFERENTIAL/PLATELET   ____________________________________________   EKG  Interpreted by me Sinus rhythm, bradycardia rate of 52.  Right axis, normal intervals.  Poor R wave progression.  Normal ST segments and T waves.  ____________________________________________    RADIOLOGY  CT Head Wo Contrast  Result Date: 09/11/2019 CLINICAL DATA:  Acute stroke. Confusion. Seizure-like activity. Recent subarachnoid hemorrhage. EXAM: CT HEAD WITHOUT CONTRAST TECHNIQUE: Contiguous axial images were obtained from the base of the skull through the vertex without intravenous contrast. COMPARISON:  09/03/2019 FINDINGS: Brain: No evidence of acute infarction, hemorrhage, hydrocephalus, extra-axial collection, or mass lesion/mass effect. Stable cerebral atrophy and chronic small vessel disease. Old large left basal ganglia lacunar infarcts are stable. Vascular:  No hyperdense vessel or other acute findings. Skull: No evidence of fracture or other significant bone abnormality. Sinuses/Orbits:  No acute findings. Other: None. IMPRESSION: No acute intracranial abnormality. Stable cerebral atrophy, chronic small vessel disease, and old basal ganglia lacunar infarcts.  Electronically Signed   By: Marlaine Hind M.D.   On: 09/11/2019 21:16    ____________________________________________   PROCEDURES Procedures  ____________________________________________  DIFFERENTIAL DIAGNOSIS   Intracranial hemorrhage, dehydration, electrolyte abnormality, absence seizure  CLINICAL IMPRESSION / ASSESSMENT AND PLAN / ED COURSE  Medications ordered in the ED: Medications  sodium chloride 0.9 % bolus 500 mL (500 mLs Intravenous New Bag/Given 09/11/19 2113)    Pertinent labs & imaging results that were available during my care of the patient were reviewed by me and considered in my medical decision making (see chart for details).  Emily [name] was evaluated in Emergency Department on 09/11/2019 for the symptoms described in the history of present illness. She was evaluated in the context of the global COVID-19 pandemic, which necessitated consideration that the patient might be at risk for infection with the SARS-CoV-2 virus that causes COVID-19. Institutional protocols and algorithms that pertain to the evaluation of patients at risk for COVID-19 are in a state of rapid change based on information released by regulatory bodies including the CDC and federal and state organizations. These policies and algorithms were followed during the patient's care in the ED.   Patient presents with a staring episode at home, possibly absence seizure which was self limited.  Vital signs unremarkable, patient denies any prodromal symptoms and currently feels at baseline and asymptomatic.  EKG unremarkable.  Will check labs and CT scan of the head.  Clinical Course as of Sep 10 2142  Sat Sep 11, 2019  2141 CT scan unremarkable.  Labs are overall reassuring although there is a slight increase in creatinine from baseline consistent with a degree of dehydration.  Vital signs are stable, patient is nontoxic in the ED and stable for discharge home and outpatient follow-up with primary  care/neurology.   [PS]    Clinical Course User Index [PS] Carrie Mew, MD     ----------------------------------------- 9:48 PM on 09/11/2019 -----------------------------------------  Tolerating oral intake.  ____________________________________________   FINAL CLINICAL IMPRESSION(S) / ED DIAGNOSES    Final diagnoses:  Staring episodes  Dehydration     ED Discharge Orders    None      Portions of this note were generated with dragon dictation software. Dictation errors may occur despite best attempts at proofreading.   Carrie Mew, MD 09/11/19 2148

## 2019-09-11 NOTE — Discharge Instructions (Signed)
Your lab test today show a degree of dehydration.  Increase your fluid intake to maintain good hydration.  Your CT scan of the head looks okay and does not show any acute issues.  Please follow-up with your doctor this week for continued monitoring of your symptoms.

## 2019-09-11 NOTE — ED Triage Notes (Signed)
Pt arrives [facility] from home for seizure like activity. Hx seizures. No seizures in a year. Subarachnoid hemorrhage 3 weeks ago per EMS. Was seen here last week with good head CT per EMS. Grandson walked into bedroom and pt was sitting there, absent stare, not responding. Went and grabbed pt son who told EMS this episode lasted about 4 minutes. EMS reports pt confusing L and R legs. No sensation loss. No numbness/tingling. No vision or speech changes. No arm drift noted. Denies pain.

## 2019-09-11 NOTE — ED Notes (Signed)
Assisted [facility] with applying external catheter on patient.AS

## 2019-09-11 NOTE — ED Notes (Signed)
500 ml bolus not infusing. IV flushed and pt instructed to hold arm straight for infusion.

## 2019-12-13 ENCOUNTER — Other Ambulatory Visit: Payer: Self-pay | Admitting: Internal Medicine

## 2019-12-13 DIAGNOSIS — Z1231 Encounter for screening mammogram for malignant neoplasm of breast: Secondary | ICD-10-CM

## 2020-01-08 ENCOUNTER — Emergency Department
Admission: EM | Admit: 2020-01-08 | Discharge: 2020-01-08 | Disposition: A | Discharge status: Home / Self Care | Payer: Medicare Other | Attending: Emergency Medicine | Admitting: Emergency Medicine

## 2020-01-08 ENCOUNTER — Other Ambulatory Visit: Payer: Self-pay

## 2020-01-08 DIAGNOSIS — R569 Unspecified convulsions: Secondary | ICD-10-CM | POA: Diagnosis not present

## 2020-01-08 DIAGNOSIS — Z79899 Other long term (current) drug therapy: Secondary | ICD-10-CM | POA: Insufficient documentation

## 2020-01-08 DIAGNOSIS — E119 Type 2 diabetes mellitus without complications: Secondary | ICD-10-CM | POA: Insufficient documentation

## 2020-01-08 DIAGNOSIS — I10 Essential (primary) hypertension: Secondary | ICD-10-CM | POA: Diagnosis not present

## 2020-01-08 DIAGNOSIS — Z87891 Personal history of nicotine dependence: Secondary | ICD-10-CM | POA: Insufficient documentation

## 2020-01-08 LAB — COMPREHENSIVE METABOLIC PANEL
ALT: 10 U/L (ref 0–44)
AST: 19 U/L (ref 15–41)
Albumin: 3.6 g/dL (ref 3.5–5.0)
Alkaline Phosphatase: 88 U/L (ref 38–126)
Anion gap: 8 (ref 5–15)
BUN: 23 mg/dL (ref 8–23)
CO2: 26 mmol/L (ref 22–32)
Calcium: 9.4 mg/dL (ref 8.9–10.3)
Chloride: 106 mmol/L (ref 98–111)
Creatinine, Ser: 1.16 mg/dL — ABNORMAL HIGH (ref 0.44–1.00)
GFR calc Af Amer: 55 mL/min — ABNORMAL LOW (ref >60)
GFR calc non Af Amer: 47 mL/min — ABNORMAL LOW (ref >60)
Glucose, Bld: 106 mg/dL — ABNORMAL HIGH (ref 70–99)
Potassium: 3.6 mmol/L (ref 3.5–5.1)
Sodium: 140 mmol/L (ref 135–145)
Total Bilirubin: 1 mg/dL (ref 0.3–1.2)
Total Protein: 7.1 g/dL (ref 6.5–8.1)

## 2020-01-08 LAB — CBC
HCT: 39.6 % (ref 36.0–46.0)
Hemoglobin: 13.1 g/dL (ref 12.0–15.0)
MCH: 28.4 pg (ref 26.0–34.0)
MCHC: 33.1 g/dL (ref 30.0–36.0)
MCV: 85.7 fL (ref 80.0–100.0)
Platelets: 186 10*3/uL (ref 150–400)
RBC: 4.62 MIL/uL (ref 3.87–5.11)
RDW: 13.3 % (ref 11.5–15.5)
WBC: 3.8 10*3/uL — ABNORMAL LOW (ref 4.0–10.5)
nRBC: 0 % (ref 0.0–0.2)

## 2020-01-08 LAB — GLUCOSE, CAPILLARY: Glucose-Capillary: 85 mg/dL (ref 70–99)

## 2020-01-08 MED ORDER — ACETAMINOPHEN 325 MG PO TABS
650.0000 mg | ORAL_TABLET | Freq: Once | ORAL | Status: AC
  Administered 2020-01-08: 650 mg via ORAL
  Filled 2020-01-08: qty 2

## 2020-01-08 NOTE — ED Provider Notes (Signed)
[facility] Emergency Department Provider Note   ____________________________________________    I have reviewed the triage vital signs and the nursing notes.   HISTORY  Chief Complaint Seizures     HPI Emily [name] is a 74 y.o. female with a history of stroke, seizure, diabetes brought in by EMS for reported seizure.  Apparently the son reported the patient had one of her "staring episodes "which is consistent with her prior seizure history.  She is back to her baseline now and overall feels well.  Denies tongue injury, no tonic-clonic symptoms, no loss of urinary continence.  Reports compliance with medication, is not sure if she had her medication this morning or not.  Does see neurology for seizures  Past Medical History:  Diagnosis Date  . Essential hypertension, benign   . Gout, unspecified   . Mixed hyperlipidemia   . Stroke [facility])    march 2017  . Syncope and collapse   . Type II or unspecified type diabetes mellitus without mention of complication, uncontrolled     Patient Active Problem List   Diagnosis Date Noted  . Acute delirium   . Bradycardia   . Seizure (HCC)   . Traumatic subarachnoid hemorrhage with loss of consciousness of 30 minutes or less (HCC) 08/05/2019  . TIA (transient ischemic attack) 06/09/2017  . Gouty arthritis 06/03/2017  . Acute CVA (cerebrovascular accident) (HCC) 12/22/2015  . Syncope 12/21/2015  . Hyperlipidemia 06/23/2013  . Type 2 diabetes mellitus with hyperlipidemia (HCC) 06/23/2013  . HTN (hypertension) 06/23/2013  . Morbid obesity (HCC) 06/23/2013  . Abnormal ECG 06/23/2013    Past Surgical History:  Procedure Laterality Date  . ABDOMINAL HYSTERECTOMY    . TEE WITHOUT CARDIOVERSION N/A 12/26/2015   Procedure: TRANSESOPHAGEAL ECHOCARDIOGRAM (TEE);  Surgeon: Dalia Heading, MD;  Location: [facility] ORS;  Service: Cardiovascular;  Laterality: N/A;  . TONSILLECTOMY AND ADENOIDECTOMY      Prior to  Admission medications   Medication Sig Start Date End Date Taking? Authorizing Provider  acetaminophen (TYLENOL) 325 MG tablet Take 650 mg by mouth every 6 (six) hours as needed.    [provider]  allopurinol (ZYLOPRIM) 100 MG tablet Take 200 mg by mouth daily. 07/26/19   [provider]  atorvastatin (LIPITOR) 40 MG tablet Take 1 tablet (40 mg total) by mouth daily at 6 PM. Patient not taking: Reported on 08/05/2019 12/23/15   Shaune Pollack, MD  colchicine 0.6 MG tablet Take 1 tablet (0.6 mg total) by mouth 2 (two) times daily as needed. 08/08/19   Alford Highland, MD  furosemide (LASIX) 40 MG tablet Take 40 mg by mouth daily. 07/23/19   [provider]  levETIRAcetam (KEPPRA) 500 MG tablet Take 500 mg by mouth 2 (two) times daily. 07/26/19   [provider]  oxybutynin (DITROPAN) 5 MG tablet Take 5 mg by mouth 2 (two) times daily.    [provider]  polyethylene glycol (MIRALAX / GLYCOLAX) packet Take 17 g by mouth daily. Patient taking differently: Take 17 g by mouth daily as needed for moderate constipation.  12/26/15   Delfino Lovett, MD  potassium chloride SA (K-DUR,KLOR-CON) 20 MEQ tablet Take 1 tablet (20 mEq total) by mouth daily. 06/10/17   Milagros Loll, MD     Allergies Enalapril and Prednisone  Family History  Problem Relation Age of Onset  . Heart disease Mother   . Heart attack Mother   . Hypertension Mother   . Heart disease  Father   . Heart attack Father   . Hypertension Father   . Heart disease Brother   . Stroke Brother   . Heart attack Other     Social History Social History   Tobacco Use  . Smoking status: Former Research scientist (life sciences)  . Smokeless tobacco: Never Used  Substance Use Topics  . Alcohol use: Yes  . Drug use: No    Review of Systems  Constitutional: No fever/chills Eyes: No visual changes.  ENT: No sore throat. Cardiovascular: Denies chest pain. Respiratory: Denies shortness of breath. Gastrointestinal: No  abdominal pain.  No nausea, no vomiting.   Genitourinary: Negative for dysuria. Musculoskeletal: Negative for back pain. Skin: Negative for rash. Neurological: Negative for headaches or weakness   ____________________________________________   PHYSICAL EXAM:  VITAL SIGNS: ED Triage Vitals  Enc Vitals Group     BP 01/08/20 1218 137/61     Pulse Rate 01/08/20 1218 (!) 49     Resp 01/08/20 1218 (!) 21     Temp 01/08/20 1220 98.3 F (36.8 C)     Temp Source 01/08/20 1220 Oral     SpO2 01/08/20 1218 97 %     Weight 01/08/20 1220 93.4 kg (206 lb)     Height 01/08/20 1220 1.6 m (5\' 3" )     Head Circumference --      Peak Flow --      Pain Score 01/08/20 1219 0     Pain Loc --      Pain Edu? --      Excl. in [place]? --     Constitutional: Alert and oriented. Eyes: Conjunctivae are normal.   Nose: No congestion/rhinnorhea. Mouth/Throat: Mucous membranes are moist.  No tongue laceration  Cardiovascular: Normal rate, regular rhythm. Good peripheral circulation. Respiratory: Normal respiratory effort.  No retractions. Gastrointestinal: Soft and nontender. No distention.    Musculoskeletal:  Warm and well perfused Neurologic:  Normal speech and language. No gross focal neurologic deficits are appreciated.  Cranial nerves II through XII are normal Skin:  Skin is warm, dry and intact. No rash noted. Psychiatric: Mood and affect are normal. Speech and behavior are normal.  ____________________________________________   LABS (all labs ordered are listed, but only abnormal results are displayed)  Labs Reviewed  CBC - Abnormal; Notable for the following components:      Result Value   WBC 3.8 (*)    All other components within normal limits  COMPREHENSIVE METABOLIC PANEL - Abnormal; Notable for the following components:   Glucose, Bld 106 (*)    Creatinine, Ser 1.16 (*)    GFR calc non Af Amer 47 (*)    GFR calc Af Amer 55 (*)    All other components within normal limits    GLUCOSE, CAPILLARY  CBG MONITORING, ED   ____________________________________________  EKG  ED ECG REPORT I, Lavonia Drafts, the attending physician, personally viewed and interpreted this ECG.  Date: 01/08/2020  Rhythm: Sinus bradycardia QRS Axis: normal Intervals: normal ST/T Wave abnormalities: normal Narrative Interpretation: no evidence of acute ischemia  ____________________________________________  RADIOLOGY  None ____________________________________________   PROCEDURES  Procedure(s) performed: No  Procedures   Critical Care performed: No ____________________________________________   INITIAL IMPRESSION / ASSESSMENT AND PLAN / ED COURSE  Pertinent labs & imaging results that were available during my care of the patient were reviewed by me and considered in my medical decision making (see chart for details).  Patient well-appearing in no acute distress.  She complains of a  mild headache which she reports is typical post seizure.  No loss of bowel or bladder.  No injury no fall.  Reports compliance with medication.  Will check labs, observe in the emergency department.  ----------------------------------------- 2:58 PM on 01/08/2020 -----------------------------------------  Observe x2 hours in the emergency department, no further seizure activity, lab work is unremarkable.  Recommend contacting neurologist to discuss whether medication adjustment is necessary.    ____________________________________________   FINAL CLINICAL IMPRESSION(S) / ED DIAGNOSES  Final diagnoses:  Seizure (HCC)        Note:  This document was prepared using Dragon voice recognition software and may include unintentional dictation errors.   Jene Every, MD 01/08/20 1459

## 2020-01-08 NOTE — ED Triage Notes (Signed)
Pt to ED via [facility] from home. Per EMS pt had a 7-70min seizure witnessed by son. Pt has hx of seizures starting after prior stroke. Pt has baseline right sided deficits from stroke. Pt last seizure in November. Pt currently on lamotrigine. Pt hasn't taken meds today.   Upon arrival pt A&Ox3. Pt in NAD. Pt c/o HA pain 8/10.

## 2020-01-18 ENCOUNTER — Other Ambulatory Visit: Payer: Self-pay

## 2020-01-18 ENCOUNTER — Ambulatory Visit (INDEPENDENT_AMBULATORY_CARE_PROVIDER_SITE_OTHER): Payer: Medicare Other | Admitting: Podiatry

## 2020-01-18 DIAGNOSIS — E1169 Type 2 diabetes mellitus with other specified complication: Secondary | ICD-10-CM

## 2020-01-18 DIAGNOSIS — B351 Tinea unguium: Secondary | ICD-10-CM | POA: Diagnosis not present

## 2020-01-18 DIAGNOSIS — M79675 Pain in left toe(s): Secondary | ICD-10-CM

## 2020-01-18 DIAGNOSIS — M79674 Pain in right toe(s): Secondary | ICD-10-CM | POA: Diagnosis not present

## 2020-01-18 DIAGNOSIS — E785 Hyperlipidemia, unspecified: Secondary | ICD-10-CM | POA: Diagnosis not present

## 2020-01-19 ENCOUNTER — Encounter: Payer: Self-pay | Admitting: Podiatry

## 2020-01-19 NOTE — Progress Notes (Signed)
  Subjective:  Patient ID: Emily [name], female    DOB: 20-May-1946,  MRN: [id number]  Chief Complaint  Patient presents with  . Nail Problem    pt is here for routine foot care, pt is also a diabetic typer 2   74 y.o. female returns for the above complaint.  Patient presents with thickened elongated dystrophic toenails x10.  They are painful in nature.  She would like to have them debrided down.  Patient is a diabetic with last A1c of 5.5.  The sugars are well controlled per patient.  She denies any other acute complaints.  Objective:  There were no vitals filed for this visit. Podiatric Exam: Vascular: dorsalis pedis and posterior tibial pulses are palpable bilateral. Capillary return is immediate. Temperature gradient is WNL. Skin turgor WNL  Sensorium: Normal Semmes Weinstein monofilament test. Normal tactile sensation bilaterally. Nail Exam: Pt has thick disfigured discolored nails with subungual debris noted bilateral entire nail hallux through fifth toenails Ulcer Exam: There is no evidence of ulcer or pre-ulcerative changes or infection. Orthopedic Exam: Muscle tone and strength are WNL. No limitations in general ROM. No crepitus or effusions noted. HAV  B/L.  Hammer toes 2-5  B/L. Skin: No Porokeratosis. No infection or ulcers  Assessment & Plan:  Patient was evaluated and treated and all questions answered.  Onychomycosis with pain  -Nails palliatively debrided as below. -Educated on self-care  Procedure: Nail Debridement Rationale: pain  Type of Debridement: manual, sharp debridement. Instrumentation: Nail nipper, rotary burr. Number of Nails: 10  Procedures and Treatment: Consent by patient was obtained for treatment procedures. The patient understood the discussion of treatment and procedures well. All questions were answered thoroughly reviewed. Debridement of mycotic and hypertrophic toenails, 1 through 5 bilateral and clearing of subungual debris. No ulceration,  no infection noted.  Return Visit-Office Procedure: Patient instructed to return to the office for a follow up visit 3 months for continued evaluation and treatment.  Nicholes Rough, DPM    No follow-ups on file.

## 2020-03-14 ENCOUNTER — Encounter: Payer: Self-pay | Admitting: Emergency Medicine

## 2020-03-14 ENCOUNTER — Emergency Department
Admission: EM | Admit: 2020-03-14 | Discharge: 2020-03-14 | Disposition: A | Discharge status: Home / Self Care | Payer: Medicare Other | Attending: Student in an Organized Health Care Education/Training Program | Admitting: Student in an Organized Health Care Education/Training Program

## 2020-03-14 ENCOUNTER — Other Ambulatory Visit: Payer: Self-pay

## 2020-03-14 ENCOUNTER — Emergency Department: Payer: Medicare Other

## 2020-03-14 DIAGNOSIS — Z87891 Personal history of nicotine dependence: Secondary | ICD-10-CM | POA: Diagnosis not present

## 2020-03-14 DIAGNOSIS — Y999 Unspecified external cause status: Secondary | ICD-10-CM | POA: Insufficient documentation

## 2020-03-14 DIAGNOSIS — Y939 Activity, unspecified: Secondary | ICD-10-CM | POA: Insufficient documentation

## 2020-03-14 DIAGNOSIS — R519 Headache, unspecified: Secondary | ICD-10-CM | POA: Insufficient documentation

## 2020-03-14 DIAGNOSIS — Z79899 Other long term (current) drug therapy: Secondary | ICD-10-CM | POA: Diagnosis not present

## 2020-03-14 DIAGNOSIS — Y92009 Unspecified place in unspecified non-institutional (private) residence as the place of occurrence of the external cause: Secondary | ICD-10-CM

## 2020-03-14 DIAGNOSIS — S0083XA Contusion of other part of head, initial encounter: Secondary | ICD-10-CM | POA: Insufficient documentation

## 2020-03-14 DIAGNOSIS — I10 Essential (primary) hypertension: Secondary | ICD-10-CM | POA: Insufficient documentation

## 2020-03-14 DIAGNOSIS — S80212A Abrasion, left knee, initial encounter: Secondary | ICD-10-CM | POA: Insufficient documentation

## 2020-03-14 DIAGNOSIS — E119 Type 2 diabetes mellitus without complications: Secondary | ICD-10-CM | POA: Insufficient documentation

## 2020-03-14 DIAGNOSIS — W06XXXA Fall from bed, initial encounter: Secondary | ICD-10-CM | POA: Diagnosis not present

## 2020-03-14 DIAGNOSIS — Y92003 Bedroom of unspecified non-institutional (private) residence as the place of occurrence of the external cause: Secondary | ICD-10-CM | POA: Insufficient documentation

## 2020-03-14 DIAGNOSIS — Z7984 Long term (current) use of oral hypoglycemic drugs: Secondary | ICD-10-CM | POA: Diagnosis not present

## 2020-03-14 DIAGNOSIS — S80912A Unspecified superficial injury of left knee, initial encounter: Secondary | ICD-10-CM | POA: Diagnosis present

## 2020-03-14 DIAGNOSIS — M25562 Pain in left knee: Secondary | ICD-10-CM

## 2020-03-14 MED ORDER — ACETAMINOPHEN 325 MG PO TABS
650.0000 mg | ORAL_TABLET | Freq: Once | ORAL | Status: AC
  Administered 2020-03-14: 650 mg via ORAL
  Filled 2020-03-14: qty 2

## 2020-03-14 MED ORDER — BACITRACIN-NEOMYCIN-POLYMYXIN 400-5-5000 EX OINT
TOPICAL_OINTMENT | Freq: Once | CUTANEOUS | Status: DC
  Filled 2020-03-14: qty 1

## 2020-03-14 NOTE — ED Provider Notes (Signed)
[facility] Emergency Department Provider Note   ____________________________________________   First MD Initiated Contact with Patient 03/14/20 1218     (approximate)  I have reviewed the triage vital signs and the nursing notes.   HISTORY  Chief Complaint Fall and Knee Pain    HPI Emily [name] is a 74 y.o. female patient complain of facial pain/edema and left knee pain secondary to a fall from bed.  Patient denies LOC.  Patient denies headache.  Patient arrived via EMS state unable to stand secondary to pain in the knee.  Patient rates the pain as a 9/10.  Patient described the pain is "achy".  No palliative measures for complaint.         Past Medical History:  Diagnosis Date   Essential hypertension, benign    Gout, unspecified    Mixed hyperlipidemia    Stroke [facility])    march 2017   Syncope and collapse    Type II or unspecified type diabetes mellitus without mention of complication, uncontrolled     Patient Active Problem List   Diagnosis Date Noted   Acute delirium    Bradycardia    Seizure ([place])    Traumatic subarachnoid hemorrhage with loss of consciousness of 30 minutes or less ([place]) 08/05/2019   Foot swelling 05/25/2019   Bilateral swelling of feet 10/26/2018   Swelling of joint of right hand 06/04/2018   Swelling of joint, wrist, right 06/04/2018   Headache disorder 06/18/2017   TIA (transient ischemic attack) 06/09/2017   Gouty arthritis 06/03/2017   Chronic tension-type headache, intractable 10/29/2016   History of CVA (cerebrovascular accident) 07/30/2016   Aphasia as late effect of cerebrovascular accident 01/23/2016   Transient alteration of awareness 01/23/2016   Acute sinusitis 01/02/2016   History of fall 01/02/2016   Increased frequency of urination 01/02/2016   Malaise 01/02/2016   Pain in joint involving ankle and foot 01/02/2016   Other specified counseling 01/02/2016   Acute  CVA (cerebrovascular accident) ([place]) 12/22/2015   Cerebral infarction ([place]) 12/22/2015   Syncope 12/21/2015   Hyperlipidemia 06/23/2013   Type 2 diabetes mellitus with hyperlipidemia ([place]) 06/23/2013   HTN (hypertension) 06/23/2013   Morbid obesity ([place]) 06/23/2013   Abnormal ECG 06/23/2013    Past Surgical History:  Procedure Laterality Date   ABDOMINAL HYSTERECTOMY     TEE WITHOUT CARDIOVERSION N/A 12/26/2015   Procedure: TRANSESOPHAGEAL ECHOCARDIOGRAM (TEE);  Surgeon: Teodoro Spray, MD;  Location: [facility] ORS;  Service: Cardiovascular;  Laterality: N/A;   TONSILLECTOMY AND ADENOIDECTOMY      Prior to Admission medications   Medication Sig Start Date End Date Taking? Authorizing Provider  acetaminophen (TYLENOL) 325 MG tablet Take 650 mg by mouth every 6 (six) hours as needed.    [provider]  allopurinol (ZYLOPRIM) 100 MG tablet Take 200 mg by mouth daily. 07/26/19   [provider]  atorvastatin (LIPITOR) 40 MG tablet Take 1 tablet (40 mg total) by mouth daily at 6 PM. 12/23/15   Demetrios Loll, MD  colchicine 0.6 MG tablet Take 1 tablet (0.6 mg total) by mouth 2 (two) times daily as needed. 08/08/19   Loletha Grayer, MD  furosemide (LASIX) 40 MG tablet Take 40 mg by mouth daily. 07/23/19   [provider]  lamoTRIgine (LAMICTAL) 25 MG tablet Take by mouth. 12/13/19   [provider]  levETIRAcetam (KEPPRA) 500 MG tablet Take 500 mg by mouth 2 (two) times daily. 07/26/19   [provider]  losartan (COZAAR) 100 MG tablet Take 100 mg by mouth daily. 10/24/19   [provider]  oxybutynin (DITROPAN) 5 MG tablet Take 5 mg by mouth 2 (two) times daily.    [provider]  polyethylene glycol (MIRALAX / GLYCOLAX) packet Take 17 g by mouth daily. Patient taking differently: Take 17 g by mouth daily as needed for moderate constipation.  12/26/15   Delfino Lovett, MD  potassium chloride SA (K-DUR,KLOR-CON) 20 MEQ tablet Take 1  tablet (20 mEq total) by mouth daily. 06/10/17   Milagros Loll, MD    Allergies Enalapril and Prednisone  Family History  Problem Relation Age of Onset   Heart disease Mother    Heart attack Mother    Hypertension Mother    Heart disease Father    Heart attack Father    Hypertension Father    Heart disease Brother    Stroke Brother    Heart attack Other     Social History Social History   Tobacco Use   Smoking status: Former Smoker   Smokeless tobacco: Never Used  Building services engineer Use: Never used  Substance Use Topics   Alcohol use: Yes   Drug use: No    Review of Systems Constitutional: No fever/chills Eyes: No visual changes. ENT: No sore throat. Cardiovascular: Denies chest pain. Respiratory: Denies shortness of breath. Gastrointestinal: No abdominal pain.  No nausea, no vomiting.  No diarrhea.  No constipation. Genitourinary: Negative for dysuria. Musculoskeletal: Left knee pain. Skin: Negative for rash.  Facial contusion Neurological: Negative for headaches, focal weakness or numbness. Endocrine:  Diabetes, hyperlipidemia, hypertension. Allergic/Immunilogical: Enalapril and prednisone ____________________________________________   PHYSICAL EXAM:  VITAL SIGNS: ED Triage Vitals  Enc Vitals Group     BP 03/14/20 1214 (!) 157/71     Pulse Rate 03/14/20 1214 (!) 53     Resp 03/14/20 1214 18     Temp 03/14/20 1217 97.8 F (36.6 C)     Temp src --      SpO2 03/14/20 1214 97 %     Weight 03/14/20 1214 206 lb (93.4 kg)     Height 03/14/20 1214 5\' 3"  (1.6 m)     Head Circumference --      Peak Flow --      Pain Score 03/14/20 1213 9     Pain Loc --      Pain Edu? --      Excl. in GC? --    Constitutional: Alert and oriented. Well appearing and in no acute distress. Eyes: Conjunctivae are normal. PERRL. EOMI. Head: Atraumatic. Nose: No congestion/rhinnorhea. Mouth/Throat: Mucous membranes are moist.  Oropharynx  non-erythematous. Neck: No stridor.  No cervical spine tenderness to palpation. Hematological/Lymphatic/Immunilogical: No cervical lymphadenopathy. Cardiovascular: Normal rate, regular rhythm. Grossly normal heart sounds.  Good peripheral circulation. Respiratory: Normal respiratory effort.  No retractions. Lungs CTAB. Musculoskeletal: Moderate guarding palpation of left anterior patella.   Neurologic:  Normal speech and language. No gross focal neurologic deficits are appreciated. No gait instability. Skin:  Skin is warm, dry and intact. No rash noted.  Small hematoma lateral supraorbital area. Psychiatric: Mood and affect are normal. Speech and behavior are normal.  ____________________________________________   LABS (all labs ordered are listed, but only abnormal results are displayed)  Labs Reviewed - No data to display ____________________________________________  EKG   ____________________________________________  RADIOLOGY  ED MD interpretation:    Official radiology report(s): DG Knee Complete 4 Views Left  Result Date:  03/14/2020 CLINICAL DATA:  Left knee pain following fall, initial encounter EXAM: LEFT KNEE - COMPLETE 4+ VIEW COMPARISON:  None. FINDINGS: Degenerative changes are noted most marked in the medial joint space. No acute fracture or dislocation is seen. No joint effusion is noted. IMPRESSION: Degenerative change without acute abnormality. Electronically Signed   By: Alcide Clever M.D.   On: 03/14/2020 13:23   CT Maxillofacial Wo Contrast  Result Date: 03/14/2020 CLINICAL DATA:  Fall mild left facial pain EXAM: CT MAXILLOFACIAL WITHOUT CONTRAST TECHNIQUE: Multidetector CT imaging of the maxillofacial structures was performed. Multiplanar CT image reconstructions were also generated. COMPARISON:  2020 FINDINGS: Osseous: There is no acute facial fracture. Degenerative changes at the left temporomandibular joint with flattening of the mandibular condyle. Multiple  dental caries and periapical lucencies. Orbits: No intraorbital hematoma. Sinuses: Minor mucosal thickening. Soft tissues: No significant abnormality. Limited intracranial: No acute abnormality. IMPRESSION: No acute facial fracture. Electronically Signed   By: Guadlupe Spanish M.D.   On: 03/14/2020 13:16    ____________________________________________   PROCEDURES  Procedure(s) performed (including Critical Care):  Procedures   ____________________________________________   INITIAL IMPRESSION / ASSESSMENT AND PLAN / ED COURSE  As part of my medical decision making, I reviewed the following data within the electronic MEDICAL RECORD NUMBER     Patient presents with left facial and left knee pain secondary to fall from bed this morning.  Patient denies LOC.  Patient denies head injury.  Physical exam is unremarkable except for an abrasion to the left knee.  Discussed CT and x-ray findings with patient which are unremarkable.  Patient given discharge care instruction advised continue previous medication.  Advised extra strength Tylenol as needed for pain.    Emily [name] was evaluated in Emergency Department on 03/14/2020 for the symptoms described in the history of present illness. She was evaluated in the context of the global COVID-19 pandemic, which necessitated consideration that the patient might be at risk for infection with the SARS-CoV-2 virus that causes COVID-19. Institutional protocols and algorithms that pertain to the evaluation of patients at risk for COVID-19 are in a state of rapid change based on information released by regulatory bodies including the CDC and federal and state organizations. These policies and algorithms were followed during the patient's care in the ED.       ____________________________________________   FINAL CLINICAL IMPRESSION(S) / ED DIAGNOSES  Final diagnoses:  Fall in home, initial encounter  Acute pain of left knee  Abrasion of left knee,  initial encounter  Contusion of face, initial encounter     ED Discharge Orders    None       Note:  This document was prepared using Dragon voice recognition software and may include unintentional dictation errors.    Joni Reining, PA-C 03/14/20 1340    Willy Eddy, MD 03/14/20 1345

## 2020-03-14 NOTE — ED Triage Notes (Signed)
Presents vis EMS from home  States she fell from bed this am  Was not able to get herself up  Having pain to left knee and left side of head

## 2020-03-14 NOTE — ED Notes (Signed)
Update given to son

## 2020-03-14 NOTE — Discharge Instructions (Signed)
No acute findings on facial CT and x-ray of the left knee.  Follow discharge care instructions.  Advised extra strength Tylenol as needed for pain.

## 2020-04-18 ENCOUNTER — Ambulatory Visit: Payer: Medicare Other | Admitting: Podiatry

## 2020-08-09 ENCOUNTER — Emergency Department
Admission: EM | Admit: 2020-08-09 | Discharge: 2020-08-09 | Disposition: A | Discharge status: Home / Self Care | Payer: Medicare Other | Attending: Emergency Medicine | Admitting: Emergency Medicine

## 2020-08-09 ENCOUNTER — Emergency Department: Payer: Medicare Other

## 2020-08-09 ENCOUNTER — Other Ambulatory Visit: Payer: Self-pay

## 2020-08-09 DIAGNOSIS — Z79899 Other long term (current) drug therapy: Secondary | ICD-10-CM | POA: Insufficient documentation

## 2020-08-09 DIAGNOSIS — R600 Localized edema: Secondary | ICD-10-CM | POA: Insufficient documentation

## 2020-08-09 DIAGNOSIS — Z87891 Personal history of nicotine dependence: Secondary | ICD-10-CM | POA: Insufficient documentation

## 2020-08-09 DIAGNOSIS — E119 Type 2 diabetes mellitus without complications: Secondary | ICD-10-CM | POA: Insufficient documentation

## 2020-08-09 DIAGNOSIS — Z8673 Personal history of transient ischemic attack (TIA), and cerebral infarction without residual deficits: Secondary | ICD-10-CM | POA: Insufficient documentation

## 2020-08-09 DIAGNOSIS — R569 Unspecified convulsions: Secondary | ICD-10-CM | POA: Diagnosis not present

## 2020-08-09 DIAGNOSIS — I10 Essential (primary) hypertension: Secondary | ICD-10-CM | POA: Diagnosis not present

## 2020-08-09 DIAGNOSIS — R4182 Altered mental status, unspecified: Secondary | ICD-10-CM | POA: Diagnosis present

## 2020-08-09 LAB — URINALYSIS, COMPLETE (UACMP) WITH MICROSCOPIC
Bilirubin Urine: NEGATIVE
Glucose, UA: NEGATIVE mg/dL
Hgb urine dipstick: NEGATIVE
Ketones, ur: NEGATIVE mg/dL
Leukocytes,Ua: NEGATIVE
Nitrite: NEGATIVE
Protein, ur: NEGATIVE mg/dL
Specific Gravity, Urine: 1.014 (ref 1.005–1.030)
pH: 5 (ref 5.0–8.0)

## 2020-08-09 LAB — BASIC METABOLIC PANEL
Anion gap: 10 (ref 5–15)
BUN: 21 mg/dL (ref 8–23)
CO2: 30 mmol/L (ref 22–32)
Calcium: 9.9 mg/dL (ref 8.9–10.3)
Chloride: 98 mmol/L (ref 98–111)
Creatinine, Ser: 1.26 mg/dL — ABNORMAL HIGH (ref 0.44–1.00)
GFR, Estimated: 46 mL/min — ABNORMAL LOW (ref >60)
Glucose, Bld: 149 mg/dL — ABNORMAL HIGH (ref 70–99)
Potassium: 3.8 mmol/L (ref 3.5–5.1)
Sodium: 138 mmol/L (ref 135–145)

## 2020-08-09 LAB — CBC
HCT: 42.7 % (ref 36.0–46.0)
Hemoglobin: 13.9 g/dL (ref 12.0–15.0)
MCH: 28.5 pg (ref 26.0–34.0)
MCHC: 32.6 g/dL (ref 30.0–36.0)
MCV: 87.7 fL (ref 80.0–100.0)
Platelets: 187 10*3/uL (ref 150–400)
RBC: 4.87 MIL/uL (ref 3.87–5.11)
RDW: 12.7 % (ref 11.5–15.5)
WBC: 5.2 10*3/uL (ref 4.0–10.5)
nRBC: 0 % (ref 0.0–0.2)

## 2020-08-09 LAB — CBG MONITORING, ED: Glucose-Capillary: 95 mg/dL (ref 70–99)

## 2020-08-09 NOTE — ED Notes (Signed)
Pt assisted to toilet with min assist

## 2020-08-09 NOTE — ED Provider Notes (Signed)
[facility] Emergency Department Provider Note   ____________________________________________   First MD Initiated Contact with Patient 08/09/20 1957     (approximate)  I have reviewed the triage vital signs and the nursing notes.   HISTORY  Chief Complaint Weakness and Altered Mental Status    HPI Emily [name] is a 74 y.o. female with past medical history of hypertension, hyperlipidemia, diabetes, stroke, and seizures who presents to the ED for altered mental status.  History is limited as patient does not remember the episode today that led to EMS being called.  She states that her grandson thought she might of had a seizure because she was staring off and did not respond for a short period of time.  She states she does not remember what happened, but felt like she was back to normal by the time EMS arrived.  EMS reported that patient was unable to answer questions and that was why her son called EMS.  She currently states she feels back to normal with no headache, numbness, or weakness.  She denies any recent fevers, cough, chest pain, shortness of breath, dysuria, or hematuria.  She has been taking seizure medication as prescribed.        Past Medical History:  Diagnosis Date  . Essential hypertension, benign   . Gout, unspecified   . Mixed hyperlipidemia   . Stroke [facility])    march 2017  . Syncope and collapse   . Type II or unspecified type diabetes mellitus without mention of complication, uncontrolled     Patient Active Problem List   Diagnosis Date Noted  . Acute delirium   . Bradycardia   . Seizure ([facility])   . Traumatic subarachnoid hemorrhage with loss of consciousness of 30 minutes or less ([facility]) 08/05/2019  . Foot swelling 05/25/2019  . Bilateral swelling of feet 10/26/2018  . Swelling of joint of right hand 06/04/2018  . Swelling of joint, wrist, right 06/04/2018  . Headache disorder 06/18/2017  . TIA (transient ischemic attack)  06/09/2017  . Gouty arthritis 06/03/2017  . Chronic tension-type headache, intractable 10/29/2016  . History of CVA (cerebrovascular accident) 07/30/2016  . Aphasia as late effect of cerebrovascular accident 01/23/2016  . Transient alteration of awareness 01/23/2016  . Acute sinusitis 01/02/2016  . History of fall 01/02/2016  . Increased frequency of urination 01/02/2016  . Malaise 01/02/2016  . Pain in joint involving ankle and foot 01/02/2016  . Other specified counseling 01/02/2016  . Acute CVA (cerebrovascular accident) ([facility]) 12/22/2015  . Cerebral infarction ([facility]) 12/22/2015  . Syncope 12/21/2015  . Hyperlipidemia 06/23/2013  . Type 2 diabetes mellitus with hyperlipidemia ([facility]) 06/23/2013  . HTN (hypertension) 06/23/2013  . Morbid obesity ([facility]) 06/23/2013  . Abnormal ECG 06/23/2013    Past Surgical History:  Procedure Laterality Date  . ABDOMINAL HYSTERECTOMY    . TEE WITHOUT CARDIOVERSION N/A 12/26/2015   Procedure: TRANSESOPHAGEAL ECHOCARDIOGRAM (TEE);  Surgeon: Dalia Heading, MD;  Location: [facility] ORS;  Service: Cardiovascular;  Laterality: N/A;  . TONSILLECTOMY AND ADENOIDECTOMY      Prior to Admission medications   Medication Sig Start Date End Date Taking? Authorizing Provider  acetaminophen (TYLENOL) 325 MG tablet Take 650 mg by mouth every 6 (six) hours as needed.    [provider]  allopurinol (ZYLOPRIM) 100 MG tablet Take 200 mg by mouth daily. 07/26/19   [provider]  atorvastatin (LIPITOR) 40 MG tablet Take 1 tablet (40 mg total) by mouth daily at  6 PM. 12/23/15   Shaune Pollack, MD  colchicine 0.6 MG tablet Take 1 tablet (0.6 mg total) by mouth 2 (two) times daily as needed. 08/08/19   Alford Highland, MD  furosemide (LASIX) 40 MG tablet Take 40 mg by mouth daily. 07/23/19   [provider]  lamoTRIgine (LAMICTAL) 25 MG tablet Take by mouth. 12/13/19   [provider]  levETIRAcetam (KEPPRA) 500 MG tablet Take 500 mg by mouth 2  (two) times daily. 07/26/19   [provider]  losartan (COZAAR) 100 MG tablet Take 100 mg by mouth daily. 10/24/19   [provider]  oxybutynin (DITROPAN) 5 MG tablet Take 5 mg by mouth 2 (two) times daily.    [provider]  polyethylene glycol (MIRALAX / GLYCOLAX) packet Take 17 g by mouth daily. Patient taking differently: Take 17 g by mouth daily as needed for moderate constipation.  12/26/15   Delfino Lovett, MD  potassium chloride SA (K-DUR,KLOR-CON) 20 MEQ tablet Take 1 tablet (20 mEq total) by mouth daily. 06/10/17   Milagros Loll, MD    Allergies Enalapril and Prednisone  Family History  Problem Relation Age of Onset  . Heart disease Mother   . Heart attack Mother   . Hypertension Mother   . Heart disease Father   . Heart attack Father   . Hypertension Father   . Heart disease Brother   . Stroke Brother   . Heart attack Other     Social History Social History   Tobacco Use  . Smoking status: Former Games developer  . Smokeless tobacco: Never Used  Vaping Use  . Vaping Use: Never used  Substance Use Topics  . Alcohol use: Yes  . Drug use: No    Review of Systems  Constitutional: No fever/chills Eyes: No visual changes. ENT: No sore throat. Cardiovascular: Denies chest pain. Respiratory: Denies shortness of breath. Gastrointestinal: No abdominal pain.  No nausea, no vomiting.  No diarrhea.  No constipation. Genitourinary: Negative for dysuria. Musculoskeletal: Negative for back pain. Skin: Negative for rash. Neurological: Negative for headaches, focal weakness or numbness.  Positive for episode of unresponsiveness.  ____________________________________________   PHYSICAL EXAM:  VITAL SIGNS: ED Triage Vitals  Enc Vitals Group     BP 08/09/20 1727 130/62     Pulse Rate 08/09/20 1727 60     Resp 08/09/20 1727 18     Temp 08/09/20 1727 (!) 97.4 F (36.3 C)     Temp Source 08/09/20 1727 Oral     SpO2 08/09/20 1727 98 %     Weight  08/09/20 1728 216 lb (98 kg)     Height 08/09/20 1728 5\' 3"  (1.6 m)     Head Circumference --      Peak Flow --      Pain Score 08/09/20 1728 0     Pain Loc --      Pain Edu? --      Excl. in GC? --     Constitutional: Alert and oriented. Eyes: Conjunctivae are normal. Head: Atraumatic. Nose: No congestion/rhinnorhea. Mouth/Throat: Mucous membranes are moist. Neck: Normal ROM Cardiovascular: Bradycardic, regular rhythm. Grossly normal heart sounds. Respiratory: Normal respiratory effort.  No retractions. Lungs CTAB. Gastrointestinal: Soft and nontender. No distention. Genitourinary: deferred Musculoskeletal: No lower extremity tenderness, nonpitting edema to knees bilaterally. Neurologic:  Normal speech and language. No gross focal neurologic deficits are appreciated. Skin:  Skin is warm, dry and intact. No rash noted. Psychiatric: Mood and affect are normal. Speech  and behavior are normal.  ____________________________________________   LABS (all labs ordered are listed, but only abnormal results are displayed)  Labs Reviewed  BASIC METABOLIC PANEL - Abnormal; Notable for the following components:      Result Value   Glucose, Bld 149 (*)    Creatinine, Ser 1.26 (*)    GFR, Estimated 46 (*)    All other components within normal limits  URINALYSIS, COMPLETE (UACMP) WITH MICROSCOPIC - Abnormal; Notable for the following components:   Color, Urine YELLOW (*)    APPearance CLEAR (*)    Bacteria, UA RARE (*)    All other components within normal limits  CBC  CBG MONITORING, ED   ____________________________________________  EKG  ED ECG REPORT I, Chesley Noonharles Diavion Labrador, the attending physician, personally viewed and interpreted this ECG.   Date: 08/09/2020  EKG Time: 17:25  Rate: 57  Rhythm: sinus bradycardia  Axis: Normal  Intervals:none  ST&T Change: None   PROCEDURES  Procedure(s) performed (including Critical Care):  .1-3 Lead EKG Interpretation Performed by:  Chesley NoonJessup, Rudell Marlowe, MD Authorized by: Chesley NoonJessup, Jiovanna Frei, MD     Interpretation: abnormal     ECG rate:  54   ECG rate assessment: bradycardic     Rhythm: sinus bradycardia     Ectopy: none     Conduction: normal       ____________________________________________   INITIAL IMPRESSION / ASSESSMENT AND PLAN / ED COURSE       74 year old female with past medical history of hypertension, hyperlipidemia, diabetes, stroke, and seizures who presents to the ED for episode of decreased responsiveness that family was concerned could represent a seizure.  On arrival to the ED, she is awake and alert with no alteration in her mental status.  No focal deficits noted on exam but given this episode of unresponsiveness we will check CT head.  Lab work thus far is unremarkable, UA is pending.  EKG shows sinus bradycardia with no ischemic changes.  Patient has a long history of sinus bradycardia which has been present on prior ED visits.  Doubt her episode today was related to bradycardia but we will observe on the cardiac monitor.  I have attempted to reach family via phone but have been unsuccessful.  I was able to reach patient's sister over the phone, who spoke with patient's 72 year old grandson, who was the only witness to the episode.  Episode staring off and decreased responsiveness is typical for her seizure episodes and patient sister states she had been off of her seizure medications for 2 days until restarting them earlier today.  I suspect patient had a seizure today due to medication noncompliance, we will continue to observe here in the ED to ensure no further episodes.  If remainder of work-up is negative, patient may be discharged to the care of family with neurology follow-up.  CT head reviewed by me and no obvious hemorrhage, negative for acute process per radiology. UA is unremarkable and patient with no further seizure episodes here in the ED. Seizure episode today likely due to recent  medication noncompliance, patient reportedly restarted keppra earlier today per family. She is appropriate for discharge home with Neurology follow-up, patient agrees with plan.      ____________________________________________   FINAL CLINICAL IMPRESSION(S) / ED DIAGNOSES  Final diagnoses:  Seizure [facility](HCC)     ED Discharge Orders    None       Note:  This document was prepared using Dragon voice recognition software and may include unintentional dictation  errors.   Chesley Noon, MD 08/09/20 2235

## 2020-08-09 NOTE — ED Triage Notes (Signed)
Pt here with weakness that started today when her son was talking to her and she was unable to answer him and does not remember what or why she was unable to talk. Pt denies numbness, tingling, or headache. Pt NAD in triage.

## 2020-08-09 NOTE — ED Notes (Signed)
Pt endorses that they had a seizure today. Pt has history of seizure. Pt endorses episode occurred at 4pm today.  Pt believes they are back to baseline at this time. No concerns or pain.

## 2020-11-02 ENCOUNTER — Encounter: Payer: Self-pay | Admitting: Podiatry

## 2020-11-02 ENCOUNTER — Ambulatory Visit (INDEPENDENT_AMBULATORY_CARE_PROVIDER_SITE_OTHER): Payer: Medicare Other | Admitting: Podiatry

## 2020-11-02 ENCOUNTER — Other Ambulatory Visit: Payer: Self-pay

## 2020-11-02 DIAGNOSIS — L853 Xerosis cutis: Secondary | ICD-10-CM | POA: Diagnosis not present

## 2020-11-02 DIAGNOSIS — M79674 Pain in right toe(s): Secondary | ICD-10-CM

## 2020-11-02 DIAGNOSIS — M79675 Pain in left toe(s): Secondary | ICD-10-CM

## 2020-11-02 DIAGNOSIS — B351 Tinea unguium: Secondary | ICD-10-CM | POA: Diagnosis not present

## 2020-11-02 DIAGNOSIS — E1169 Type 2 diabetes mellitus with other specified complication: Secondary | ICD-10-CM

## 2020-11-02 DIAGNOSIS — E785 Hyperlipidemia, unspecified: Secondary | ICD-10-CM

## 2020-11-02 NOTE — Progress Notes (Signed)
  Subjective:  Patient ID: Emily [name], female    DOB: 23-Aug-1946,  MRN: [id number]  Chief Complaint  Patient presents with  . Nail Problem  . Diabetes    Nail trim [facility]   75 y.o. female returns for the above complaint.  Patient presents with thickened elongated dystrophic toenails x10.  They are painful in nature.  She would like to have them debrided down.  Patient is a diabetic with last A1c of 5.5.  The sugars are well controlled per patient.  She has secondary complaint dry skin to both lower extremity.  She would like to know if there is anything that can be done for this.  She does apply some over-the-counter lotion but she does not do it twice a day.  She denies any other acute complaints.  Objective:  There were no vitals filed for this visit. Podiatric Exam: Vascular: dorsalis pedis and posterior tibial pulses are palpable bilateral. Capillary return is immediate. Temperature gradient is WNL. Skin turgor WNL  Sensorium: Normal Semmes Weinstein monofilament test. Normal tactile sensation bilaterally. Nail Exam: Pt has thick disfigured discolored nails with subungual debris noted bilateral entire nail hallux through fifth toenails Ulcer Exam: There is no evidence of ulcer or pre-ulcerative changes or infection. Orthopedic Exam: Muscle tone and strength are WNL. No limitations in general ROM. No crepitus or effusions noted. HAV  B/L.  Hammer toes 2-5  B/L. Skin: No Porokeratosis. No infection or ulcers.  Dry skin noted with a subjective component of itching noted to bilateral foot  Assessment & Plan:  Patient was evaluated and treated and all questions answered.  Xerosis bilateral foot -I explained to the patient the etiology of xerosis and various treatment options were extensively discussed.  I explained to the patient the importance of maintaining moisturization of the skin with application of over-the-counter lotion such as Eucerin or Luciderm.  I have asked the patient to  apply this twice a day.  If unable to resolve patient will benefit from prescription lotion.   Onychomycosis with pain  -Nails palliatively debrided as below. -Educated on self-care  Procedure: Nail Debridement Rationale: pain  Type of Debridement: manual, sharp debridement. Instrumentation: Nail nipper, rotary burr. Number of Nails: 10  Procedures and Treatment: Consent by patient was obtained for treatment procedures. The patient understood the discussion of treatment and procedures well. All questions were answered thoroughly reviewed. Debridement of mycotic and hypertrophic toenails, 1 through 5 bilateral and clearing of subungual debris. No ulceration, no infection noted.  Return Visit-Office Procedure: Patient instructed to return to the office for a follow up visit 3 months for continued evaluation and treatment.  Nicholes Rough, DPM    No follow-ups on file.

## 2020-11-07 ENCOUNTER — Emergency Department: Payer: Medicare Other

## 2020-11-07 ENCOUNTER — Emergency Department
Admission: EM | Admit: 2020-11-07 | Discharge: 2020-11-07 | Disposition: A | Discharge status: Home / Self Care | Payer: Medicare Other | Attending: Emergency Medicine | Admitting: Emergency Medicine

## 2020-11-07 ENCOUNTER — Other Ambulatory Visit: Payer: Self-pay

## 2020-11-07 DIAGNOSIS — E119 Type 2 diabetes mellitus without complications: Secondary | ICD-10-CM | POA: Diagnosis not present

## 2020-11-07 DIAGNOSIS — R519 Headache, unspecified: Secondary | ICD-10-CM | POA: Diagnosis not present

## 2020-11-07 DIAGNOSIS — Z87891 Personal history of nicotine dependence: Secondary | ICD-10-CM | POA: Insufficient documentation

## 2020-11-07 DIAGNOSIS — Z79899 Other long term (current) drug therapy: Secondary | ICD-10-CM | POA: Insufficient documentation

## 2020-11-07 DIAGNOSIS — I10 Essential (primary) hypertension: Secondary | ICD-10-CM | POA: Insufficient documentation

## 2020-11-07 DIAGNOSIS — Z8673 Personal history of transient ischemic attack (TIA), and cerebral infarction without residual deficits: Secondary | ICD-10-CM | POA: Diagnosis not present

## 2020-11-07 LAB — COMPREHENSIVE METABOLIC PANEL
ALT: 11 U/L (ref 0–44)
AST: 19 U/L (ref 15–41)
Albumin: 3.7 g/dL (ref 3.5–5.0)
Alkaline Phosphatase: 90 U/L (ref 38–126)
Anion gap: 9 (ref 5–15)
BUN: 18 mg/dL (ref 8–23)
CO2: 25 mmol/L (ref 22–32)
Calcium: 9.6 mg/dL (ref 8.9–10.3)
Chloride: 104 mmol/L (ref 98–111)
Creatinine, Ser: 0.91 mg/dL (ref 0.44–1.00)
GFR, Estimated: >60 mL/min (ref >60)
Glucose, Bld: 138 mg/dL — ABNORMAL HIGH (ref 70–99)
Potassium: 3.9 mmol/L (ref 3.5–5.1)
Sodium: 138 mmol/L (ref 135–145)
Total Bilirubin: 0.4 mg/dL (ref 0.3–1.2)
Total Protein: 7.3 g/dL (ref 6.5–8.1)

## 2020-11-07 LAB — CBC
HCT: 42.3 % (ref 36.0–46.0)
Hemoglobin: 13.8 g/dL (ref 12.0–15.0)
MCH: 28.9 pg (ref 26.0–34.0)
MCHC: 32.6 g/dL (ref 30.0–36.0)
MCV: 88.5 fL (ref 80.0–100.0)
Platelets: 175 10*3/uL (ref 150–400)
RBC: 4.78 MIL/uL (ref 3.87–5.11)
RDW: 12.4 % (ref 11.5–15.5)
WBC: 4.1 10*3/uL (ref 4.0–10.5)
nRBC: 0 % (ref 0.0–0.2)

## 2020-11-07 LAB — DIFFERENTIAL
Abs Immature Granulocytes: 0.02 10*3/uL (ref 0.00–0.07)
Basophils Absolute: 0 10*3/uL (ref 0.0–0.1)
Basophils Relative: 1 %
Eosinophils Absolute: 0.2 10*3/uL (ref 0.0–0.5)
Eosinophils Relative: 4 %
Immature Granulocytes: 1 %
Lymphocytes Relative: 21 %
Lymphs Abs: 0.9 10*3/uL (ref 0.7–4.0)
Monocytes Absolute: 0.4 10*3/uL (ref 0.1–1.0)
Monocytes Relative: 10 %
Neutro Abs: 2.7 10*3/uL (ref 1.7–7.7)
Neutrophils Relative %: 63 %

## 2020-11-07 LAB — PROTIME-INR
INR: 1.1 (ref 0.8–1.2)
Prothrombin Time: 13.6 seconds (ref 11.4–15.2)

## 2020-11-07 LAB — APTT: aPTT: 29 seconds (ref 24–36)

## 2020-11-07 MED ORDER — SODIUM CHLORIDE 0.9 % IV BOLUS
1000.0000 mL | Freq: Once | INTRAVENOUS | Status: AC
  Administered 2020-11-07: 1000 mL via INTRAVENOUS

## 2020-11-07 MED ORDER — KETOROLAC TROMETHAMINE 30 MG/ML IJ SOLN
15.0000 mg | Freq: Once | INTRAMUSCULAR | Status: AC
  Administered 2020-11-07: 15 mg via INTRAVENOUS
  Filled 2020-11-07: qty 1

## 2020-11-07 MED ORDER — METOCLOPRAMIDE HCL 5 MG/ML IJ SOLN
10.0000 mg | Freq: Once | INTRAMUSCULAR | Status: AC
  Administered 2020-11-07: 10 mg via INTRAVENOUS
  Filled 2020-11-07: qty 2

## 2020-11-07 NOTE — ED Notes (Signed)
Pt states she is feeling better and wants to go home. A&O, speech clear. Able to move all limbs.

## 2020-11-07 NOTE — ED Triage Notes (Signed)
Pt comes pov with headache for 3 days that has caused some dizziness and falls. Pt not on thinners except aspirin. States headache is on middle top of her head.

## 2020-11-07 NOTE — ED Provider Notes (Signed)
[facility] Emergency Department Provider Note  Time seen: 8:22 PM  I have reviewed the triage vital signs and the nursing notes.   HISTORY  Chief Complaint Headache and Fall   HPI Emily [name] is a 75 y.o. female a past medical history of hypertension, hyperlipidemia, prior CVA, presents to the emergency department for headache.  According to the patient for the past 3 days she has been experiencing a headache that is intermittent and affects different areas of her head.  Patient denies any weakness numbness confusion or slurred speech.  Denies any fever cough congestion or shortness of breath.   Past Medical History:  Diagnosis Date  . Essential hypertension, benign   . Gout, unspecified   . Mixed hyperlipidemia   . Stroke [facility])    march 2017  . Syncope and collapse   . Type II or unspecified type diabetes mellitus without mention of complication, uncontrolled     Patient Active Problem List   Diagnosis Date Noted  . Acute delirium   . Bradycardia   . Seizure (HCC)   . Traumatic subarachnoid hemorrhage with loss of consciousness of 30 minutes or less (HCC) 08/05/2019  . Foot swelling 05/25/2019  . Bilateral swelling of feet 10/26/2018  . Swelling of joint of right hand 06/04/2018  . Swelling of joint, wrist, right 06/04/2018  . Headache disorder 06/18/2017  . TIA (transient ischemic attack) 06/09/2017  . Gouty arthritis 06/03/2017  . Chronic tension-type headache, intractable 10/29/2016  . History of CVA (cerebrovascular accident) 07/30/2016  . Aphasia as late effect of cerebrovascular accident 01/23/2016  . Transient alteration of awareness 01/23/2016  . Acute sinusitis 01/02/2016  . History of fall 01/02/2016  . Increased frequency of urination 01/02/2016  . Malaise 01/02/2016  . Pain in joint involving ankle and foot 01/02/2016  . Other specified counseling 01/02/2016  . Acute CVA (cerebrovascular accident) (HCC) 12/22/2015  .  Cerebral infarction (HCC) 12/22/2015  . Syncope 12/21/2015  . Hyperlipidemia 06/23/2013  . Type 2 diabetes mellitus with hyperlipidemia (HCC) 06/23/2013  . HTN (hypertension) 06/23/2013  . Morbid obesity (HCC) 06/23/2013  . Abnormal ECG 06/23/2013    Past Surgical History:  Procedure Laterality Date  . ABDOMINAL HYSTERECTOMY    . TEE WITHOUT CARDIOVERSION N/A 12/26/2015   Procedure: TRANSESOPHAGEAL ECHOCARDIOGRAM (TEE);  Surgeon: Dalia Heading, MD;  Location: [facility] ORS;  Service: Cardiovascular;  Laterality: N/A;  . TONSILLECTOMY AND ADENOIDECTOMY      Prior to Admission medications   Medication Sig Start Date End Date Taking? Authorizing Provider  acetaminophen (TYLENOL) 325 MG tablet Take 650 mg by mouth every 6 (six) hours as needed.    [provider]  allopurinol (ZYLOPRIM) 100 MG tablet Take 200 mg by mouth daily. 07/26/19   [provider]  atorvastatin (LIPITOR) 40 MG tablet Take 1 tablet (40 mg total) by mouth daily at 6 PM. 12/23/15   Shaune Pollack, MD  colchicine 0.6 MG tablet Take 1 tablet (0.6 mg total) by mouth 2 (two) times daily as needed. 08/08/19   Alford Highland, MD  furosemide (LASIX) 40 MG tablet Take 40 mg by mouth daily. 07/23/19   [provider]  lamoTRIgine (LAMICTAL) 25 MG tablet Take by mouth. 12/13/19   [provider]  levETIRAcetam (KEPPRA) 500 MG tablet Take 500 mg by mouth 2 (two) times daily. 07/26/19   [provider]  losartan (COZAAR) 100 MG tablet Take 100 mg by mouth daily. 10/24/19   [provider]  oxybutynin (DITROPAN) 5 MG tablet Take 5 mg by mouth 2 (two) times daily.    [provider]  polyethylene glycol (MIRALAX / GLYCOLAX) packet Take 17 g by mouth daily. Patient taking differently: Take 17 g by mouth daily as needed for moderate constipation.  12/26/15   Delfino Lovett, MD  potassium chloride SA (K-DUR,KLOR-CON) 20 MEQ tablet Take 1 tablet (20 mEq total) by mouth daily. 06/10/17   Milagros Loll, MD    Allergies  Allergen Reactions  . Enalapril Other (See Comments) and Shortness Of Breath    Unknown chest tightness  . Prednisone Palpitations    Family History  Problem Relation Age of Onset  . Heart disease Mother   . Heart attack Mother   . Hypertension Mother   . Heart disease Father   . Heart attack Father   . Hypertension Father   . Heart disease Brother   . Stroke Brother   . Heart attack Other     Social History Social History   Tobacco Use  . Smoking status: Former Games developer  . Smokeless tobacco: Never Used  Vaping Use  . Vaping Use: Never used  Substance Use Topics  . Alcohol use: Yes  . Drug use: No    Review of Systems Constitutional: Negative for fever. Cardiovascular: Negative for chest pain. Respiratory: Negative for shortness of breath. Gastrointestinal: Negative for abdominal pain Musculoskeletal: Negative for musculoskeletal complaints Neurological: Intermittent moderate headache x3 days. All other ROS negative  ____________________________________________   PHYSICAL EXAM:  VITAL SIGNS: ED Triage Vitals [11/07/20 1512]  Enc Vitals Group     BP (!) 155/79     Pulse Rate 65     Resp 18     Temp 98.4 F (36.9 C)     Temp Source Oral     SpO2 100 %     Weight 212 lb (96.2 kg)     Height 5\' 2"  (1.575 m)     Head Circumference      Peak Flow      Pain Score 9     Pain Loc      Pain Edu?      Excl. in GC?     Constitutional: Alert and oriented. Well appearing and in no distress. Eyes: Normal exam ENT      Head: Normocephalic and atraumatic.      Mouth/Throat: Mucous membranes are moist. Cardiovascular: Normal rate, regular rhythm.  Respiratory: Normal respiratory effort without tachypnea nor retractions. Breath sounds are clear Gastrointestinal: Soft and nontender. No distention.   Musculoskeletal: Nontender with normal range of motion in all extremities.  Neurologic:  Normal speech and language. No gross focal  neurologic deficits.  Equal grip strength bilaterally.  No pronator drift.  5/5 motor in all extremities.  No cranial nerve deficits. Skin:  Skin is warm, dry and intact.  Psychiatric: Mood and affect are normal.   ____________________________________________    EKG  EKG viewed and interpreted by myself shows a normal sinus rhythm at 60 bpm with a narrow QRS, left axis deviation, largely normal intervals, nonspecific ST changes.  ____________________________________________    RADIOLOGY  CT scan of the head shows no acute abnormality.  Extensive microvascular chronic changes.  ____________________________________________   INITIAL IMPRESSION / ASSESSMENT AND PLAN / ED COURSE  Pertinent labs & imaging results that were available during my care of the patient were reviewed by me and considered in my medical decision making (see chart for details).   Patient presents to  the emergency department for headache intermittent over the past 3 days.  Currently states mild headache.  Patient has a reassuring physical and neurological exam.  CT scan of the head is negative for acute abnormality.  Lab work largely reassuring.  We will treat the patient's headache in the emergency department and reassess.  After headache treatment and fluids patient denies any headache.  States she is hungry and is ready to go home.  Given the patient's reassuring work-up I believe it is safe to discharge patient home with PCP follow-up.  Provided my typical headache return precautions.  Patient agreeable to plan of care.  Emily [name] was evaluated in Emergency Department on 11/07/2020 for the symptoms described in the history of present illness. She was evaluated in the context of the global COVID-19 pandemic, which necessitated consideration that the patient might be at risk for infection with the SARS-CoV-2 virus that causes COVID-19. Institutional protocols and algorithms that pertain to the evaluation of  patients at risk for COVID-19 are in a state of rapid change based on information released by regulatory bodies including the CDC and federal and state organizations. These policies and algorithms were followed during the patient's care in the ED.  ____________________________________________   FINAL CLINICAL IMPRESSION(S) / ED DIAGNOSES  Headache   Minna Antis, MD 11/07/20 2027

## 2020-11-20 DIAGNOSIS — R296 Repeated falls: Secondary | ICD-10-CM | POA: Insufficient documentation

## 2021-02-01 ENCOUNTER — Ambulatory Visit: Payer: Medicare Other | Admitting: Podiatry

## 2021-02-06 ENCOUNTER — Other Ambulatory Visit: Payer: Self-pay | Admitting: Internal Medicine

## 2021-02-06 DIAGNOSIS — Z1231 Encounter for screening mammogram for malignant neoplasm of breast: Secondary | ICD-10-CM

## 2021-04-22 IMAGING — CT CT HEAD W/O CM
3 series · 16 of 47 positions shown, 19 images · non-contrast
Comparison: 09/03/2019

CLINICAL DATA: Acute stroke. Confusion. Seizure-like activity.
Recent subarachnoid hemorrhage.

EXAM:
CT HEAD WITHOUT CONTRAST
TECHNIQUE: Contiguous axial images were obtained from the base of the skull
through the vertex without intravenous contrast.

[Series 2: head wo · axial · 0.42mm/px · z∈[+211,+336]mm · 10 of 30 slices shown, 13 images]
[im 3/30  brain]
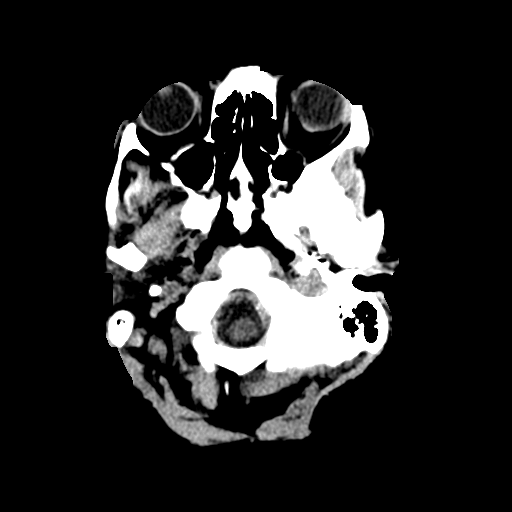
[im 3/30  bone]
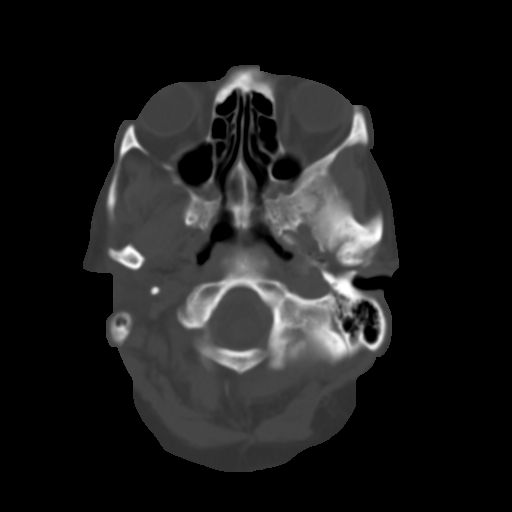
[im 6/30  brain]
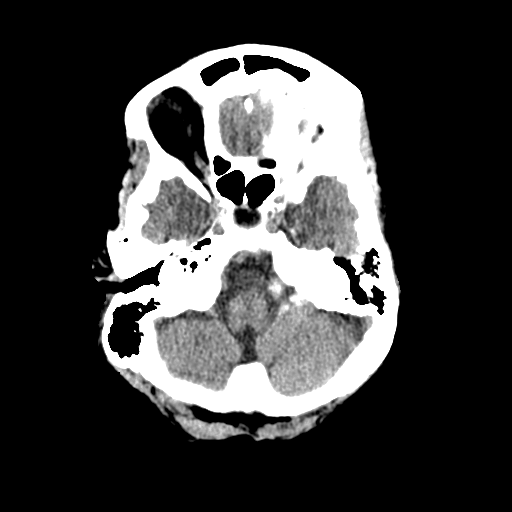
[im 9/30  brain]
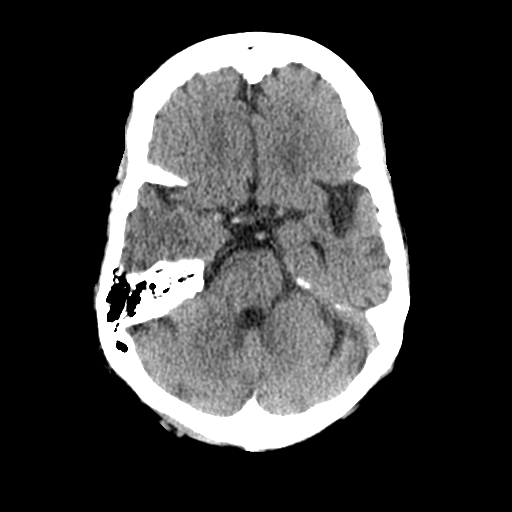
[im 11/30  brain]
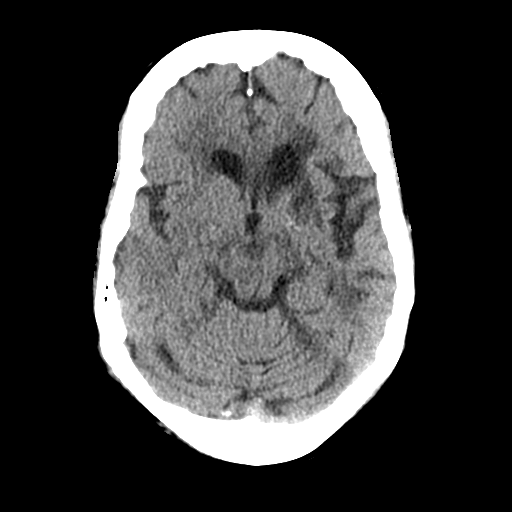
[im 14/30  brain]
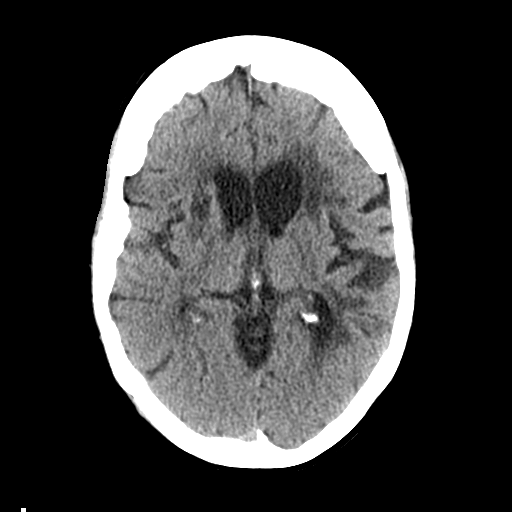
[im 14/30  bone]
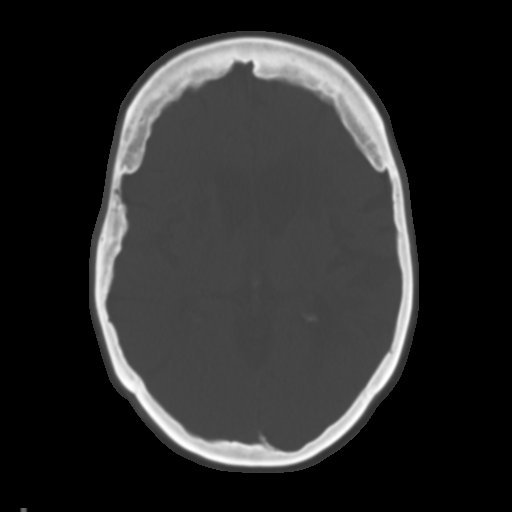
[im 17/30  brain]
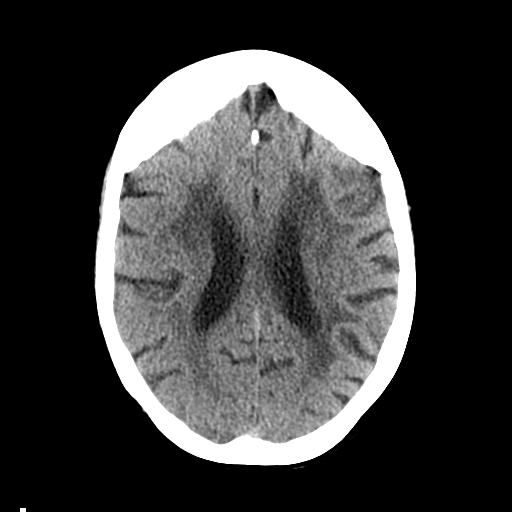
[im 20/30  brain]
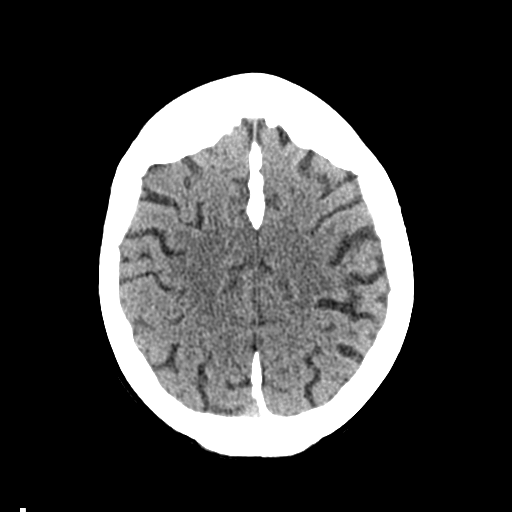
[im 23/30  brain]
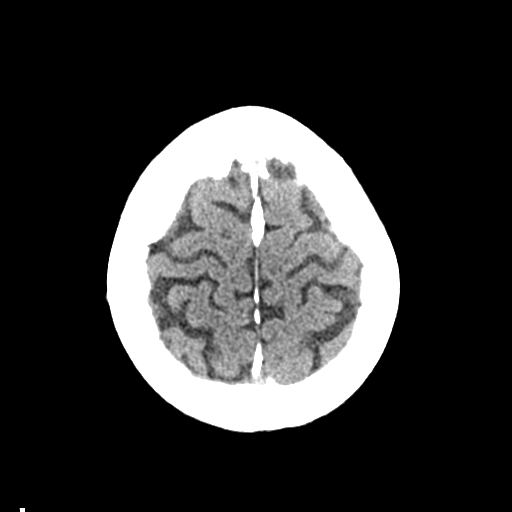
[im 25/30  brain]
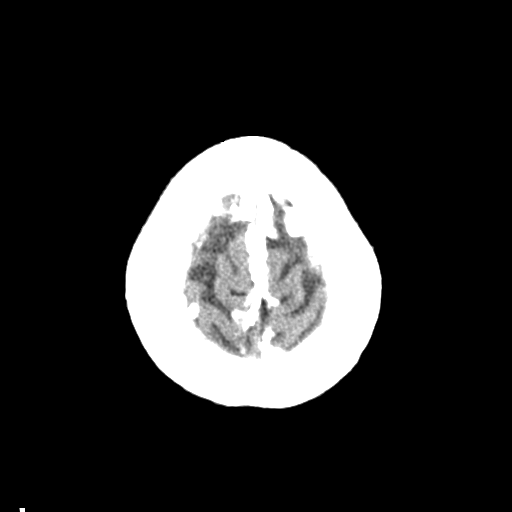
[im 25/30  bone]
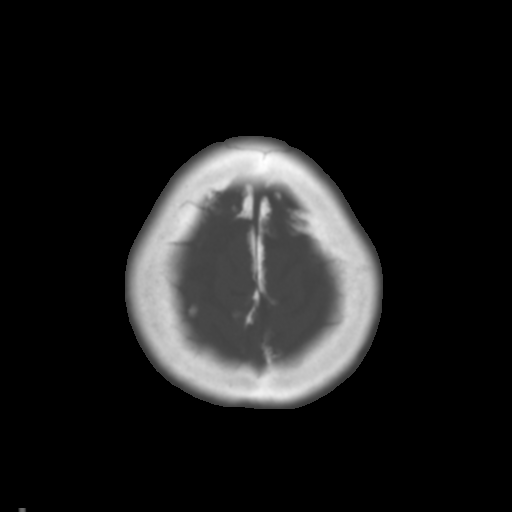
[im 28/30  brain]
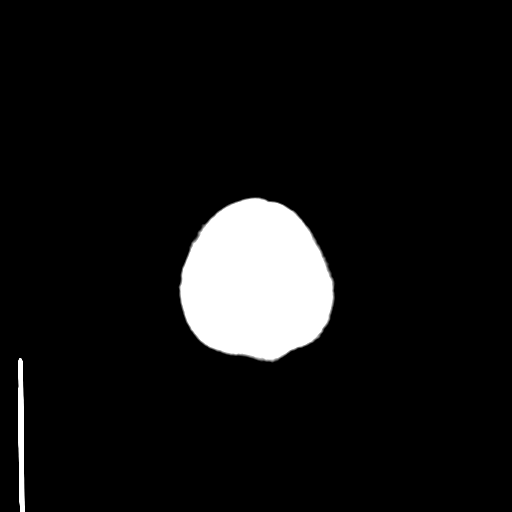

[Series 4: coronal soft tissue · coronal · 0.30mm/px · 3 of 62 slices shown]
[im 21/62  brain]
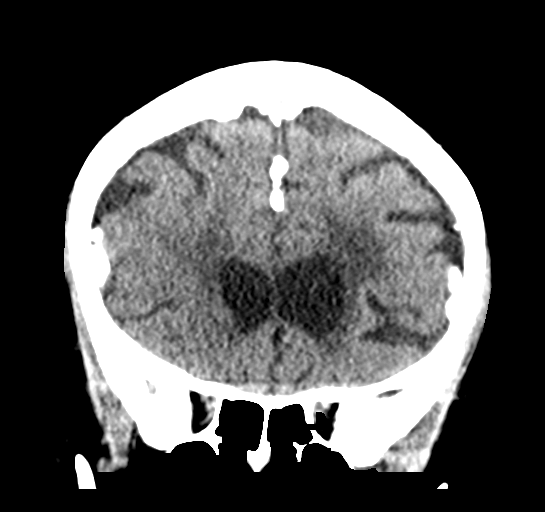
[im 28/62  brain]
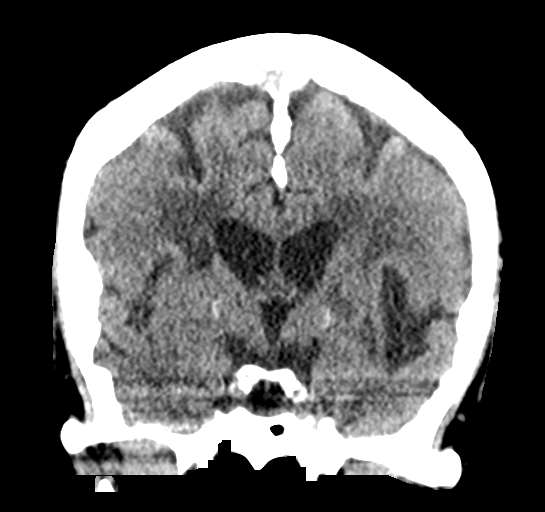
[im 34/62  brain]
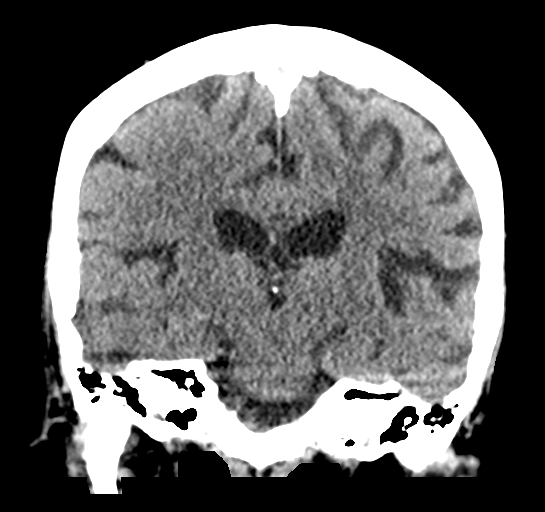

[Series 5: sagittal soft tissue · sagittal · 0.30mm/px · 3 of 46 slices shown]
[im 16/46  brain]
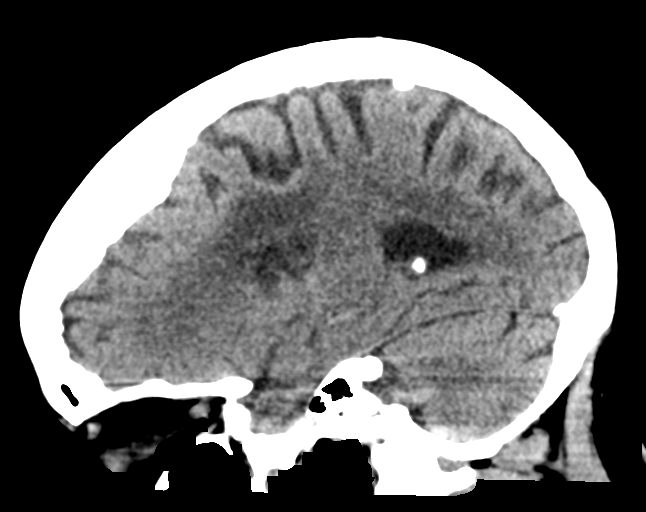
[im 23/46  brain]
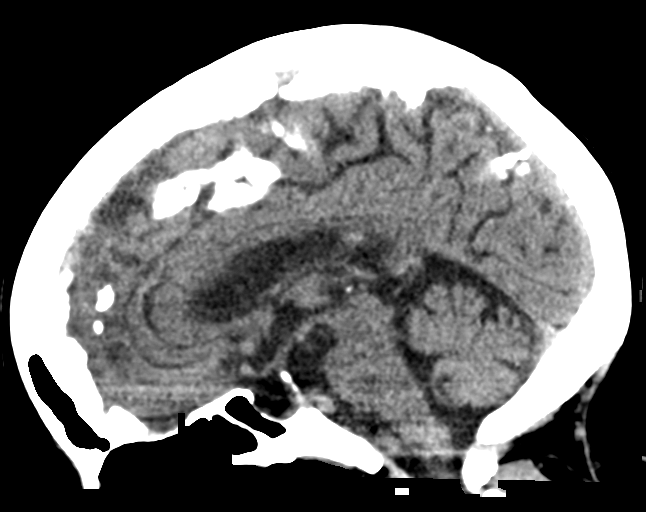
[im 31/46  brain]
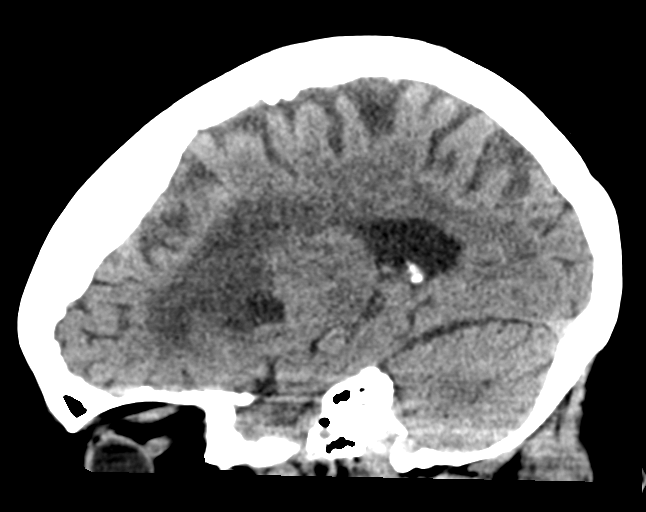

[16 of 47 positions shown; findings below may reference images not displayed]

FINDINGS: Brain: No evidence of acute infarction, hemorrhage, hydrocephalus,
extra-axial collection, or mass lesion/mass effect. Stable cerebral
atrophy and chronic small vessel disease. Old large left basal
ganglia lacunar infarcts are stable.

Vascular:  No hyperdense vessel or other acute findings.

Skull: No evidence of fracture or other significant bone
abnormality.

Sinuses/Orbits:  No acute findings.

Other: None.
IMPRESSION: No acute intracranial abnormality.

Stable cerebral atrophy, chronic small vessel disease, and old basal
ganglia lacunar infarcts.

## 2021-05-10 ENCOUNTER — Other Ambulatory Visit: Payer: Self-pay | Admitting: Internal Medicine

## 2021-05-10 DIAGNOSIS — Z1231 Encounter for screening mammogram for malignant neoplasm of breast: Secondary | ICD-10-CM

## 2021-09-26 ENCOUNTER — Ambulatory Visit: Payer: Medicare Other | Admitting: Physical Therapy

## 2021-10-04 ENCOUNTER — Encounter: Payer: Medicare Other | Admitting: Physical Therapy

## 2021-10-08 ENCOUNTER — Encounter: Payer: Medicare Other | Admitting: Physical Therapy

## 2021-10-11 ENCOUNTER — Ambulatory Visit: Payer: Medicare Other | Admitting: Podiatry

## 2021-10-11 ENCOUNTER — Encounter: Payer: Medicare Other | Admitting: Physical Therapy

## 2021-10-11 ENCOUNTER — Other Ambulatory Visit: Payer: Self-pay

## 2021-10-11 DIAGNOSIS — E1169 Type 2 diabetes mellitus with other specified complication: Secondary | ICD-10-CM | POA: Diagnosis not present

## 2021-10-11 DIAGNOSIS — M79675 Pain in left toe(s): Secondary | ICD-10-CM

## 2021-10-11 DIAGNOSIS — M79674 Pain in right toe(s): Secondary | ICD-10-CM

## 2021-10-11 DIAGNOSIS — B351 Tinea unguium: Secondary | ICD-10-CM | POA: Diagnosis not present

## 2021-10-11 DIAGNOSIS — E785 Hyperlipidemia, unspecified: Secondary | ICD-10-CM | POA: Diagnosis not present

## 2021-10-11 NOTE — Progress Notes (Signed)
°  Subjective:  Patient ID: Emily [name], female    DOB: 07/12/1946,  MRN: [id number]  Chief Complaint  Patient presents with   Nail Problem    Nail trim    76 y.o. female returns for the above complaint.  Patient presents with thickened elongated dystrophic toenails x10.  They are painful in nature.  She would like to have them debrided down.  Patient is a diabetic with last A1c of 5.5.  The sugars are well controlled per patient.  She does not have any secondary complaints.  Objective:  There were no vitals filed for this visit. Podiatric Exam: Vascular: dorsalis pedis and posterior tibial pulses are palpable bilateral. Capillary return is immediate. Temperature gradient is WNL. Skin turgor WNL  Sensorium: Normal Semmes Weinstein monofilament test. Normal tactile sensation bilaterally. Nail Exam: Pt has thick disfigured discolored nails with subungual debris noted bilateral entire nail hallux through fifth toenails Ulcer Exam: There is no evidence of ulcer or pre-ulcerative changes or infection. Orthopedic Exam: Muscle tone and strength are WNL. No limitations in general ROM. No crepitus or effusions noted. HAV  B/L.  Hammer toes 2-5  B/L. Skin: No Porokeratosis. No infection or ulcers.  Dry skin noted with a subjective component of itching noted to bilateral foot  Assessment & Plan:  Patient was evaluated and treated and all questions answered.  Xerosis bilateral foot -I explained to the patient the etiology of xerosis and various treatment options were extensively discussed.  I explained to the patient the importance of maintaining moisturization of the skin with application of over-the-counter lotion such as Eucerin or Luciderm.  I have asked the patient to apply this twice a day.  If unable to resolve patient will benefit from prescription lotion.   Onychomycosis with pain  -Nails palliatively debrided as below. -Educated on self-care  Procedure: Nail  Debridement Rationale: pain  Type of Debridement: manual, sharp debridement. Instrumentation: Nail nipper, rotary burr. Number of Nails: 10  Procedures and Treatment: Consent by patient was obtained for treatment procedures. The patient understood the discussion of treatment and procedures well. All questions were answered thoroughly reviewed. Debridement of mycotic and hypertrophic toenails, 1 through 5 bilateral and clearing of subungual debris. No ulceration, no infection noted.  Return Visit-Office Procedure: Patient instructed to return to the office for a follow up visit 3 months for continued evaluation and treatment.  Nicholes Rough, DPM    No follow-ups on file.

## 2021-10-15 ENCOUNTER — Encounter: Payer: Medicare Other | Admitting: Physical Therapy

## 2021-10-18 ENCOUNTER — Encounter: Payer: Medicare Other | Admitting: Physical Therapy

## 2021-10-23 ENCOUNTER — Encounter: Payer: Medicare Other | Admitting: Physical Therapy

## 2021-11-09 DIAGNOSIS — E039 Hypothyroidism, unspecified: Secondary | ICD-10-CM | POA: Insufficient documentation

## 2022-01-01 ENCOUNTER — Ambulatory Visit
Admission: RE | Admit: 2022-01-01 | Discharge: 2022-01-01 | Disposition: A | Discharge status: Home / Self Care | Payer: Medicare Other | Source: Ambulatory Visit | Attending: Internal Medicine | Admitting: Internal Medicine

## 2022-01-01 DIAGNOSIS — Z1231 Encounter for screening mammogram for malignant neoplasm of breast: Secondary | ICD-10-CM | POA: Insufficient documentation

## 2022-01-03 ENCOUNTER — Other Ambulatory Visit: Payer: Self-pay | Admitting: Internal Medicine

## 2022-01-03 DIAGNOSIS — N6489 Other specified disorders of breast: Secondary | ICD-10-CM

## 2022-01-03 DIAGNOSIS — R928 Other abnormal and inconclusive findings on diagnostic imaging of breast: Secondary | ICD-10-CM

## 2022-01-08 DIAGNOSIS — R410 Disorientation, unspecified: Secondary | ICD-10-CM | POA: Insufficient documentation

## 2022-01-10 ENCOUNTER — Ambulatory Visit: Payer: Medicare Other | Admitting: Podiatry

## 2022-01-15 ENCOUNTER — Ambulatory Visit
Admission: RE | Admit: 2022-01-15 | Discharge: 2022-01-15 | Disposition: A | Discharge status: Home / Self Care | Payer: Medicare Other | Source: Ambulatory Visit | Attending: Internal Medicine | Admitting: Internal Medicine

## 2022-01-15 DIAGNOSIS — N6489 Other specified disorders of breast: Secondary | ICD-10-CM | POA: Diagnosis present

## 2022-01-15 DIAGNOSIS — R928 Other abnormal and inconclusive findings on diagnostic imaging of breast: Secondary | ICD-10-CM | POA: Insufficient documentation

## 2022-02-07 ENCOUNTER — Ambulatory Visit: Payer: Medicare Other | Admitting: Podiatry

## 2022-02-24 ENCOUNTER — Other Ambulatory Visit: Payer: Self-pay

## 2022-02-24 ENCOUNTER — Emergency Department
Admission: EM | Admit: 2022-02-24 | Discharge: 2022-02-24 | Disposition: A | Discharge status: Home / Self Care | Payer: Medicare Other | Attending: Emergency Medicine | Admitting: Emergency Medicine

## 2022-02-24 DIAGNOSIS — R569 Unspecified convulsions: Secondary | ICD-10-CM

## 2022-02-24 DIAGNOSIS — G40909 Epilepsy, unspecified, not intractable, without status epilepticus: Secondary | ICD-10-CM | POA: Insufficient documentation

## 2022-02-24 DIAGNOSIS — R001 Bradycardia, unspecified: Secondary | ICD-10-CM | POA: Diagnosis not present

## 2022-02-24 DIAGNOSIS — E119 Type 2 diabetes mellitus without complications: Secondary | ICD-10-CM | POA: Diagnosis not present

## 2022-02-24 DIAGNOSIS — R8271 Bacteriuria: Secondary | ICD-10-CM | POA: Insufficient documentation

## 2022-02-24 DIAGNOSIS — I10 Essential (primary) hypertension: Secondary | ICD-10-CM | POA: Diagnosis not present

## 2022-02-24 LAB — CBC
HCT: 42.4 % (ref 36.0–46.0)
Hemoglobin: 13.5 g/dL (ref 12.0–15.0)
MCH: 28.7 pg (ref 26.0–34.0)
MCHC: 31.8 g/dL (ref 30.0–36.0)
MCV: 90.2 fL (ref 80.0–100.0)
Platelets: 148 K/uL — ABNORMAL LOW (ref 150–400)
RBC: 4.7 MIL/uL (ref 3.87–5.11)
RDW: 13 % (ref 11.5–15.5)
WBC: 5.3 K/uL (ref 4.0–10.5)
nRBC: 0 % (ref 0.0–0.2)

## 2022-02-24 LAB — URINALYSIS, COMPLETE (UACMP) WITH MICROSCOPIC
Bilirubin Urine: NEGATIVE
Glucose, UA: NEGATIVE mg/dL
Hgb urine dipstick: NEGATIVE
Ketones, ur: NEGATIVE mg/dL
Leukocytes,Ua: NEGATIVE
Nitrite: NEGATIVE
Protein, ur: NEGATIVE mg/dL
Specific Gravity, Urine: 1.004 — ABNORMAL LOW (ref 1.005–1.030)
pH: 7 (ref 5.0–8.0)

## 2022-02-24 LAB — COMPREHENSIVE METABOLIC PANEL
ALT: 10 U/L (ref 0–44)
AST: 23 U/L (ref 15–41)
Albumin: 3.4 g/dL — ABNORMAL LOW (ref 3.5–5.0)
Alkaline Phosphatase: 89 U/L (ref 38–126)
Anion gap: 6 (ref 5–15)
BUN: 17 mg/dL (ref 8–23)
CO2: 25 mmol/L (ref 22–32)
Calcium: 9.3 mg/dL (ref 8.9–10.3)
Chloride: 111 mmol/L (ref 98–111)
Creatinine, Ser: 1.01 mg/dL — ABNORMAL HIGH (ref 0.44–1.00)
GFR, Estimated: 59 mL/min — ABNORMAL LOW (ref >60)
Glucose, Bld: 139 mg/dL — ABNORMAL HIGH (ref 70–99)
Potassium: 3.9 mmol/L (ref 3.5–5.1)
Sodium: 142 mmol/L (ref 135–145)
Total Bilirubin: 0.6 mg/dL (ref 0.3–1.2)
Total Protein: 7.1 g/dL (ref 6.5–8.1)

## 2022-02-24 MED ORDER — SODIUM CHLORIDE 0.9 % IV BOLUS
1000.0000 mL | Freq: Once | INTRAVENOUS | Status: AC
Start: 2022-02-24 — End: 2022-02-24
  Administered 2022-02-24: 1000 mL via INTRAVENOUS

## 2022-02-24 MED ORDER — FOSFOMYCIN TROMETHAMINE 3 G PO PACK
3.0000 g | PACK | Freq: Once | ORAL | Status: AC
  Administered 2022-02-24: 3 g via ORAL
  Filled 2022-02-24: qty 3

## 2022-02-24 MED ORDER — LEVETIRACETAM IN NACL 1000 MG/100ML IV SOLN
1000.0000 mg | Freq: Once | INTRAVENOUS | Status: AC
  Administered 2022-02-24: 1000 mg via INTRAVENOUS
  Filled 2022-02-24: qty 100

## 2022-02-24 NOTE — Discharge Instructions (Addendum)
You have been seen in the emergency department for seizure.  Your work-up that showed reassuring results however did appear to have a slight urinary tract infection which has been treated with a one-time dose of fosfomycin in the emergency department.  Please follow-up with your primary care doctor by calling tomorrow to arrange an appointment within the next 2 days for recheck/reevaluation.  Return to the emergency department for any further seizure activity or any other symptom personally concerning to yourself.

## 2022-02-24 NOTE — ED Provider Notes (Signed)
[facility] Provider Note    Event Date/Time   First MD Initiated Contact with Patient 02/24/22 1758     (approximate)  History   Chief Complaint: Seizures  HPI  Emily [name] is a 76 y.o. female with a past medical history of hypertension, diabetes, seizure disorder, presents to the emergency department after a possible seizure.  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  Tonight it was lasting over an hour, family became concerned so they called EMS to bring her to the emergency department.  Here the patient appears well, she denies any complaints besides feeling hungry.  Patient noted to be somewhat bradycardic around 45 to 50 bpm.  Denies any headache.  Denies any weakness or numbness.  Denies any nausea vomiting diarrhea or fever.  Physical Exam   Triage Vital Signs: ED Triage Vitals  Enc Vitals Group     BP 02/24/22 1807 (!) 149/61     Pulse Rate 02/24/22 1807 (!) 44     Resp 02/24/22 1807 16     Temp 02/24/22 1807 97.8 F (36.6 C)     Temp Source 02/24/22 1807 Oral     SpO2 02/24/22 1807 99 %     Weight 02/24/22 1808 210 lb (95.3 kg)     Height --      Head Circumference --      Peak Flow --      Pain Score 02/24/22 1808 0     Pain Loc --      Pain Edu? --      Excl. in GC? --     Most recent vital signs: Vitals:   02/24/22 1807  BP: (!) 149/61  Pulse: (!) 44  Resp: 16  Temp: 97.8 F (36.6 C)  SpO2: 99%    General: Awake, no distress.  CV:  Good peripheral perfusion.  Regular rate and rhythm  Resp:  Normal effort.  Equal breath sounds bilaterally.  Abd:  No distention.  Soft, nontender.  No rebound or guarding.    ED Results / Procedures / Treatments   EKG  EKG viewed and interpreted by myself shows what appears to be a sinus bradycardia at 45 bpm with a narrow QRS, normal axis, largely normal  intervals with no concerning ST changes.   MEDICATIONS ORDERED IN ED: Medications - No data to display   IMPRESSION / MDM / ASSESSMENT AND PLAN / ED COURSE  I reviewed the triage vital signs and the nursing notes.  Patient's presentation is most consistent with acute presentation with potential threat to life or bodily function.  Patient presents to the emergency department from home after a possible seizure.  Patient has a history of absence seizure's family states typical episode tonight but usual postictal period lasting 10 to 15 minutes tonight had lasted over an hour so they called EMS to bring her to the emergency department.  Here the patient is awake alert she has no complaints.  Patient states she is hungry.  Per record view patient appears to be on Lamictal and Keppra.  We will load with 1000 mg of Keppra, check labs, urinalysis and continue to closely monitor.  Labs are reassuring.  Normal CBC, normal chemistry.  Urinalysis does show many bacteria but no white or red cells.  We will dose a one-time dose of fosfomycin and send a urine culture.  Patient is  awake alert she has no complaints.  Watching TV states she is ready go home.  We will discharge the patient home have her follow-up with her doctor tomorrow.  FINAL CLINICAL IMPRESSION(S) / ED DIAGNOSES   Seizure  Note:  This document was prepared using Dragon voice recognition software and may include unintentional dictation errors.   Minna Antis, MD 02/24/22 2210

## 2022-02-24 NOTE — ED Notes (Signed)
Pt able to walk to in room toilet and back to bed with 1 assist.  Tolerated well.  Bed was placed next to toilet.

## 2022-02-24 NOTE — ED Notes (Signed)
This RN attempted to call pts son Berna Spare at [phone] for pt discharge. No answer. Unable to leave voicemail.

## 2022-02-24 NOTE — ED Notes (Addendum)
This RN first encounter with pt prior to discharge. Pt verbalized understanding of discharge instructions and follow-up care instructions. Pt advised if symptoms worsen to return to ED. E-signature not available due to e-signature pad not being available.

## 2022-02-24 NOTE — ED Notes (Signed)
Pt helped up to bathroom

## 2022-02-24 NOTE — ED Notes (Signed)
Pt had soiled bed. Pt cleaned up and placed in clean brief

## 2022-02-24 NOTE — ED Triage Notes (Signed)
Pt BIB EMS for seizure like activity about 45 minutes ago. Pt has absence seizures and was postictal for about 30 minutes and family became concerned. Pt was bradycardia for EMS with HR in 40s. Pt a&ox4 now.

## 2022-02-27 LAB — URINE CULTURE: Culture: 30000 — AB

## 2022-06-25 ENCOUNTER — Ambulatory Visit: Payer: Medicare Other | Admitting: Podiatry

## 2022-07-30 ENCOUNTER — Ambulatory Visit: Payer: Medicare Other | Admitting: Podiatry

## 2022-08-08 ENCOUNTER — Ambulatory Visit: Payer: Medicare Other | Admitting: Podiatry

## 2022-08-08 DIAGNOSIS — M79675 Pain in left toe(s): Secondary | ICD-10-CM | POA: Diagnosis not present

## 2022-08-08 DIAGNOSIS — B351 Tinea unguium: Secondary | ICD-10-CM

## 2022-08-08 DIAGNOSIS — E785 Hyperlipidemia, unspecified: Secondary | ICD-10-CM

## 2022-08-08 DIAGNOSIS — E1169 Type 2 diabetes mellitus with other specified complication: Secondary | ICD-10-CM

## 2022-08-08 DIAGNOSIS — M79674 Pain in right toe(s): Secondary | ICD-10-CM

## 2022-08-14 NOTE — Progress Notes (Signed)
  Subjective:  Patient ID: Emily [name], female    DOB: 1946/03/27,  MRN: [id number]  Chief Complaint  Patient presents with   Nail Problem   76 y.o. female returns for the above complaint.  Patient presents with thickened elongated dystrophic toenails x10.  They are painful in nature.  She would like to have them debrided down.  Patient is a diabetic with last A1c of 5.5.  The sugars are well controlled per patient.  She does not have any secondary complaints.  Objective:  There were no vitals filed for this visit. Podiatric Exam: Vascular: dorsalis pedis and posterior tibial pulses are palpable bilateral. Capillary return is immediate. Temperature gradient is WNL. Skin turgor WNL  Sensorium: Normal Semmes Weinstein monofilament test. Normal tactile sensation bilaterally. Nail Exam: Pt has thick disfigured discolored nails with subungual debris noted bilateral entire nail hallux through fifth toenails Ulcer Exam: There is no evidence of ulcer or pre-ulcerative changes or infection. Orthopedic Exam: Muscle tone and strength are WNL. No limitations in general ROM. No crepitus or effusions noted. HAV  B/L.  Hammer toes 2-5  B/L. Skin: No Porokeratosis. No infection or ulcers.  Clinically improving dry skin  Assessment & Plan:  Patient was evaluated and treated and all questions answered.  Xerosis bilateral foot -Clinically improving   Onychomycosis with pain  -Nails palliatively debrided as below. -Educated on self-care  Procedure: Nail Debridement Rationale: pain  Type of Debridement: manual, sharp debridement. Instrumentation: Nail nipper, rotary burr. Number of Nails: 10  Procedures and Treatment: Consent by patient was obtained for treatment procedures. The patient understood the discussion of treatment and procedures well. All questions were answered thoroughly reviewed. Debridement of mycotic and hypertrophic toenails, 1 through 5 bilateral and clearing of subungual  debris. No ulceration, no infection noted.  Return Visit-Office Procedure: Patient instructed to return to the office for a follow up visit 3 months for continued evaluation and treatment.  Nicholes Rough, DPM    No follow-ups on file.

## 2022-11-14 ENCOUNTER — Ambulatory Visit: Payer: Medicare Other | Admitting: Podiatry

## 2023-02-11 ENCOUNTER — Other Ambulatory Visit: Payer: Self-pay | Admitting: Internal Medicine

## 2023-02-11 DIAGNOSIS — Z1231 Encounter for screening mammogram for malignant neoplasm of breast: Secondary | ICD-10-CM

## 2023-03-18 ENCOUNTER — Ambulatory Visit
Admission: RE | Admit: 2023-03-18 | Discharge: 2023-03-18 | Disposition: A | Discharge status: Home / Self Care | Payer: Medicare Other | Source: Ambulatory Visit | Attending: Internal Medicine | Admitting: Internal Medicine

## 2023-03-18 DIAGNOSIS — Z1231 Encounter for screening mammogram for malignant neoplasm of breast: Secondary | ICD-10-CM | POA: Insufficient documentation

## 2023-05-12 ENCOUNTER — Ambulatory Visit: Payer: Medicare Other | Admitting: Podiatry

## 2023-05-12 ENCOUNTER — Encounter: Payer: Self-pay | Admitting: Podiatry

## 2023-05-12 DIAGNOSIS — E785 Hyperlipidemia, unspecified: Secondary | ICD-10-CM

## 2023-05-12 DIAGNOSIS — M79675 Pain in left toe(s): Secondary | ICD-10-CM

## 2023-05-12 DIAGNOSIS — B351 Tinea unguium: Secondary | ICD-10-CM | POA: Diagnosis not present

## 2023-05-12 DIAGNOSIS — B353 Tinea pedis: Secondary | ICD-10-CM | POA: Diagnosis not present

## 2023-05-12 DIAGNOSIS — E1169 Type 2 diabetes mellitus with other specified complication: Secondary | ICD-10-CM

## 2023-05-12 DIAGNOSIS — M79674 Pain in right toe(s): Secondary | ICD-10-CM | POA: Diagnosis not present

## 2023-05-12 MED ORDER — KETOCONAZOLE 2 % EX CREA: 1.0000 | TOPICAL_CREAM | Freq: Every day | CUTANEOUS | 2 refills | Status: DC

## 2023-05-12 NOTE — Progress Notes (Signed)
  Subjective:  Patient ID: Emily [name], female    DOB: 09/06/1946,  MRN: [id number]  Chief Complaint  Patient presents with   Diabetes    "Toenails cut."    77 y.o. female presents with the above complaint. History confirmed with patient.  Nails are thick and elongated and causing pain.  Objective:  Physical Exam: warm, good capillary refill, no trophic changes or ulcerative lesions, normal DP and PT pulses, normal sensory exam, and tinea pedis. Left Foot: dystrophic yellowed discolored nail plates with subungual debris Right Foot: dystrophic yellowed discolored nail plates with subungual debris  Assessment:   1. Pain due to onychomycosis of toenails of both feet   2. Type 2 diabetes mellitus with hyperlipidemia (HCC)   3. Tinea pedis of both feet      Plan:  Patient was evaluated and treated and all questions answered.  Discussed the etiology and treatment options for the condition in detail with the patient. Educated patient on the topical and oral treatment options for mycotic nails. Recommended debridement of the nails today. Sharp and mechanical debridement performed of all painful and mycotic nails today. Nails debrided in length and thickness using a nail nipper to level of comfort. Discussed treatment options including appropriate shoe gear. Follow up as needed for painful nails.   Discussed the etiology and treatment options for tinea pedis.  Discussed topical and oral treatment.  Recommended topical treatment with 2% ketoconazole cream.  This was sent to the patient's pharmacy.  Also discussed appropriate foot hygiene, use of antifungal spray such as Tinactin in shoes, as well as cleaning her foot surfaces such as showers and bathroom floors with bleach.   Return in about 3 months (around 08/12/2023) for at risk diabetic foot care.

## 2023-08-14 ENCOUNTER — Ambulatory Visit: Payer: Medicare Other | Admitting: Podiatry

## 2023-09-04 ENCOUNTER — Encounter: Payer: Self-pay | Admitting: Podiatry

## 2023-09-04 ENCOUNTER — Ambulatory Visit: Payer: Medicare Other | Admitting: Podiatry

## 2023-09-04 DIAGNOSIS — E785 Hyperlipidemia, unspecified: Secondary | ICD-10-CM | POA: Diagnosis not present

## 2023-09-04 DIAGNOSIS — E1169 Type 2 diabetes mellitus with other specified complication: Secondary | ICD-10-CM | POA: Diagnosis not present

## 2023-09-04 DIAGNOSIS — M79675 Pain in left toe(s): Secondary | ICD-10-CM | POA: Diagnosis not present

## 2023-09-04 DIAGNOSIS — B351 Tinea unguium: Secondary | ICD-10-CM

## 2023-09-04 DIAGNOSIS — M79674 Pain in right toe(s): Secondary | ICD-10-CM

## 2023-09-04 DIAGNOSIS — B353 Tinea pedis: Secondary | ICD-10-CM

## 2023-09-04 DIAGNOSIS — E119 Type 2 diabetes mellitus without complications: Secondary | ICD-10-CM | POA: Diagnosis not present

## 2023-09-04 MED ORDER — KETOCONAZOLE 2 % EX CREA: TOPICAL_CREAM | CUTANEOUS | 1 refills | Status: AC

## 2023-09-04 NOTE — Progress Notes (Signed)
ANNUAL DIABETIC FOOT EXAM  Subjective: Emily [name] presents today for annual diabetic foot exam.  Patient states her feet still have scaling and are itching.  Patient confirms h/o diabetes.  Patient denies any h/o foot wounds.  Marguarite Arbour, MD is patient's PCP. LOV was 07/29/2023.  Past Medical History:  Diagnosis Date   Essential hypertension, benign    Gout, unspecified    Mixed hyperlipidemia    Stroke [facility])    march 2017   Syncope and collapse    Type II or unspecified type diabetes mellitus without mention of complication, uncontrolled    Patient Active Problem List   Diagnosis Date Noted   Transient confusion 01/08/2022   Acquired hypothyroidism 11/09/2021   Falls frequently 11/20/2020   Acute delirium    Bradycardia    Seizure (HCC)    Traumatic subarachnoid hemorrhage with loss of consciousness of 30 minutes or less (HCC) 08/05/2019   Foot swelling 05/25/2019   Bilateral swelling of feet 10/26/2018   Swelling of joint of right hand 06/04/2018   Swelling of joint, wrist, right 06/04/2018   Headache disorder 06/18/2017   TIA (transient ischemic attack) 06/09/2017   Gouty arthritis 06/03/2017   Chronic tension-type headache, intractable 10/29/2016   History of CVA (cerebrovascular accident) 07/30/2016   Aphasia as late effect of cerebrovascular accident 01/23/2016   Transient alteration of awareness 01/23/2016   Acute sinusitis 01/02/2016   History of fall 01/02/2016   Increased frequency of urination 01/02/2016   Malaise 01/02/2016   Pain in joint involving ankle and foot 01/02/2016   Other specified counseling 01/02/2016   Acute CVA (cerebrovascular accident) (HCC) 12/22/2015   Cerebral infarction (HCC) 12/22/2015   Syncope 12/21/2015   Hyperlipidemia 06/23/2013   Type 2 diabetes mellitus with hyperlipidemia (HCC) 06/23/2013   HTN (hypertension) 06/23/2013   Morbid obesity (HCC) 06/23/2013   Abnormal ECG 06/23/2013   Past Surgical  History:  Procedure Laterality Date   ABDOMINAL HYSTERECTOMY     TEE WITHOUT CARDIOVERSION N/A 12/26/2015   Procedure: TRANSESOPHAGEAL ECHOCARDIOGRAM (TEE);  Surgeon: Dalia Heading, MD;  Location: [facility] ORS;  Service: Cardiovascular;  Laterality: N/A;   TONSILLECTOMY AND ADENOIDECTOMY     Current Outpatient Medications on File Prior to Visit  Medication Sig Dispense Refill   acetaminophen (TYLENOL) 325 MG tablet Take 650 mg by mouth every 6 (six) hours as needed.     allopurinol (ZYLOPRIM) 100 MG tablet Take 200 mg by mouth daily.     atorvastatin (LIPITOR) 40 MG tablet Take 1 tablet (40 mg total) by mouth daily at 6 PM. 30 tablet 2   colchicine 0.6 MG tablet Take 1 tablet (0.6 mg total) by mouth 2 (two) times daily as needed. 20 tablet 0   furosemide (LASIX) 40 MG tablet Take 40 mg by mouth daily.     lamoTRIgine (LAMICTAL) 25 MG tablet Take by mouth.     levETIRAcetam (KEPPRA) 500 MG tablet Take 500 mg by mouth 2 (two) times daily.     losartan (COZAAR) 100 MG tablet Take 100 mg by mouth daily.     oxybutynin (DITROPAN) 5 MG tablet Take 5 mg by mouth 2 (two) times daily.     polyethylene glycol (MIRALAX / GLYCOLAX) packet Take 17 g by mouth daily. (Patient taking differently: Take 17 g by mouth daily as needed for moderate constipation.) 14 each 0   potassium chloride SA (K-DUR,KLOR-CON) 20 MEQ tablet Take 1 tablet (20 mEq total) by mouth daily. 30 tablet  0   No current facility-administered medications on file prior to visit.    Allergies  Allergen Reactions   Enalapril Other (See Comments) and Shortness Of Breath    Unknown chest tightness   Prednisone Palpitations   Social History   Occupational History   Not on file  Tobacco Use   Smoking status: Former   Smokeless tobacco: Never  Vaping Use   Vaping status: Never Used  Substance and Sexual Activity   Alcohol use: Yes    Comment: very seldom   Drug use: No   Sexual activity: Not on file   Family History  Problem  Relation Age of Onset   Heart disease Mother    Heart attack Mother    Hypertension Mother    Heart disease Father    Heart attack Father    Hypertension Father    Heart disease Brother    Stroke Brother    Heart attack Other     There is no immunization history on file for this patient.   Review of Systems: Negative except as noted in the HPI.   Objective: There were no vitals filed for this visit.  Emily [name] is a pleasant 77 y.o. female in NAD. AAO X 3.  Diabetic foot exam was performed with the following findings:   Normal sensation of 10g monofilament Intact posterior tibialis and dorsalis pedis pulses CFT <3 seconds b/l LE. Palpable pedal pulses b/l LE. No pain with calf compression b/l. Nonpitting edema noted b/l lower extremities left >right.  No open wounds b/l LE. No interdigital macerations noted b/l LE. Toenails 1-5 b/l elongated, discolored, dystrophic, thickened, crumbly with subungual debris and tenderness to dorsal palpation. No hyperkeratotic nor porokeratotic lesions present on today's visit. Diffuse scaling noted peripherally and plantarly b/l feet.  No interdigital macerations.  No blisters, no weeping. No signs of secondary bacterial infection noted.   Muscle strength 5/5 to all lower extremity muscle groups bilaterally. No pain, crepitus or joint limitation noted with ROM bilateral LE. No gross bony deformities bilaterally. Utilizes cane for ambulation assistance.  Still has pedal scaling noted b/l.       Lab Results  Component Value Date   HGBA1C 5.5 08/05/2019   ADA Risk Categorization: Low Risk :  Patient has all of the following: Intact protective sensation No prior foot ulcer  No severe deformity Pedal pulses present  Assessment: 1. Pain due to onychomycosis of toenails of both feet   2. Tinea pedis of both feet   3. Type 2 diabetes mellitus with hyperlipidemia (HCC)   4. Encounter for diabetic foot exam (HCC)     Meds ordered  this encounter  Medications   ketoconazole (NIZORAL) 2 % cream    Sig: Apply to both feet and between toes once daily for 6 weeks.    Dispense:  60 g    Refill:  1    -Patient was evaluated today. All questions/concerns addressed on today's visit. -Diabetic foot examination performed today. -Patient to continue soft, supportive shoe gear daily. -Toenails 1-5 b/l were debrided in length and girth with sterile nail nippers without iatrogenic bleeding.  -Discussed tinea pedis infection. To prevent re-infection of tinea pedis, patient/POA/caregiver instructed to spray shoes with Lysol every evening and clean tub/shower with bleach based cleanser. -Rx refilled for Ketoconazole Cream 2% to be applied once daily for six weeks. -Patient/POA to call should there be question/concern in the interim. Return in about 3 months (around 12/03/2023).  Freddie Breech,  DPM      [place] LOCATION: [address], [place] [address]                   Office [phone]   [facility] LOCATION: [address] [place], [place] [address] Office [phone]

## 2023-12-04 ENCOUNTER — Ambulatory Visit: Payer: Medicare Other | Admitting: Podiatry

## 2023-12-04 ENCOUNTER — Encounter: Payer: Self-pay | Admitting: Podiatry

## 2023-12-04 VITALS — Ht 62.0 in | Wt 210.0 lb

## 2023-12-04 DIAGNOSIS — E785 Hyperlipidemia, unspecified: Secondary | ICD-10-CM | POA: Diagnosis not present

## 2023-12-04 DIAGNOSIS — M79675 Pain in left toe(s): Secondary | ICD-10-CM

## 2023-12-04 DIAGNOSIS — B351 Tinea unguium: Secondary | ICD-10-CM | POA: Diagnosis not present

## 2023-12-04 DIAGNOSIS — E1169 Type 2 diabetes mellitus with other specified complication: Secondary | ICD-10-CM

## 2023-12-04 DIAGNOSIS — M79674 Pain in right toe(s): Secondary | ICD-10-CM

## 2023-12-10 ENCOUNTER — Encounter: Payer: Self-pay | Admitting: Podiatry

## 2023-12-10 NOTE — Progress Notes (Signed)
  Subjective:  Patient ID: Emily [name], female    DOB: Feb 27, 1946,  MRN: [id number]  78 y.o. female presents preventative diabetic foot care and painful, elongated thickened toenails x 10 which are symptomatic when wearing enclosed shoe gear. This interferes with his/her daily activities.  Chief Complaint  Patient presents with   Nail Problem    Pt is here for [facility] A1C was 6.4 PCP is Dr Judithann Sheen and LOV was in November.   New problem(s): None   PCP is Marguarite Arbour, MD.  Allergies  Allergen Reactions   Enalapril Other (See Comments) and Shortness Of Breath    Unknown chest tightness   Prednisone Palpitations    Review of Systems: Negative except as noted in the HPI.   Objective:  Emily [name] is a pleasant 78 y.o. female in NAD. AAO x 3.  Vascular Examination: Vascular status intact b/l with palpable pedal pulses. CFT immediate b/l. Pedal hair present. Nonpitting edema noted b/l lower extremities left >right. No pain with calf compression b/l. Skin temperature gradient WNL b/l. No varicosities noted. No cyanosis or clubbing noted.  Neurological Examination: Sensation grossly intact b/l with 10 gram monofilament. Vibratory sensation intact b/l.  Dermatological Examination: Pedal skin with normal turgor, texture and tone b/l. No open wounds nor interdigital macerations noted. Toenails 1-5 b/l thick, discolored, elongated with subungual debris and pain on dorsal palpation. No hyperkeratotic lesions noted b/l.   Musculoskeletal Examination: Muscle strength 5/5 to b/l LE.  No pain, crepitus noted b/l. No gross pedal deformities. Utilizes cane for ambulation assistance.   Radiographs: None  Last A1c:       No data to display           Assessment:   1. Pain due to onychomycosis of toenails of both feet   2. Type 2 diabetes mellitus with hyperlipidemia (HCC)    Plan:  Consent given for treatment. Patient examined. All patient's and/or POA's  questions/concerns addressed on today's visit. Mycotic toenails 1-5 debrided in length and girth without incident. Continue foot and shoe inspections daily. Monitor blood glucose per PCP/Endocrinologist's recommendations.Continue soft, supportive shoe gear daily. Report any pedal injuries to medical professional. Call office if there are any quesitons/concerns. -Patient/POA to call should there be question/concern in the interim.  Return in about 3 months (around 03/05/2024).  Freddie Breech, DPM       [place] LOCATION: [address] [place] [address]                   Office [phone]   [facility] LOCATION: [address] [place], [place] [address] Office [phone]

## 2023-12-26 ENCOUNTER — Other Ambulatory Visit: Payer: Self-pay | Admitting: Internal Medicine

## 2023-12-26 DIAGNOSIS — Z1231 Encounter for screening mammogram for malignant neoplasm of breast: Secondary | ICD-10-CM

## 2024-03-08 ENCOUNTER — Ambulatory Visit: Admitting: Podiatry

## 2024-03-08 ENCOUNTER — Encounter: Payer: Self-pay | Admitting: Podiatry

## 2024-03-08 VITALS — Ht 62.0 in | Wt 210.0 lb

## 2024-03-08 DIAGNOSIS — E785 Hyperlipidemia, unspecified: Secondary | ICD-10-CM

## 2024-03-08 DIAGNOSIS — M79675 Pain in left toe(s): Secondary | ICD-10-CM

## 2024-03-08 DIAGNOSIS — B351 Tinea unguium: Secondary | ICD-10-CM | POA: Diagnosis not present

## 2024-03-08 DIAGNOSIS — M79674 Pain in right toe(s): Secondary | ICD-10-CM

## 2024-03-08 DIAGNOSIS — E1169 Type 2 diabetes mellitus with other specified complication: Secondary | ICD-10-CM | POA: Diagnosis not present

## 2024-03-14 NOTE — Progress Notes (Signed)
  Subjective:  Patient ID: Emily [name], female    DOB: Apr 02, 1946,  MRN: [id number]  78 y.o. female presents preventative diabetic foot care and painful thick toenails that are difficult to trim. Pain interferes with ambulation. Aggravating factors include wearing enclosed shoe gear. Pain is relieved with periodic professional debridement.  Chief Complaint  Patient presents with   Nail Problem    Pt is here for [facility] PCP is Dr Auston and LOV was in April.    New problem(s): None   PCP is Auston Reyes BIRCH, MD. Allergies  Allergen Reactions   Enalapril Other (See Comments) and Shortness Of Breath    Unknown chest tightness   Prednisone  Palpitations    Review of Systems: Negative except as noted in the HPI.   Objective:  Emily [name] is a pleasant 78 y.o. female in NAD. AAO x 3.  Vascular Examination: Capillary refill time to digits immediate b/l. Palpable DP pulse(s) b/l LE. Palpable PT pulse(s) b/l LE. Nonpitting edema noted b/l lower extremities left >right.  No pain with calf compression b/l. Skin temperature gradient WNL b/l. No varicosities noted. No cyanosis or clubbing noted.  Neurological Examination: Sensation grossly intact b/l with 10 gram monofilament. Vibratory sensation intact b/l.  Dermatological Examination: Pedal skin with normal turgor, texture and tone b/l. No open wounds nor interdigital macerations noted. Toenails 1-5 b/l thick, discolored, elongated with subungual debris and pain on dorsal palpation. No hyperkeratotic lesions noted b/l.   Musculoskeletal Examination: Muscle strength 5/5 to b/l LE.  No pain, crepitus noted b/l. No gross pedal deformities. Patient ambulates independently without assistive aids.   Radiographs: None  Last A1c:       No data to display           Assessment:   1. Pain due to onychomycosis of toenails of both feet   2. Type 2 diabetes mellitus with hyperlipidemia (HCC)    Plan:  Patient was evaluated  and treated. All patient's and/or POA's questions/concerns addressed on today's visit. Toenails 1-5 debrided in length and girth without incident. Continue soft, supportive shoe gear daily. Report any pedal injuries to medical professional. Call office if there are any questions/concerns. -Patient/POA to call should there be question/concern in the interim.  Return in about 3 months (around 06/08/2024).  Delon LITTIE Merlin, DPM      [place] LOCATION: [address], [place] [address]                   Office [phone]   [facility] LOCATION: [address] [place], [place] [address] Office [phone]

## 2024-06-10 ENCOUNTER — Ambulatory Visit (INDEPENDENT_AMBULATORY_CARE_PROVIDER_SITE_OTHER): Admitting: Podiatry

## 2024-06-10 DIAGNOSIS — Z91199 Patient's noncompliance with other medical treatment and regimen due to unspecified reason: Secondary | ICD-10-CM

## 2024-06-10 NOTE — Progress Notes (Signed)
 1. No-show for appointment

## 2024-06-30 ENCOUNTER — Ambulatory Visit
Admission: RE | Admit: 2024-06-30 | Discharge: 2024-06-30 | Disposition: A | Discharge status: Home / Self Care | Source: Ambulatory Visit | Attending: Internal Medicine | Admitting: Internal Medicine

## 2024-06-30 DIAGNOSIS — Z1231 Encounter for screening mammogram for malignant neoplasm of breast: Secondary | ICD-10-CM | POA: Insufficient documentation

## 2024-07-07 ENCOUNTER — Other Ambulatory Visit: Payer: Self-pay | Admitting: Internal Medicine

## 2024-07-07 DIAGNOSIS — R1310 Dysphagia, unspecified: Secondary | ICD-10-CM

## 2024-07-07 DIAGNOSIS — Z0000 Encounter for general adult medical examination without abnormal findings: Secondary | ICD-10-CM

## 2024-07-13 ENCOUNTER — Ambulatory Visit
Admission: RE | Admit: 2024-07-13 | Discharge: 2024-07-13 | Disposition: A | Discharge status: Home / Self Care | Source: Ambulatory Visit | Attending: Internal Medicine | Admitting: Internal Medicine

## 2024-07-13 DIAGNOSIS — R1310 Dysphagia, unspecified: Secondary | ICD-10-CM | POA: Insufficient documentation

## 2024-07-13 DIAGNOSIS — Z0000 Encounter for general adult medical examination without abnormal findings: Secondary | ICD-10-CM | POA: Insufficient documentation

## 2024-07-26 ENCOUNTER — Ambulatory Visit: Admitting: Podiatry

## 2024-07-26 DIAGNOSIS — E785 Hyperlipidemia, unspecified: Secondary | ICD-10-CM | POA: Diagnosis not present

## 2024-07-26 DIAGNOSIS — E1169 Type 2 diabetes mellitus with other specified complication: Secondary | ICD-10-CM

## 2024-07-26 DIAGNOSIS — B351 Tinea unguium: Secondary | ICD-10-CM | POA: Diagnosis not present

## 2024-07-26 DIAGNOSIS — M79674 Pain in right toe(s): Secondary | ICD-10-CM | POA: Diagnosis not present

## 2024-07-26 DIAGNOSIS — M79675 Pain in left toe(s): Secondary | ICD-10-CM

## 2024-08-01 ENCOUNTER — Encounter: Payer: Self-pay | Admitting: Podiatry

## 2024-08-01 NOTE — Progress Notes (Signed)
  Subjective:  Patient ID: Emily [name], female    DOB: [birth year],  MRN: [id number]  Emily [name] presents to clinic today for preventative diabetic foot care for painful mycotic toenails of both feet that are difficult to trim. Pain interferes with daily activities and wearing enclosed shoe gear comfortably.  Chief Complaint  Patient presents with   Toe Pain    A1c 5.5 Dr. Auston is her PCP.Last visit was in Oct.    New problem(s): None.   PCP is Auston Reyes BIRCH, MD.  Allergies  Allergen Reactions   Enalapril Other (See Comments) and Shortness Of Breath    Unknown chest tightness   Prednisone  Palpitations    Review of Systems: Negative except as noted in the HPI.  Objective: No changes noted in today's physical examination. There were no vitals filed for this visit. Emily [name] is a pleasant 78 y.o. female in NAD. AAO x 3.  Vascular Examination: Capillary refill time to digits immediate b/l. Palpable DP pulse(s) b/l LE. Palpable PT pulse(s) b/l LE. Nonpitting edema noted b/l lower extremities left >right.  No pain with calf compression b/l. Skin temperature gradient WNL b/l. No varicosities noted. No cyanosis or clubbing noted.  Neurological Examination: Sensation grossly intact b/l with 10 gram monofilament. Vibratory sensation intact b/l.  Dermatological Examination: Pedal skin with normal turgor, texture and tone b/l. No open wounds nor interdigital macerations noted. Toenails 1-5 b/l thick, discolored, elongated with subungual debris and pain on dorsal palpation. No hyperkeratotic lesions noted b/l.   Musculoskeletal Examination: Muscle strength 5/5 to b/l LE.  No pain, crepitus noted b/l. No gross pedal deformities. Patient ambulates independently without assistive aids.   Radiographs: None  Assessment/Plan: 1. Pain due to onychomycosis of toenails of both feet   2. Type 2 diabetes mellitus with hyperlipidemia [facility])   Consent given for  treatment. Patient examined. All patient's and/or POA's questions/concerns addressed on today's visit. Mycotic toenails 1-5 b/l debrided in length and girth without incident. Continue foot and shoe inspections daily. Monitor blood glucose per PCP/Endocrinologist's recommendations.Continue soft, supportive shoe gear daily. Report any pedal injuries to medical professional. Call office if there are any quesitons/concerns. -Patient/POA to call should there be question/concern in the interim.   Return in about 3 months (around 10/26/2024).  Emily [name], DPM      [place] LOCATION: [address], [place] [address]                   Office [phone]   [facility] LOCATION: [address] [place], [place] [address] Office [phone]

## 2024-08-30 ENCOUNTER — Encounter: Payer: Self-pay | Admitting: Emergency Medicine

## 2024-08-30 ENCOUNTER — Emergency Department: Admission: EM | Admit: 2024-08-30 | Discharge: 2024-08-30 | Disposition: A | Discharge status: Home / Self Care

## 2024-08-30 ENCOUNTER — Other Ambulatory Visit: Payer: Self-pay

## 2024-08-30 ENCOUNTER — Emergency Department

## 2024-08-30 ENCOUNTER — Ambulatory Visit: Admission: RE | Admit: 2024-08-30

## 2024-08-30 DIAGNOSIS — W19XXXA Unspecified fall, initial encounter: Secondary | ICD-10-CM

## 2024-08-30 DIAGNOSIS — S0990XA Unspecified injury of head, initial encounter: Secondary | ICD-10-CM

## 2024-08-30 SURGERY — EGD (ESOPHAGOGASTRODUODENOSCOPY)
Anesthesia: General

## 2024-08-30 NOTE — ED Triage Notes (Signed)
 Presents via EMS from home s/p fall  States she hit her head on dresser

## 2024-08-30 NOTE — ED Provider Notes (Signed)
 [facility] Provider Note    Event Date/Time   First MD Initiated Contact with Patient 08/30/24 0831     (approximate)   History   Fall   HPI  Emily [name] is a 78 y.o. female who presents today for evaluation after a fall.  She reports that she tripped on her walker and she fell and hit her head on the dresser.  There was no LOC.  She required assistance getting which is typical for her.  She denies headache now.  No neck pain or stiffness.  No other injury sustained.  She has been able to ambulate since the event.  She is on a daily baby aspirin .  Patient Active Problem List   Diagnosis Date Noted   Transient confusion 01/08/2022   Acquired hypothyroidism 11/09/2021   Falls frequently 11/20/2020   Acute delirium    Bradycardia    Seizure (HCC)    Traumatic subarachnoid hemorrhage with loss of consciousness of 30 minutes or less (HCC) 08/05/2019   Foot swelling 05/25/2019   Bilateral swelling of feet 10/26/2018   Swelling of joint of right hand 06/04/2018   Swelling of joint, wrist, right 06/04/2018   Headache disorder 06/18/2017   TIA (transient ischemic attack) 06/09/2017   Gouty arthritis 06/03/2017   Chronic tension-type headache, intractable 10/29/2016   History of CVA (cerebrovascular accident) 07/30/2016   Aphasia as late effect of cerebrovascular accident 01/23/2016   Transient alteration of awareness 01/23/2016   Acute sinusitis 01/02/2016   History of fall 01/02/2016   Increased frequency of urination 01/02/2016   Malaise 01/02/2016   Pain in joint involving ankle and foot 01/02/2016   Other specified counseling 01/02/2016   Acute CVA (cerebrovascular accident) (HCC) 12/22/2015   Cerebral infarction (HCC) 12/22/2015   Syncope 12/21/2015   Hyperlipidemia 06/23/2013   Type 2 diabetes mellitus with hyperlipidemia (HCC) 06/23/2013   HTN (hypertension) 06/23/2013   Morbid obesity (HCC) 06/23/2013   Abnormal ECG 06/23/2013           Physical Exam   Triage Vital Signs: ED Triage Vitals  Encounter Vitals Group     BP      Girls Systolic BP Percentile      Girls Diastolic BP Percentile      Boys Systolic BP Percentile      Boys Diastolic BP Percentile      Pulse      Resp      Temp      Temp src      SpO2      Weight      Height      Head Circumference      Peak Flow      Pain Score      Pain Loc      Pain Education      Exclude from Growth Chart     Most recent vital signs: Vitals:   08/30/24 0833  BP: (!) 177/77  Pulse: (!) 58  Resp: 18  Temp: 98.7 F (37.1 C)  SpO2: 100%    Physical Exam Vitals and nursing note reviewed.  Constitutional:      General: Awake and alert. No acute distress.    Appearance: Normal appearance. The patient is normal weight.  HENT:     Head: Normocephalic and atraumatic.  No laceration, hematoma, or evidence of trauma to head or neck.    Mouth: Mucous membranes are moist.  Eyes:     General: PERRL. Normal EOMs  Right eye: No discharge.        Left eye: No discharge.     Conjunctiva/sclera: Conjunctivae normal.  Cardiovascular:     Rate and Rhythm: Normal rate and regular rhythm.     Pulses: Normal pulses.  Pulmonary:     Effort: Pulmonary effort is normal. No respiratory distress.     Breath sounds: Normal breath sounds.  No chest wall tenderness or ecchymosis Abdominal:     Abdomen is soft. There is no abdominal tenderness. No rebound or guarding. No distention. Musculoskeletal:        General: No swelling. Normal range of motion.     Cervical back: Normal range of motion and neck supple. No midline cervical spine tenderness.  Full range of motion of neck.  Negative Spurling test.  Normal strength and sensation in bilateral upper extremities. Normal grip strength bilaterally.  Normal intrinsic muscle function of the hand bilaterally.  Normal radial pulses bilaterally. No lumbar or thoracic vertebral tenderness Pelvis stable.  Normal active  and passive range of motion of bilateral hips, normal gait Skin:    General: Skin is warm and dry.     Capillary Refill: Capillary refill takes less than 2 seconds.     Findings: No rash.  Neurological:     Mental Status: The patient is awake and alert.   Neurological: GCS 15 alert and oriented, at mental baseline Normal speech, no expressive or receptive aphasia or dysarthria Cranial nerves II through XII intact Normal visual fields 5 out of 5 strength in all 4 extremities with intact sensation throughout No extremity drift Normal finger-to-nose testing, no limb or truncal ataxia    ED Results / Procedures / Treatments   Labs (all labs ordered are listed, but only abnormal results are displayed) Labs Reviewed - No data to display   EKG     RADIOLOGY I independently reviewed and interpreted imaging and agree with radiologists findings.     PROCEDURES:  Critical Care performed:   Procedures   MEDICATIONS ORDERED IN ED: Medications - No data to display   IMPRESSION / MDM / ASSESSMENT AND PLAN / ED COURSE  I reviewed the triage vital signs and the nursing notes.   Differential diagnosis includes, but is not limited to, contusion, concussion, intracranial hemorrhage, cervical spine injury.  Patient is awake and alert, hemodynamically stable and afebrile.  She is at her mental baseline and answering all questions appropriately.  She has normal strength and sensation in bilateral upper and lower extremities, normal grip strength bilaterally, do not suspect central cord syndrome..  Face is symmetric.  There is no evidence of trauma to her head.  She denies any preceding symptoms, has been feeling well recently, and fell merely because she tripped on her walker.  Her headache has resolved entirely.  Has full active and passive range of motion of bilateral hips, as well as all joints.  I do not suspect fracture or dislocation.  She is able to ambulate without  difficulty.  Will obtain CT head and neck given age over 82 and the fact that she is on aspirin .  CT head and neck was negative for any acute findings.  Patient was offered analgesia but declined, reporting that her pain is resolved completely.  We discussed Intermatic management and return precautions.  Patient understands and agrees with plan.  Discharged in stable condition.   Patient's presentation is most consistent with acute complicated illness / injury requiring diagnostic workup.     FINAL CLINICAL  IMPRESSION(S) / ED DIAGNOSES   Final diagnoses:  Fall, initial encounter  Injury of head, initial encounter     Rx / DC Orders   ED Discharge Orders     None        Note:  This document was prepared using Dragon voice recognition software and may include unintentional dictation errors.   Osa Fogarty E, PA-C 08/30/24 1504    Clarine Ozell LABOR, MD 08/31/24 [id number]

## 2024-08-30 NOTE — Discharge Instructions (Signed)
 Your CT scans were normal today.  Please follow-up with your outpatient provider.  Please return for any new, worsening, or changing symptoms or other concerns.  It was a pleasure caring for you today.

## 2024-10-15 ENCOUNTER — Ambulatory Visit
Admission: RE | Admit: 2024-10-15 | Discharge: 2024-10-15 | Disposition: A | Discharge status: Home / Self Care | Attending: Gastroenterology | Admitting: Gastroenterology

## 2024-10-15 ENCOUNTER — Other Ambulatory Visit: Payer: Self-pay

## 2024-10-15 ENCOUNTER — Ambulatory Visit

## 2024-10-15 ENCOUNTER — Encounter
Admission: RE | Disposition: A | Discharge status: Home / Self Care | Payer: Self-pay | Source: Home / Self Care | Attending: Gastroenterology

## 2024-10-15 DIAGNOSIS — Z7982 Long term (current) use of aspirin: Secondary | ICD-10-CM | POA: Insufficient documentation

## 2024-10-15 DIAGNOSIS — I10 Essential (primary) hypertension: Secondary | ICD-10-CM | POA: Diagnosis not present

## 2024-10-15 DIAGNOSIS — R1310 Dysphagia, unspecified: Secondary | ICD-10-CM | POA: Insufficient documentation

## 2024-10-15 DIAGNOSIS — E119 Type 2 diabetes mellitus without complications: Secondary | ICD-10-CM | POA: Insufficient documentation

## 2024-10-15 DIAGNOSIS — K222 Esophageal obstruction: Secondary | ICD-10-CM | POA: Diagnosis not present

## 2024-10-15 DIAGNOSIS — R569 Unspecified convulsions: Secondary | ICD-10-CM | POA: Insufficient documentation

## 2024-10-15 DIAGNOSIS — E039 Hypothyroidism, unspecified: Secondary | ICD-10-CM | POA: Insufficient documentation

## 2024-10-15 DIAGNOSIS — Z8673 Personal history of transient ischemic attack (TIA), and cerebral infarction without residual deficits: Secondary | ICD-10-CM | POA: Diagnosis not present

## 2024-10-15 DIAGNOSIS — K449 Diaphragmatic hernia without obstruction or gangrene: Secondary | ICD-10-CM | POA: Insufficient documentation

## 2024-10-15 LAB — GLUCOSE, CAPILLARY: Glucose-Capillary: 115 mg/dL — ABNORMAL HIGH (ref 70–99)

## 2024-10-15 MED ORDER — PROPOFOL 500 MG/50ML IV EMUL
INTRAVENOUS | Status: DC | PRN
  Administered 2024-10-15: 145 ug/kg/min via INTRAVENOUS

## 2024-10-15 MED ORDER — ESMOLOL HCL 100 MG/10ML IV SOLN
INTRAVENOUS | Status: DC | PRN
  Administered 2024-10-15: 40 mg via INTRAVENOUS

## 2024-10-15 MED ORDER — LIDOCAINE HCL (CARDIAC) PF 100 MG/5ML IV SOSY
PREFILLED_SYRINGE | INTRAVENOUS | Status: DC | PRN
  Administered 2024-10-15: 100 mg via INTRAVENOUS

## 2024-10-15 MED ORDER — SODIUM CHLORIDE 0.9 % IV SOLN: INTRAVENOUS | Status: DC

## 2024-10-15 MED ORDER — PROPOFOL 10 MG/ML IV BOLUS
INTRAVENOUS | Status: DC | PRN
  Administered 2024-10-15: 50 mg via INTRAVENOUS

## 2024-10-15 NOTE — Op Note (Signed)
 [facility] Patient Name: Emily [name] Procedure Date: 10/15/2024 10:50 AM MRN: [id number] Account #: [id number] Date of Birth: 02/29/1946 Admit Type: Outpatient Age: 79 Room: [facility] ENDO ROOM 3 Gender: Female Note Status: Finalized Instrument Name: Endoscope [id number] Procedure:             Upper GI endoscopy Indications:           Dysphagia, Abnormal cine-esophagram, Hiatal hernia Providers:             Ole Schick MD, MD Referring MD:          Emily BIRCH. Auston, MD (Referring MD) Medicines:             Monitored Anesthesia Care Complications:         No immediate complications. Estimated blood loss:                         Minimal. Procedure:             Pre-Anesthesia Assessment:                        - Prior to the procedure, a History and Physical was                         performed, and patient medications and allergies were                         reviewed. The patient is competent. The risks and                         benefits of the procedure and the sedation options and                         risks were discussed with the patient. All questions                         were answered and informed consent was obtained.                         Patient identification and proposed procedure were                         verified by the physician, the nurse, the                         anesthesiologist, the anesthetist and the technician                         in the endoscopy suite. Mental Status Examination:                         alert and oriented. Airway Examination: normal                         oropharyngeal airway and neck mobility. Respiratory                         Examination: clear to auscultation. CV Examination:  normal. Prophylactic Antibiotics: The patient does not                         require prophylactic antibiotics. Prior                         Anticoagulants: The patient has taken  no anticoagulant                         or antiplatelet agents. ASA Grade Assessment: III - A                         patient with severe systemic disease. After reviewing                         the risks and benefits, the patient was deemed in                         satisfactory condition to undergo the procedure. The                         anesthesia plan was to use monitored anesthesia care                         (MAC). Immediately prior to administration of                         medications, the patient was re-assessed for adequacy                         to receive sedatives. The heart rate, respiratory                         rate, oxygen saturations, blood pressure, adequacy of                         pulmonary ventilation, and response to care were                         monitored throughout the procedure. The physical                         status of the patient was re-assessed after the                         procedure.                        After obtaining informed consent, the endoscope was                         passed under direct vision. Throughout the procedure,                         the patient's blood pressure, pulse, and oxygen                         saturations were monitored continuously. The Endoscope  was introduced through the mouth, and advanced to the                         second part of duodenum. The upper GI endoscopy was                         accomplished without difficulty. The patient tolerated                         the procedure well. Findings:      A non-obstructing Schatzki ring was found in the lower third of the       esophagus. A TTS dilator was passed through the scope. Dilation with a       15-16.5-18 mm balloon dilator was performed to 18 mm. The dilation site       was examined and showed moderate mucosal disruption. Estimated blood       loss was minimal.      A medium-sized hiatal hernia was present.       The entire examined stomach was normal.      The examined duodenum was normal. Impression:            - Non-obstructing Schatzki ring. Dilated.                        - Medium-sized hiatal hernia.                        - Normal stomach.                        - Normal examined duodenum.                        - No specimens collected. Recommendation:        - Discharge patient to home.                        - Resume previous diet.                        - Continue present medications.                        - Return to referring physician as previously                         scheduled. Procedure Code(s):     --- Professional ---                        [phone], Esophagogastroduodenoscopy, flexible,                         transoral; with transendoscopic balloon dilation of                         esophagus (less than 30 mm diameter) Diagnosis Code(s):     --- Professional ---                        K22.2, Esophageal obstruction  K44.9, Diaphragmatic hernia without obstruction or                         gangrene                        R13.10, Dysphagia, unspecified                        R93.3, Abnormal findings on diagnostic imaging of                         other parts of digestive tract CPT copyright 2022 [employer]. All rights reserved. The codes documented in this report are preliminary and upon coder review may  be revised to meet current compliance requirements. Ole Schick MD, MD 10/15/2024 11:07:25 AM Number of Addenda: 0 Note Initiated On: 10/15/2024 10:50 AM Estimated Blood Loss:  Estimated blood loss was minimal.      [facility]

## 2024-10-15 NOTE — Anesthesia Procedure Notes (Signed)
 Procedure Name: General with mask airway Date/Time: 10/15/2024 10:55 AM  Performed by: Ledora Duncan, CRNAPre-anesthesia Checklist: Patient identified, Emergency Drugs available, Suction available and Patient being monitored Patient Re-evaluated:Patient Re-evaluated prior to induction Oxygen Delivery Method: Simple face mask Induction Type: IV induction Placement Confirmation: positive ETCO2 and breath sounds checked- equal and bilateral Dental Injury: Teeth and Oropharynx as per pre-operative assessment

## 2024-10-15 NOTE — Anesthesia Preprocedure Evaluation (Addendum)
"                                    Anesthesia Evaluation    Airway Mallampati: II  TM Distance: >3 FB     Dental   Pulmonary former smoker   breath sounds clear to auscultation       Cardiovascular hypertension,  Rhythm:Regular     Neuro/Psych Seizures -, Well Controlled,  CVA 4 years ago. 1 seizure at that time. No seizures since then. TIACVA    GI/Hepatic   Endo/Other  diabetesHypothyroidism    Renal/GU      Musculoskeletal  (+) Arthritis ,    Abdominal   Peds  Hematology   Anesthesia Other Findings   Reproductive/Obstetrics                              Anesthesia Physical Anesthesia Plan  ASA: 3  Anesthesia Plan:    Post-op Pain Management:    Induction:   PONV Risk Score and Plan: TIVA and Propofol infusion  Airway Management Planned: Natural Airway  Additional Equipment:   Intra-op Plan:   Post-operative Plan:   Informed Consent: I have reviewed the patients History and Physical, chart, labs and discussed the procedure including the risks, benefits and alternatives for the proposed anesthesia with the patient or authorized representative who has indicated his/her understanding and acceptance.       Plan Discussed with: CRNA  Anesthesia Plan Comments:          Anesthesia Quick Evaluation  "

## 2024-10-15 NOTE — H&P (Signed)
 Outpatient short stay form Pre-procedure 10/15/2024  Ole ONEIDA Schick, MD  Primary Physician: Auston Reyes BIRCH, MD  Reason for visit:  Abnormal imaging/Dysphagia  History of present illness:    79 y/o lady with history of CVA, hypothyroidism, and DM II here for EGD for barium swallow showing possible stricture and dysphagia to solids and liquids. No blood thinners besides aspirin . No neck surgeries.   Current Medications[1]  Medications Prior to Admission  Medication Sig Dispense Refill Last Dose/Taking   allopurinol (ZYLOPRIM) 100 MG tablet Take 200 mg by mouth daily.   10/14/2024   aspirin  EC 81 MG tablet Take 81 mg by mouth daily. Swallow whole.   10/14/2024   atorvastatin  (LIPITOR) 40 MG tablet Take 1 tablet (40 mg total) by mouth daily at 6 PM. 30 tablet 2 10/14/2024   donepezil (ARICEPT ODT) 5 MG disintegrating tablet Take 5 mg by mouth at bedtime.   10/14/2024   lamoTRIgine (LAMICTAL) 25 MG tablet Take by mouth.   10/14/2024   levETIRAcetam  (KEPPRA ) 500 MG tablet Take 500 mg by mouth 2 (two) times daily.   10/14/2024   levothyroxine (SYNTHROID) 50 MCG tablet Take 50 mcg by mouth daily before breakfast.   10/14/2024   losartan (COZAAR) 100 MG tablet Take 100 mg by mouth daily.   10/15/2024 at  7:35 AM   oxybutynin  (DITROPAN ) 5 MG tablet Take 5 mg by mouth 2 (two) times daily.   10/14/2024   potassium chloride  SA (K-DUR,KLOR-CON ) 20 MEQ tablet Take 1 tablet (20 mEq total) by mouth daily. 30 tablet 0 10/14/2024   acetaminophen  (TYLENOL ) 325 MG tablet Take 650 mg by mouth every 6 (six) hours as needed.      colchicine  0.6 MG tablet Take 1 tablet (0.6 mg total) by mouth 2 (two) times daily as needed. 20 tablet 0    furosemide (LASIX) 40 MG tablet Take 40 mg by mouth daily.      ketoconazole  (NIZORAL ) 2 % cream Apply to both feet and between toes once daily for 6 weeks. 60 g 1    polyethylene glycol (MIRALAX  / GLYCOLAX ) packet Take 17 g by mouth daily. (Patient taking differently: Take 17 g  by mouth daily as needed for moderate constipation.) 14 each 0      Allergies[2]   Past Medical History:  Diagnosis Date   Essential hypertension, benign    Gout, unspecified    Mixed hyperlipidemia    Stroke [facility])    march 2017   Syncope and collapse    Type II or unspecified type diabetes mellitus without mention of complication, uncontrolled     Review of systems:  Otherwise negative.    Physical Exam  Gen: Alert, oriented. Appears stated age.  HEENT: PERRLA. Lungs: No respiratory distress CV: RRR Abd: soft, benign, no masses Ext: No edema    Planned procedures: Proceed with EGD The patient understands the nature of the planned procedure, indications, risks, alternatives and potential complications including but not limited to bleeding, infection, perforation, damage to internal organs and possible oversedation/side effects from anesthesia. The patient agrees and gives consent to proceed.  Please refer to procedure notes for findings, recommendations and patient disposition/instructions.     Ole ONEIDA Schick, MD [facility]         [1]  Current Facility-Administered Medications:    0.9 %  sodium chloride  infusion, , Intravenous, Continuous, Abir Craine, Ole ONEIDA, MD, Last Rate: 20 mL/hr at 10/15/24 1028, New Bag at 10/15/24 1028 [2]  Allergies Allergen  Reactions   Enalapril Other (See Comments) and Shortness Of Breath    Unknown chest tightness   Prednisone  Palpitations

## 2024-10-15 NOTE — Anesthesia Postprocedure Evaluation (Signed)
"   Anesthesia Post Note  Patient: Emily [name]  Procedure(s) Performed: EGD (ESOPHAGOGASTRODUODENOSCOPY) BALLOON DILATION  Patient location during evaluation: PACU Anesthesia Type: General Level of consciousness: awake and alert Pain management: pain level controlled Vital Signs Assessment: post-procedure vital signs reviewed and stable Respiratory status: spontaneous breathing, nonlabored ventilation, respiratory function stable and patient connected to nasal cannula oxygen Cardiovascular status: blood pressure returned to baseline and stable Postop Assessment: no apparent nausea or vomiting Anesthetic complications: no   No notable events documented.   Last Vitals:  Vitals:   10/15/24 1116 10/15/24 1126  BP: 135/73 113/88  Pulse: 97 84  Resp: 18 19  Temp:    SpO2: 100% 99%    Last Pain:  Vitals:   10/15/24 1126  TempSrc:   PainSc: 0-No pain                 Shau-Shau Melia      "

## 2024-10-15 NOTE — Interval H&P Note (Signed)
 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  The various methods of treatment have been discussed with the patient and family. After consideration of risks, benefits and other options for treatment, the patient has consented to  Procedures: EGD (ESOPHAGOGASTRODUODENOSCOPY) (N/A) as a surgical intervention.  The patient's history has been reviewed, patient examined, no change in status, stable for surgery.  I have reviewed the patient's chart and labs.  Questions were answered to the patient's satisfaction.     Emily [name]  Ok to proceed with EGD

## 2024-10-15 NOTE — Transfer of Care (Signed)
 Immediate Anesthesia Transfer of Care Note  Patient: Emily [name]  Procedure(s) Performed: EGD (ESOPHAGOGASTRODUODENOSCOPY) BALLOON DILATION  Patient Location: Endoscopy Unit  Anesthesia Type:General  Level of Consciousness: drowsy and patient cooperative  Airway & Oxygen Therapy: Patient Spontanous Breathing and Patient connected to face mask oxygen  Post-op Assessment: Report given to RN and Post -op Vital signs reviewed and stable  Post vital signs: Reviewed and stable  Last Vitals:  Vitals Value Taken Time  BP 135/73 10/15/24 11:16  Temp    Pulse 86 10/15/24 11:20  Resp 23 10/15/24 11:20  SpO2 100 % 10/15/24 11:20  Vitals shown include unfiled device data.  Last Pain:  Vitals:   10/15/24 1106  TempSrc:   PainSc: 0-No pain         Complications: No notable events documented.

## 2024-11-04 ENCOUNTER — Ambulatory Visit: Admitting: Podiatry

## 2024-12-20 ENCOUNTER — Ambulatory Visit: Admitting: Podiatry
